# Patient Record
Sex: Female | Born: 1935 | Race: White | Hispanic: No | Smoking: Former smoker
Health system: Southern US, Community
[De-identification: ages and names within clinical notes are randomized; demographics above are authoritative.]

## PROBLEM LIST (undated history)

## (undated) DIAGNOSIS — I739 Peripheral vascular disease, unspecified: Secondary | ICD-10-CM

## (undated) DIAGNOSIS — J449 Chronic obstructive pulmonary disease, unspecified: Secondary | ICD-10-CM

## (undated) DIAGNOSIS — E785 Hyperlipidemia, unspecified: Secondary | ICD-10-CM

## (undated) DIAGNOSIS — I219 Acute myocardial infarction, unspecified: Secondary | ICD-10-CM

## (undated) DIAGNOSIS — I10 Essential (primary) hypertension: Secondary | ICD-10-CM

## (undated) DIAGNOSIS — I350 Nonrheumatic aortic (valve) stenosis: Secondary | ICD-10-CM

## (undated) HISTORY — DX: Essential (primary) hypertension: I10

## (undated) HISTORY — DX: Hyperlipidemia, unspecified: E78.5

## (undated) HISTORY — PX: ABDOMINAL HYSTERECTOMY: SHX81

## (undated) HISTORY — DX: Chronic obstructive pulmonary disease, unspecified: J44.9

## (undated) HISTORY — PX: CATARACT EXTRACTION: SUR2

## (undated) HISTORY — PX: FOOT SURGERY: SHX648

## (undated) HISTORY — PX: TONSILLECTOMY: SUR1361

## (undated) HISTORY — DX: Acute myocardial infarction, unspecified: I21.9

## (undated) HISTORY — DX: Peripheral vascular disease, unspecified: I73.9

## (undated) HISTORY — PX: APPENDECTOMY: SHX54

## (undated) HISTORY — PX: CAROTID ENDARTERECTOMY: SUR193

## (undated) HISTORY — DX: Nonrheumatic aortic (valve) stenosis: I35.0

---

## 2000-02-14 ENCOUNTER — Other Ambulatory Visit: Admission: RE | Admit: 2000-02-14 | Discharge: 2000-02-14 | Payer: Self-pay | Admitting: Gynecology

## 2002-09-30 ENCOUNTER — Ambulatory Visit (HOSPITAL_COMMUNITY): Admission: RE | Admit: 2002-09-30 | Discharge: 2002-09-30 | Payer: Self-pay | Admitting: Internal Medicine

## 2003-01-22 ENCOUNTER — Inpatient Hospital Stay (HOSPITAL_COMMUNITY): Admission: EM | Admit: 2003-01-22 | Discharge: 2003-01-23 | Payer: Self-pay | Admitting: Emergency Medicine

## 2003-01-22 ENCOUNTER — Encounter: Payer: Self-pay | Admitting: Internal Medicine

## 2003-01-23 ENCOUNTER — Encounter: Payer: Self-pay | Admitting: Cardiology

## 2003-03-25 ENCOUNTER — Ambulatory Visit (HOSPITAL_COMMUNITY): Admission: RE | Admit: 2003-03-25 | Discharge: 2003-03-26 | Payer: Self-pay | Admitting: *Deleted

## 2003-07-27 ENCOUNTER — Other Ambulatory Visit: Admission: RE | Admit: 2003-07-27 | Discharge: 2003-07-27 | Payer: Self-pay | Admitting: Gynecology

## 2004-07-28 ENCOUNTER — Other Ambulatory Visit: Admission: RE | Admit: 2004-07-28 | Discharge: 2004-07-28 | Payer: Self-pay | Admitting: Gynecology

## 2006-07-09 ENCOUNTER — Ambulatory Visit: Payer: Self-pay | Admitting: Vascular Surgery

## 2007-07-05 ENCOUNTER — Ambulatory Visit: Payer: Self-pay | Admitting: Vascular Surgery

## 2007-10-09 ENCOUNTER — Encounter: Admission: RE | Admit: 2007-10-09 | Discharge: 2007-10-09 | Payer: Self-pay | Admitting: Internal Medicine

## 2008-01-15 ENCOUNTER — Ambulatory Visit: Payer: Self-pay | Admitting: Vascular Surgery

## 2008-07-24 ENCOUNTER — Ambulatory Visit: Payer: Self-pay | Admitting: Vascular Surgery

## 2009-02-05 ENCOUNTER — Ambulatory Visit: Payer: Self-pay | Admitting: Vascular Surgery

## 2009-05-14 ENCOUNTER — Ambulatory Visit: Payer: Self-pay | Admitting: Vascular Surgery

## 2009-05-27 ENCOUNTER — Encounter: Payer: Self-pay | Admitting: Vascular Surgery

## 2009-05-27 ENCOUNTER — Ambulatory Visit: Payer: Self-pay | Admitting: Vascular Surgery

## 2009-05-27 ENCOUNTER — Inpatient Hospital Stay (HOSPITAL_COMMUNITY): Admission: RE | Admit: 2009-05-27 | Discharge: 2009-05-29 | Payer: Self-pay | Admitting: Vascular Surgery

## 2009-06-11 ENCOUNTER — Ambulatory Visit: Payer: Self-pay | Admitting: Vascular Surgery

## 2009-12-30 ENCOUNTER — Ambulatory Visit: Payer: Self-pay | Admitting: Vascular Surgery

## 2010-05-03 ENCOUNTER — Encounter: Payer: Self-pay | Admitting: Cardiology

## 2010-06-02 ENCOUNTER — Encounter: Payer: Self-pay | Admitting: Cardiology

## 2010-06-09 ENCOUNTER — Ambulatory Visit: Payer: Self-pay | Admitting: Cardiovascular Disease

## 2010-06-17 ENCOUNTER — Encounter: Payer: Self-pay | Admitting: Cardiology

## 2010-06-28 ENCOUNTER — Other Ambulatory Visit (INDEPENDENT_AMBULATORY_CARE_PROVIDER_SITE_OTHER): Payer: Medicare Other

## 2010-06-28 DIAGNOSIS — Z48812 Encounter for surgical aftercare following surgery on the circulatory system: Secondary | ICD-10-CM

## 2010-06-28 DIAGNOSIS — I6529 Occlusion and stenosis of unspecified carotid artery: Secondary | ICD-10-CM

## 2010-06-29 LAB — APTT: aPTT: 29 seconds (ref 24–37)

## 2010-06-29 LAB — CBC
HCT: 29.1 % — ABNORMAL LOW (ref 36.0–46.0)
HCT: 40.1 % (ref 36.0–46.0)
Hemoglobin: 13.8 g/dL (ref 12.0–15.0)
MCHC: 34.3 g/dL (ref 30.0–36.0)
MCV: 96.1 fL (ref 78.0–100.0)
Platelets: 236 10*3/uL (ref 150–400)
Platelets: 285 10*3/uL (ref 150–400)
RBC: 4.18 MIL/uL (ref 3.87–5.11)
RDW: 12 % (ref 11.5–15.5)
RDW: 12.6 % (ref 11.5–15.5)
WBC: 9.3 10*3/uL (ref 4.0–10.5)

## 2010-06-29 LAB — COMPREHENSIVE METABOLIC PANEL
ALT: 18 U/L (ref 0–35)
AST: 26 U/L (ref 0–37)
Albumin: 4.3 g/dL (ref 3.5–5.2)
Alkaline Phosphatase: 81 U/L (ref 39–117)
BUN: 15 mg/dL (ref 6–23)
CO2: 30 mEq/L (ref 19–32)
Calcium: 10.4 mg/dL (ref 8.4–10.5)
Chloride: 101 mEq/L (ref 96–112)
Creatinine, Ser: 0.85 mg/dL (ref 0.4–1.2)
GFR calc Af Amer: >60 mL/min (ref >60)
GFR calc non Af Amer: >60 mL/min (ref >60)
Glucose, Bld: 93 mg/dL (ref 70–99)
Potassium: 4.5 mEq/L (ref 3.5–5.1)
Sodium: 139 mEq/L (ref 135–145)
Total Bilirubin: 0.6 mg/dL (ref 0.3–1.2)
Total Protein: 7 g/dL (ref 6.0–8.3)

## 2010-06-29 LAB — BASIC METABOLIC PANEL
BUN: 10 mg/dL (ref 6–23)
GFR calc Af Amer: >60 mL/min (ref >60)
GFR calc non Af Amer: >60 mL/min (ref >60)
Potassium: 4 mEq/L (ref 3.5–5.1)
Sodium: 139 mEq/L (ref 135–145)

## 2010-06-29 LAB — CARDIAC PANEL(CRET KIN+CKTOT+MB+TROPI)
CK, MB: 1.7 ng/mL (ref 0.3–4.0)
Relative Index: INVALID (ref 0.0–2.5)
Total CK: 61 U/L (ref 7–177)
Troponin I: 0.01 ng/mL (ref 0.00–0.06)

## 2010-06-29 LAB — MRSA PCR SCREENING: MRSA by PCR: NEGATIVE

## 2010-06-29 LAB — URINALYSIS, ROUTINE W REFLEX MICROSCOPIC
Bilirubin Urine: NEGATIVE
Ketones, ur: NEGATIVE mg/dL
Nitrite: NEGATIVE
Protein, ur: NEGATIVE mg/dL
Urobilinogen, UA: 0.2 mg/dL (ref 0.0–1.0)

## 2010-06-29 LAB — GLUCOSE, CAPILLARY: Glucose-Capillary: 112 mg/dL — ABNORMAL HIGH (ref 70–99)

## 2010-06-29 LAB — PROTIME-INR: INR: 0.88 (ref 0.00–1.49)

## 2010-06-30 ENCOUNTER — Encounter: Payer: Self-pay | Admitting: Cardiology

## 2010-06-30 ENCOUNTER — Ambulatory Visit (INDEPENDENT_AMBULATORY_CARE_PROVIDER_SITE_OTHER): Payer: Medicare Other | Admitting: Cardiology

## 2010-06-30 VITALS — BP 142/76 | HR 74 | Resp 18 | Ht 63.0 in | Wt 112.0 lb

## 2010-06-30 DIAGNOSIS — R0602 Shortness of breath: Secondary | ICD-10-CM

## 2010-06-30 DIAGNOSIS — IMO0001 Reserved for inherently not codable concepts without codable children: Secondary | ICD-10-CM

## 2010-06-30 DIAGNOSIS — I350 Nonrheumatic aortic (valve) stenosis: Secondary | ICD-10-CM | POA: Insufficient documentation

## 2010-06-30 DIAGNOSIS — F17200 Nicotine dependence, unspecified, uncomplicated: Secondary | ICD-10-CM

## 2010-06-30 DIAGNOSIS — R0989 Other specified symptoms and signs involving the circulatory and respiratory systems: Secondary | ICD-10-CM | POA: Insufficient documentation

## 2010-06-30 DIAGNOSIS — Z720 Tobacco use: Secondary | ICD-10-CM | POA: Insufficient documentation

## 2010-06-30 DIAGNOSIS — E785 Hyperlipidemia, unspecified: Secondary | ICD-10-CM

## 2010-06-30 DIAGNOSIS — I10 Essential (primary) hypertension: Secondary | ICD-10-CM

## 2010-06-30 DIAGNOSIS — R011 Cardiac murmur, unspecified: Secondary | ICD-10-CM

## 2010-06-30 DIAGNOSIS — E119 Type 2 diabetes mellitus without complications: Secondary | ICD-10-CM | POA: Insufficient documentation

## 2010-06-30 DIAGNOSIS — I739 Peripheral vascular disease, unspecified: Secondary | ICD-10-CM

## 2010-06-30 NOTE — Patient Instructions (Addendum)
Your physician recommends that you schedule a follow-up appointment in: 4-6 weeks. Your physician has requested that you have an abdominal aorta duplex. During this test, an ultrasound is used to evaluate the aorta. Allow 30 minutes for this exam. Do not eat after midnight the day before and avoid carbonated beverages Your physician has requested that you have a lexiscan myoview. For further information please visit [email or web address] Please follow instruction sheet, as given.

## 2010-06-30 NOTE — Assessment & Plan Note (Signed)
Patient has an aortic stenosis murmur on examination. She is not having significant symptoms. She had a recent echocardiogram and I will have that forwarded to [place] for review. We will make further decisions concerning treatment and followup based on the results.

## 2010-06-30 NOTE — Assessment & Plan Note (Signed)
Most likely secondary to COPD. However multiple risk factors and previous vascular disease. Scheduled myoview.

## 2010-06-30 NOTE — Assessment & Plan Note (Addendum)
Continue aspirin and statin. Carotids followed by vascular surgery.

## 2010-06-30 NOTE — Assessment & Plan Note (Signed)
Blood pressure controlled on present medications. Will continue. 

## 2010-06-30 NOTE — Assessment & Plan Note (Signed)
Continue statin. Lipids and liver monitored by primary care. 

## 2010-06-30 NOTE — Assessment & Plan Note (Signed)
Patient counseled on discontinuing. 

## 2010-06-30 NOTE — Assessment & Plan Note (Signed)
Schedule abdominal ultrasound to exclude aneurysm. 

## 2010-06-30 NOTE — Progress Notes (Signed)
HPI: 77 year old female for evaluation of murmur. Echocardiogram in 2004 showed normal LV function, mild left atrial enlargement, mild mitral regurgitation. Carotid Dopplers in September of 2011 showed no stenosis in the right and the previous left carotid endarterectomy site was patent. Patient recently noted to have a murmur and we were asked to further evaluate. She has dyspnea on exertion which is a chronic issue and she states actually improving since decreasing her tobacco use. There is no orthopnea, PND, pedal edema, palpitations, syncope or chest pain. She apparently had an echocardiogram but those results are not available.  Current Outpatient Prescriptions  Medication Sig Dispense Refill  . albuterol (PROVENTIL HFA;VENTOLIN HFA) 108 (90 BASE) MCG/ACT inhaler Inhale 2 puffs into the lungs every 6 (six) hours as needed.        Marland Kitchen aspirin 325 MG tablet Take 325 mg by mouth daily.        Marland Kitchen atorvastatin (LIPITOR) 10 MG tablet Take 10 mg by mouth daily.        . Calcium Carbonate (CALTRATE 600 PO) Take by mouth. 1 po daily       . HYDROcodone-acetaminophen (NORCO) 7.5-325 MG per tablet Take 1 tablet by mouth every 6 (six) hours as needed.        Marland Kitchen LORazepam (ATIVAN) 0.5 MG tablet Take 0.5 mg by mouth every 6 (six) hours as needed.        . Lutein 20 MG CAPS Take by mouth. 2 po daily       . Multiple Vitamin (MULTIVITAMIN) capsule Take 1 capsule by mouth daily.        . Multiple Vitamins-Minerals (PRESERVISION AREDS PO) Take by mouth. 2 po daily       . Nebulizer MISC by Does not apply route. PRN       . olmesartan-hydrochlorothiazide (BENICAR HCT) 40-25 MG per tablet 1/2 po dialy       . Omega-3 Fatty Acids (FISH OIL) 1000 MG CAPS Take by mouth. 1 po daily         No Known Allergies  Past Medical History  Diagnosis Date  . HTN (hypertension)   . HLD (hyperlipidemia)   . Diabetes mellitus   . COPD (chronic obstructive pulmonary disease)   . PVD (peripheral vascular disease)     Past  Surgical History  Procedure Date  . Appendectomy   . Abdominal hysterectomy   . Cataract extraction   . Foot surgery     right  . Carotid endarterectomy     Left  . Tonsillectomy     History   Social History  . Marital Status: Widowed    Spouse Name: N/A    Number of Children: N/A  . Years of Education: N/A   Occupational History  . Retired    Social History Main Topics  . Smoking status: Current Everyday Smoker -- 0.5 packs/day for 60 years    Types: Cigarettes  . Smokeless tobacco: Not on file  . Alcohol Use: No  . Drug Use: No  . Sexually Active: Not on file   Other Topics Concern  . Not on file   Social History Narrative  . No narrative on file    Family History  Problem Relation Age of Onset  . Coronary artery disease Father   . Coronary artery disease Brother 23  . Coronary artery disease Brother 62    ROS: Arthralgias but no fevers or chills, productive cough, hemoptysis, dysphasia, odynophagia, melena, hematochezia, dysuria, hematuria, rash, seizure activity, orthopnea,  PND, pedal edema, claudication. Remaining systems are negative.  Physical Exam: General:  Well developed/frail in NAD Skin warm/dry Patient not depressed No peripheral clubbing Back-normal HEENT-normal/normal eyelids Neck supple/normal carotid upstroke bilaterally; bilateral bruits; no JVD; no thyromegaly chest - Mild exp wheeze; increased AP diameter CV - RRR/normal S1 and S2; no rubs or gallops;  PMI nondisplaced; 3/6 Systolic murmur Abdomen -NT/ND, no HSM, no mass, + bowel sounds, positive bruit 2+ femoral pulses, no bruits Ext-no edema, chords, 2+ DP Neuro-grossly nonfocal  ECG Normal sinus rhythm at a rate of 74. Normal axis. Cannot rule out prior septal infarct.

## 2010-07-05 ENCOUNTER — Encounter: Payer: Self-pay | Admitting: Cardiology

## 2010-07-05 NOTE — Procedures (Unsigned)
CAROTID DUPLEX EXAM  INDICATION:  Left carotid endarterectomy.  HISTORY: Diabetes:  No Cardiac:  No Hypertension:  Yes Smoking:  Yes Previous Surgery:  Left carotid endarterectomy on 05/27/2009 CV History:  Currently asymptomatic Amaurosis Fugax No, Paresthesias No, Hemiparesis No                                      RIGHT             LEFT Brachial systolic pressure:         172               170 Brachial Doppler waveforms:         Normal            Normal Vertebral direction of flow:        Antegrade         Antegrade DUPLEX VELOCITIES (cm/sec) CCA peak systolic                   71                65 ECA peak systolic                   95                94 ICA peak systolic                   108               92 ICA end diastolic                   24                27 PLAQUE MORPHOLOGY:                  Heterogeneous PLAQUE AMOUNT:                      Mild              None PLAQUE LOCATION:                    ICA/CCA  IMPRESSION: 1. No hemodynamically significant stenosis of the right internal     carotid artery, with plaque formations as described above. 2. Patent left carotid endarterectomy site with no evidence of     stenosis. 3. No significant change noted when compared to the previous exam on     12/30/2009.  ___________________________________________ Larina Earthly, M.D.  CH/MEDQ  D:  06/30/2010  T:  06/30/2010  Job:  [id number]

## 2010-07-20 ENCOUNTER — Encounter: Payer: Medicare Other | Admitting: Cardiology

## 2010-07-20 ENCOUNTER — Other Ambulatory Visit (HOSPITAL_COMMUNITY): Payer: Medicare Other | Admitting: Radiology

## 2010-07-28 ENCOUNTER — Other Ambulatory Visit (HOSPITAL_COMMUNITY): Payer: Medicare Other | Admitting: Radiology

## 2010-07-28 ENCOUNTER — Encounter: Payer: Medicare Other | Admitting: Cardiology

## 2010-08-08 ENCOUNTER — Ambulatory Visit: Payer: Medicare Other | Admitting: Cardiology

## 2010-08-09 ENCOUNTER — Telehealth: Payer: Self-pay | Admitting: Cardiology

## 2010-08-09 DIAGNOSIS — R011 Cardiac murmur, unspecified: Secondary | ICD-10-CM

## 2010-08-09 NOTE — Telephone Encounter (Signed)
Pt having problems with bronchitis, was put on 5 day treatment of prednisone starting today, has stress test Tomorrow, can she still have this done

## 2010-08-09 NOTE — Telephone Encounter (Signed)
Pt coughing a lot and doesn't feel well.  Transferred call to Nuc Med dept to re-shchedule appt.

## 2010-08-10 ENCOUNTER — Encounter: Payer: Medicare Other | Admitting: Cardiology

## 2010-08-10 ENCOUNTER — Other Ambulatory Visit (HOSPITAL_COMMUNITY): Payer: Medicare Other | Admitting: Radiology

## 2010-08-16 ENCOUNTER — Ambulatory Visit: Payer: Medicare Other | Admitting: Cardiology

## 2010-08-17 ENCOUNTER — Ambulatory Visit (HOSPITAL_COMMUNITY): Payer: Medicare Other | Attending: Cardiology | Admitting: Radiology

## 2010-08-17 ENCOUNTER — Encounter (INDEPENDENT_AMBULATORY_CARE_PROVIDER_SITE_OTHER): Payer: Medicare Other | Admitting: Cardiology

## 2010-08-17 VITALS — Ht 63.0 in | Wt 109.0 lb

## 2010-08-17 DIAGNOSIS — I10 Essential (primary) hypertension: Secondary | ICD-10-CM | POA: Insufficient documentation

## 2010-08-17 DIAGNOSIS — R0989 Other specified symptoms and signs involving the circulatory and respiratory systems: Secondary | ICD-10-CM

## 2010-08-17 DIAGNOSIS — F17200 Nicotine dependence, unspecified, uncomplicated: Secondary | ICD-10-CM | POA: Insufficient documentation

## 2010-08-17 DIAGNOSIS — I700 Atherosclerosis of aorta: Secondary | ICD-10-CM

## 2010-08-17 DIAGNOSIS — R0602 Shortness of breath: Secondary | ICD-10-CM

## 2010-08-17 DIAGNOSIS — I739 Peripheral vascular disease, unspecified: Secondary | ICD-10-CM | POA: Insufficient documentation

## 2010-08-17 DIAGNOSIS — I714 Abdominal aortic aneurysm, without rupture: Secondary | ICD-10-CM

## 2010-08-17 DIAGNOSIS — Z8249 Family history of ischemic heart disease and other diseases of the circulatory system: Secondary | ICD-10-CM | POA: Insufficient documentation

## 2010-08-17 DIAGNOSIS — R0600 Dyspnea, unspecified: Secondary | ICD-10-CM

## 2010-08-17 DIAGNOSIS — I779 Disorder of arteries and arterioles, unspecified: Secondary | ICD-10-CM | POA: Insufficient documentation

## 2010-08-17 DIAGNOSIS — R42 Dizziness and giddiness: Secondary | ICD-10-CM | POA: Insufficient documentation

## 2010-08-17 DIAGNOSIS — R0609 Other forms of dyspnea: Secondary | ICD-10-CM | POA: Insufficient documentation

## 2010-08-17 MED ORDER — TECHNETIUM TC 99M TETROFOSMIN IV KIT
11.0000 | PACK | Freq: Once | INTRAVENOUS | Status: AC | PRN
  Administered 2010-08-17: 11 via INTRAVENOUS

## 2010-08-17 MED ORDER — TECHNETIUM TC 99M TETROFOSMIN IV KIT
33.0000 | PACK | Freq: Once | INTRAVENOUS | Status: AC | PRN
  Administered 2010-08-17: 33 via INTRAVENOUS

## 2010-08-17 MED ORDER — REGADENOSON 0.4 MG/5ML IV SOLN
0.4000 mg | Freq: Once | INTRAVENOUS | Status: AC
  Administered 2010-08-17: 0.4 mg via INTRAVENOUS

## 2010-08-17 NOTE — Progress Notes (Signed)
[facility] SITE 3 NUCLEAR MED [address] [place] [address] [phone]  Cardiology Nuclear Med Study  Denise Adams is a 77 y.o. female [id number] 01/19/1932   Nuclear Med Background Indication for Stress Test:  Evaluation for Ischemia History: '04 Echo: NL LVF mild MR, AS Cardiac Risk Factors: Carotid Disease, Family History - CAD, Hypertension, Lipids, PVD and Smoker  Symptoms:  Dizziness, DOE and Light-Headedness   Nuclear Pre-Procedure Caffeine/Decaff Intake:  None NPO After: 8:00pm   Lungs:  Clear with minimal rhonchi IV 0.9% NS with Angio Cath:  18g  IV Site: R Antecubital  IV Started by:  Stanton Kidney, EMT-P  Chest Size (in):  34 Cup Size: C  Height: 5\' 3"  (1.6 m)  Weight:  109 lb (49.442 kg)  BMI:  Body mass index is 19.31 kg/(m^2). Tech Comments:  NA    Nuclear Med Study 1 or 2 day study: 1 day  Stress Test Type:  Eugenie Birks  Reading MD: Marca Ancona, MD  Order Authorizing Provider:  B.Crenshaw  Resting Radionuclide: Technetium 58m Tetrofosmin  Resting Radionuclide Dose: 11 mCi   Stress Radionuclide:  Technetium 51m Tetrofosmin  Stress Radionuclide Dose: 33 mCi           Stress Protocol Rest HR: 71 Stress HR: 112  Rest BP: 155/62 Stress BP: 153/62  Exercise Time (min): n/a METS: n/a   Predicted Max HR: 144 bpm % Max HR: 77.78 bpm Rate Pressure Product: 56433   Dose of Adenosine (mg):  n/a Dose of Lexiscan: 0.4 mg  Dose of Atropine (mg): n/a Dose of Dobutamine: n/a mcg/kg/min (at max HR)  Stress Test Technologist: Milana Na, EMT-P  Nuclear Technologist:  Domenic Polite, CNMT     Rest Procedure:  Myocardial perfusion imaging was performed at rest 45 minutes following the intravenous administration of Technetium 39m Tetrofosmin. Rest ECG: NSR  Stress Procedure:  The patient received IV Lexiscan 0.4 mg over 15-seconds.  Technetium 75m Tetrofosmin injected at 30-seconds.  There were no significant changes with Lexiscan.   Quantitative spect images were obtained after a 45 minute delay. Stress ECG: No significant change from baseline ECG  QPS Raw Data Images:  Normal; no motion artifact; normal heart/lung ratio. Stress Images:  Small apical perfusion defect.  Rest Images:  Normal homogeneous uptake in all areas of the myocardium. Subtraction (SDS):  Small reversible apical perfusion defect.  Transient Ischemic Dilatation (Normal <1.22):  1.10 Lung/Heart Ratio (Normal <0.45):  0.37  Quantitative Gated Spect Images QGS EDV:  139 ml QGS ESV:  70 ml QGS cine images:  Normal Wall Motion QGS EF: 76%  Impression Exercise Capacity:  Lexiscan with no exercise. BP Response:  Normal blood pressure response. Clinical Symptoms:  Chest tightness and nausea.  ECG Impression:  No significant ST segment change suggestive of ischemia. Comparison with Prior Nuclear Study: No images to compare  Overall Impression:  Small reversible apical perfusion defect.  Normal LV systolic function.  Overall low risk: shifting breast attenuation versus small area of ischemia.   Dalton [employer]

## 2010-08-18 ENCOUNTER — Encounter: Payer: Self-pay | Admitting: Cardiology

## 2010-08-18 NOTE — Progress Notes (Signed)
COPY ROUTED TO DR. CRENSHAW

## 2010-08-23 ENCOUNTER — Ambulatory Visit (INDEPENDENT_AMBULATORY_CARE_PROVIDER_SITE_OTHER): Payer: Medicare Other | Admitting: Cardiology

## 2010-08-23 ENCOUNTER — Encounter: Payer: Self-pay | Admitting: Cardiology

## 2010-08-23 VITALS — BP 134/68 | HR 78 | Ht 63.0 in | Wt 107.6 lb

## 2010-08-23 DIAGNOSIS — R9430 Abnormal result of cardiovascular function study, unspecified: Secondary | ICD-10-CM | POA: Insufficient documentation

## 2010-08-23 DIAGNOSIS — I739 Peripheral vascular disease, unspecified: Secondary | ICD-10-CM

## 2010-08-23 DIAGNOSIS — Z720 Tobacco use: Secondary | ICD-10-CM

## 2010-08-23 DIAGNOSIS — I714 Abdominal aortic aneurysm, without rupture, unspecified: Secondary | ICD-10-CM | POA: Insufficient documentation

## 2010-08-23 DIAGNOSIS — F17200 Nicotine dependence, unspecified, uncomplicated: Secondary | ICD-10-CM

## 2010-08-23 DIAGNOSIS — E7800 Pure hypercholesterolemia, unspecified: Secondary | ICD-10-CM

## 2010-08-23 MED ORDER — PRAVASTATIN SODIUM 40 MG PO TABS: 40.0000 mg | ORAL_TABLET | Freq: Every evening | ORAL | Status: DC

## 2010-08-23 NOTE — Assessment & Plan Note (Signed)
OFFICE VISIT   Denise Adams, Denise Adams  DOB:  Sep 08, 1931                                       02/05/2009  ZOXWR#:[id number]   Patient presents today for continued follow-up of her asymptomatic  carotid disease.  She specifically denies any new major medical  difficulties.  Specifically, no amaurosis fugax, transient ischemic  attack, or stroke.  She has stable treatment of her diabetes,  hypertension, and elevated cholesterol.  She does have a recent  worsening of her chronic bronchitis due to a probable upper respiratory  infection.   Her social history is unchanged.  She continues to smoke a pack of  cigarettes a day.  Does not drink alcohol on a regular basis.   Review of systems is positive for shortness of breath with exertion from  a cardiac standpoint and no history of weight loss or weight gain.  Pulmonary:  Does have productive cough and wheezing.  GI:  Negative.  GU:  Negative.  Vascular:  She does have pain in her legs with walking.  She denies any neurologic deficits.  She does have arthritic joint pain.  No psychiatric change.  No hematologic disorders.   Her medication list is on her chart.   PHYSICAL EXAMINATION:  A well-developed and well-nourished white female  in no acute distress, grossly intact neurologically.  Her radial pulses  are 2+ bilaterally.  She does have a harsh left carotid bruit, no bruit  on the right.  She is intact neurologically.   She underwent repeat carotid duplex today, and this shows progression to  a severe level of stenosis in her left internal carotid artery.  She is  right-handed.   I discussed this at length with patient and her family present.  I  explained the significance of her severe asymptomatic stenosis and have  suggested elective carotid endarterectomy.  She reports that she is  having a family member visit from the [employer] after being absent for 4  years in mid December and wishes to defer until  after this.  I explained  that annual risk of stroke or transient ischemic attack would be  approximately 5% with the severe asymptomatic carotid stenosis.  I  explained the symptoms to her, and she will notify me should this occur,  otherwise we will see her again the first of the year for further  discussion.   Larina Earthly, M.D.  Electronically Signed   TFE/MEDQ  D:  02/05/2009  T:  02/08/2009  Job:  [id number]   cc:   Larina Earthly, M.D.

## 2010-08-23 NOTE — Progress Notes (Signed)
HPI: Pleasant female I saw in March of 2012 for evaluation of murmur. Echocardiogram in 2004 showed normal LV function, mild left atrial enlargement, mild mitral regurgitation. Carotid Dopplers in September of 2011 showed no stenosis in the right and the previous left carotid endarterectomy site was patent. Echocardiogram in January of 2012 showed normal LV function, moderate aortic stenosis with a mean gradient of 27 mmHg, mild aortic insufficiency, mild to moderate tricuspid regurgitation. Patient had a Myoview in May 2012. Ejection fraction was 76%. There was a small reversible apical defect felt to be either mild ischemia or shifting breast attenuation. Abdominal ultrasound in May of 2012 showed mild dilatation measuring 2.6 x 2.7 cm. There was severe in-stent restenosis of the right iliac stent. ABIs and clinical correlation recommended. Since I last saw her, she continues to have dyspnea on exertion but there is no orthopnea, PND or pedal edema. She has not had exertional chest pain. There is no syncope. She has mild discomfort in her legs bilaterally with walking up a hill.   Current Outpatient Prescriptions  Medication Sig Dispense Refill  . albuterol (PROVENTIL HFA;VENTOLIN HFA) 108 (90 BASE) MCG/ACT inhaler Inhale 2 puffs into the lungs every 6 (six) hours as needed.        Marland Kitchen aspirin 325 MG tablet Take 325 mg by mouth daily.        . Calcium Carbonate (CALTRATE 600 PO) Take by mouth. 1 po daily       . cyanocobalamin 1000 MCG tablet Take 100 mcg by mouth daily.        Marland Kitchen HYDROcodone-acetaminophen (NORCO) 7.5-325 MG per tablet Take 1 tablet by mouth every 6 (six) hours as needed.        Marland Kitchen LORazepam (ATIVAN) 0.5 MG tablet Take 0.5 mg by mouth every 6 (six) hours as needed.        . Lutein 20 MG CAPS Take 45 mg by mouth daily. 2 po daily      . Multiple Vitamin (MULTIVITAMIN) capsule Take 1 capsule by mouth daily.        . Multiple Vitamins-Minerals (PRESERVISION AREDS PO) Take by mouth. 2 po  daily       . Nebulizer MISC by Does not apply route. PRN       . olmesartan-hydrochlorothiazide (BENICAR HCT) 40-25 MG per tablet 1/2 po dialy       . Omega-3 Fatty Acids (FISH OIL) 1000 MG CAPS Take by mouth. 1 po daily.  Mega [employer] Kr[employer] Oil      . DISCONTD: atorvastatin (LIPITOR) 10 MG tablet Take 10 mg by mouth daily.           Past Medical History  Diagnosis Date  . HTN (hypertension)   . HLD (hyperlipidemia)   . Diabetes mellitus   . COPD (chronic obstructive pulmonary disease)   . PVD (peripheral vascular disease)   . Aortic stenosis     Past Surgical History  Procedure Date  . Appendectomy   . Abdominal hysterectomy   . Cataract extraction   . Foot surgery     right  . Carotid endarterectomy     Left  . Tonsillectomy     History   Social History  . Marital Status: Widowed    Spouse Name: N/A    Number of Children: N/A  . Years of Education: N/A   Occupational History  . Retired    Social History Main Topics  . Smoking status: Current Everyday Smoker -- 0.5 packs/day for 60  years    Types: Cigarettes  . Smokeless tobacco: Never Used  . Alcohol Use: No  . Drug Use: No  . Sexually Active: Not on file   Other Topics Concern  . Not on file   Social History Narrative  . No narrative on file    ROS: Problems with arthralgias and allergies but no fevers or chills, productive cough, hemoptysis, dysphasia, odynophagia, melena, hematochezia, dysuria, hematuria, rash, seizure activity, orthopnea, PND, pedal edema. Remaining systems are negative.  Physical Exam: Well-developed well-nourished in no acute distress.  Skin is warm and dry.  HEENT is normal.  Neck is supple. No thyromegaly.  Chest is diffuse rhonchi Cardiovascular exam is regular rate and rhythm. 3/6 systolic murmur left sternal border. Abdominal exam nontender or distended. No masses palpated. Extremities show no edema. neuro grossly intact

## 2010-08-23 NOTE — Procedures (Signed)
CAROTID DUPLEX EXAM   INDICATION:  Followup carotid artery disease.   HISTORY:  Diabetes:  Yes.  Cardiac:  No.  Hypertension:  Yes.  Smoking:  Yes.  Previous Surgery:  No carotid surgery.  CV History:  No.  Amaurosis Fugax No, Paresthesias No, Hemiparesis No                                       RIGHT             LEFT  Brachial systolic pressure:         142               140  Brachial Doppler waveforms:         Biphasic          Biphasic  Vertebral direction of flow:        Antegrade         Antegrade  DUPLEX VELOCITIES (cm/sec)  CCA peak systolic                   94                90  ECA peak systolic                   112               125  ICA peak systolic                   116               274  ICA end diastolic                   44                75  PLAQUE MORPHOLOGY:                  Calcified         Calcified  PLAQUE AMOUNT:                      Mild              Moderate/severe  PLAQUE LOCATION:                    ICA/bifurcation   ICA/bifurcation   IMPRESSION:  1. Right ICA shows evidence of 40-59% stenosis (low end of range)      which is an increase in category, however, minimal velocity change.  2. Left ICA shows evidence of 60-79% stenosis, showing no significant      change from previous study.      ___________________________________________  Larina Earthly, M.D.   AS/MEDQ  D:  01/15/2008  T:  01/15/2008  Job:  [id number]

## 2010-08-23 NOTE — Patient Instructions (Signed)
Your physician wants you to follow-up in: 6 MONTHS You will receive a reminder letter in the mail two months in advance. If you don't receive a letter, please call our office to schedule the follow-up appointment.   Your physician has requested that you have a lower extremity arterial duplex. This test is an ultrasound of the arteries in the legs or arms. It looks at arterial blood flow in the legs and arms. Allow one hour for Lower and Upper Arterial scans. There are no restrictions or special instructions   START PRAVASTATIN 40 MG ONCE DAILY AT BEDTIME  Your physician recommends that you return for a FASTING lipid profile: 6 WEEKS=LAST WEEK OF [place]

## 2010-08-23 NOTE — Procedures (Signed)
CAROTID DUPLEX EXAM   INDICATION:  Carotid disease.   HISTORY:  Diabetes:  No.  Cardiac:  No.  Hypertension:  Yes.  Smoking:  Yes.  Previous Surgery:  No.  CV History:  Asymptomatic.  Amaurosis Fugax No, Paresthesias No, Hemiparesis No                                       RIGHT             LEFT  Brachial systolic pressure:         139               146  Brachial Doppler waveforms:         Normal            Normal  Vertebral direction of flow:        Antegrade         Antegrade  DUPLEX VELOCITIES (cm/sec)  CCA peak systolic                   91                85  ECA peak systolic                   108               103  ICA peak systolic                   124               217  ICA end diastolic                   31                69  PLAQUE MORPHOLOGY:                  Mixed             Mixed  PLAQUE AMOUNT:                      Mild              Moderate  PLAQUE LOCATION:                    ICA/ECA/CCA       ICA/ECA/CCA   IMPRESSION:  1. 40-59% stenosis of the right internal carotid artery.  2. 60-79% stenosis of the left internal carotid artery.  3. No significant change noted when compared to the previous exam on      01/15/2008.   ___________________________________________  Larina Earthly, M.D.   CH/MEDQ  D:  07/24/2008  T:  07/24/2008  Job:  [id number]

## 2010-08-23 NOTE — Procedures (Signed)
CAROTID DUPLEX EXAM   INDICATION:  Follow up carotid artery disease.   HISTORY:  Diabetes:  Yes.  Cardiac:  No.  Hypertension:  Yes.  Smoking:  Yes.  Previous Surgery:  No carotid surgery.  CV History:  Asymptomatic.  Amaurosis Fugax No, Paresthesias No, Hemiparesis No                                       RIGHT             LEFT  Brachial systolic pressure:         140               130  Brachial Doppler waveforms:         Biphasic          Biphasic  Vertebral direction of flow:        Antegrade         Antegrade  DUPLEX VELOCITIES (cm/sec)  CCA peak systolic                   95                73  ECA peak systolic                   99                108  ICA peak systolic                   101               280  ICA end diastolic                   31                89  PLAQUE MORPHOLOGY:                  Calcified         Calcified  PLAQUE AMOUNT:                      Mild              Moderate/severe  PLAQUE LOCATION:                    ICA               ICA   IMPRESSION:  1. Right ICA stenosis of 20-39%.  2. Left ICA stenosis of 60-79%, showing an increase from previous      study.  3. Dr. Arbie Cookey was notified.     ___________________________________________  Larina Earthly, M.D.   AS/MEDQ  D:  07/05/2007  T:  07/05/2007  Job:  [id number]

## 2010-08-23 NOTE — Procedures (Signed)
CAROTID DUPLEX EXAM   INDICATION:  Followup left carotid endarterectomy.   HISTORY:  Diabetes:  No.  Cardiac:  No.  Hypertension:  Yes.  Smoking:  Yes.  Previous Surgery:  Left carotid endarterectomy on 05/27/2009.  CV History:  Currently asymptomatic.  Amaurosis Fugax No, Paresthesias No, Hemiparesis No                                       RIGHT             LEFT  Brachial systolic pressure:         144               140  Brachial Doppler waveforms:         Normal            Normal  Vertebral direction of flow:        Antegrade         Antegrade  DUPLEX VELOCITIES (cm/sec)  CCA peak systolic                   87                71  ECA peak systolic                   87                88  ICA peak systolic                   89                85  ICA end diastolic                   22                26  PLAQUE MORPHOLOGY:                  Heterogeneous  PLAQUE AMOUNT:                      Mild              None  PLAQUE LOCATION:                    ICA / CCA   IMPRESSION:  1. No hemodynamically significant stenosis of the right internal      carotid artery with a mild plaque formation noted.  2. Patent left carotid endarterectomy site with no left internal      carotid artery stenosis.  3. Significant improvement of left internal carotid artery Doppler      velocity when compared to the previous exam on 02/05/2009 with the      right internal carotid artery remaining stable.   ___________________________________________  Larina Earthly, M.D.   CH/MEDQ  D:  12/30/2009  T:  12/30/2009  Job:  [id number]

## 2010-08-23 NOTE — Assessment & Plan Note (Signed)
Check ABIs. We'll most likely managed medically as her symptoms are limited to walking up hills at present.

## 2010-08-23 NOTE — Assessment & Plan Note (Signed)
Lipitor discontinued for financial reasons. We'll add Pravachol 40 mg daily and check lipids and liver in 6 weeks.

## 2010-08-23 NOTE — Assessment & Plan Note (Signed)
OFFICE VISIT   Denise Adams, Denise Adams  DOB:  1931-05-04                                       06/11/2009  ZOXWR#:[id number]   The patient presents today for followup of her recent left carotid  endarterectomy and Dacron patch angioplasty for severe asymptomatic  internal carotid stenosis.  She did stay an additional day in the  hospital due to fluctuation in her blood pressure.  She had no  neurologic deficits and was discharged to home.  She continues to do  quite well.  She reports some mild peri-incisional soreness and headache  but no focal deficits.  Her incision is healing quite nicely.  She has  no bruit on the left side.  She is instructed to return to full activity  without limitation and plan to see her again in 6 months with repeat  carotid duplex at that time.     Larina Earthly, M.D.  Electronically Signed   TFE/MEDQ  D:  06/11/2009  T:  06/14/2009  Job:  [id number]   cc:   Larina Earthly, M.D.

## 2010-08-23 NOTE — Assessment & Plan Note (Signed)
Repeat abdominal ultrasound May 2013.

## 2010-08-23 NOTE — Assessment & Plan Note (Signed)
No symptoms at present. Followup echocardiogram January 2013.

## 2010-08-23 NOTE — Assessment & Plan Note (Signed)
Myoview low risk. No symptoms. Medical therapy for now.

## 2010-08-23 NOTE — Assessment & Plan Note (Signed)
OFFICE VISIT   Denise Adams, Denise Adams  DOB:  1931/05/25                                       05/14/2009  YQMVH#:[id number]   The patient presents today for continued followup of her asymptomatic  severe left internal carotid artery stenosis.  She had been seen in  October 2010 with a duplex showing progression to critical level of  stenosis in her left internal carotid artery.  She had been followed for  approximately 3-4 years with progressive changes.  She had no neurologic  deficits.  At that time, we had recommended endarterectomy, but she had  a family member coming in from out of town over the holidays and wished  to defer.  I am seeing her today for further discussion.  She has had no  change in her overall medical condition.  She does have no symptoms  specifically of amaurosis fugax, transient ischemic attack, or stroke.  She does have stable treatment of her hypertension, diabetes, and  elevated cholesterol.  She does have chronic bronchitis due to chronic  COPD.  She unfortunately does continue to smoke 1 pack of cigarettes per  day.  She is widowed.  She does take 1 aspirin per day.   PHYSICAL EXAM:  Well-developed, well-nourished, white female appearing  stated age of 73.  Her blood pressure is 164/76, respirations 18, heart  rate 92, temperature is 97.6.  She is in no acute distress and is well  nourished.  HEENT is normal.  Lungs:  Clear bilaterally with no rales,  rhonchi, or wheezes.  Cardiovascular:  Her carotid does reveal left  carotid bruit.  Heart is regular rate and rhythm.  She does have 2+  radial and 2+ dorsalis pedis pulses bilaterally.  Musculoskeletal:  No  major deformities or cyanosis.  Neurologic:  No focal weakness or  paresthesias.  Skin:  No ulcers or rashes.   I reviewed her duplex with her, again recommending endarterectomy for  reduction of stroke risk.  I explained the procedure to include possible  stroke with surgery  and also possibility of neurologic cranial nerve  injury.  I explained that the risk of this should be in the 1% to 2%  range.  She understands and wishes to proceed with surgery.  We will  schedule this on 2/17 at [facility].     Larina Earthly, M.D.  Electronically Signed   TFE/MEDQ  D:  05/14/2009  T:  05/17/2009  Job:  [id number]

## 2010-08-23 NOTE — Assessment & Plan Note (Signed)
Patient counseled on discontinuing. 

## 2010-08-23 NOTE — Procedures (Signed)
CAROTID DUPLEX EXAM   INDICATION:  Followup of carotid disease.   HISTORY:  Diabetes:  no  Cardiac:  no  Hypertension:  yes  Smoking:  yes  Previous Surgery:  no  CV History:  asymptomatic  Amaurosis Fugax No, Paresthesias No, Hemiparesis No                                       RIGHT             LEFT  Brachial systolic pressure:         110               114  Brachial Doppler waveforms:         triphasic         triphasic  Vertebral direction of flow:        antegrade         antegrade  DUPLEX VELOCITIES (cm/sec)  CCA peak systolic                   110               58  ECA peak systolic                   108               128  ICA peak systolic                   116               368  ICA end diastolic                   49                121  PLAQUE MORPHOLOGY:                  mixed             calcified  PLAQUE AMOUNT:                      mild              severe  PLAQUE LOCATION:                    CCA, ICA          ICA, CCA   IMPRESSION:  1. 80-99% stenosis of the left internal carotid artery.  2. 20-39% stenosis noted on the right internal carotid artery.        ___________________________________________  Larina Earthly, M.D.   CJ/MEDQ  D:  02/05/2009  T:  02/06/2009  Job:  [id number]

## 2010-08-23 NOTE — Assessment & Plan Note (Signed)
Blood pressure controlled. Continue present medications. 

## 2010-08-24 NOTE — Progress Notes (Signed)
pt aware of results, follow up appt made. Echo results under procedures in pt chart Denise Adams

## 2010-08-26 NOTE — H&P (Signed)
NAME:  Denise Adams, Denise Adams                     ACCOUNT NO.:  [id number]   MEDICAL RECORD NO.:  [id number]                   PATIENT TYPE:  INP   LOCATION:  2028                                 FACILITY:  [facility]   PHYSICIAN:  Mark A. Perini, M.D.                DATE OF BIRTH:  12-08-1931   DATE OF ADMISSION:  01/22/2003  DATE OF DISCHARGE:                                HISTORY & PHYSICAL   CHIEF COMPLAINT:  Acute onset of right arm pain and numbness.   HISTORY OF PRESENT ILLNESS:  Ms. Bacorn is a 77 year old Caucasian female  with a past medical history significant for significant hyperlipidemia,  hypertension, tobacco abuse, family history of coronary artery disease,  COPD, and Type II diabetes mellitus who was in her usual state of health  until today.  This morning she developed the acute onset of right hand  numbness and tingling, and it felt like it was going to sleep.  There was no  definite weakness, but it did  feel numb.  This lasted approximately 20  minutes and then eased off.  It also did involve some right arm discomfort  between the elbow and shoulder.  She was brushing her hair when this  happened.  Thirty to 45 minutes later the symptoms returned when she was  sitting at rest.  This time it felt more numb and was not burning so much.  It involved the right arm and right forearm, and there was an aching  feeling.  This eased off and she decided to call our office.  She was set up  for a visit.  In our waiting room she had several more brief episodes of  similar symptoms.  She denies any recent fevers or chills.  She denies any  chest pains, exertional or otherwise.  She denies any new edema or new  shortness of breath or wheezing.  She denies any palpitations.  She stills  smokes a half pack of cigarettes a day.  She does have a history of right  rotator cuff tear and had a steroid shot in that area in March 2004.  She  just recently resumed her Lipitor when a recent  cholesterol check showed  severe elevation of her LDL.  She has no previous history of coronary  disease or stroke.  She did have a right foot procedure performed eight  weeks ago.   In the office there were no significant findings on exam and EKG was  relatively unremarkable; however, given her significant risk factors for  vascular disease it was felt that she should be admitted for further  evaluation and treatment.   PAST MEDICAL HISTORY:  1. Tobacco abuse.  2. Type II diabetes mellitus since 2001.  3. Hypertension.  4. Hyperlipidemia.  5. Previous right shoulder procedure.  6. Bronchitis in 1974.  7. COPD.  8. Partial hysterectomy in 1967.  9. Positive family history for early  coronary disease.  10.      Osteoporosis.   SOCIAL HISTORY:  The patient is married for over 43 years.  Her husband is  supportive.  She does have a daughter that lives with her with cerebral  palsy and she is very concerned for her daughter's welfare.  She is retired.  She denies any alcohol or drug use.   FAMILY HISTORY:  Father died at age 58 of coronary disease and MI.  Mother  died at age 39 of cerebrovascular disease and diabetes.  She had a brother  who died at age 71 of coronary disease, a living sister with diabetes and  another brother died of a gunshot wound.  The patient has three daughters;  one with diabetes.   There is no history of any previous cardiac evaluation or vascular  evaluation noted.   PHYSICAL EXAMINATION:  VITAL SIGNS:  Temperature; afebrile.  Blood pressure  126/62, pulse 68 wand weight 140 pounds.  GENERAL APPEARANCE:  The patient is in no acute distress.  HEENT:  Normocephalic and atraumatic.  She smells of smoke.  She has no  icterus.  Pupils are equally round and react to light.  Oropharynx is clear.  NECK:  There is no thyromegaly.  No carotid bruits.  No JVD.  LUNGS:  Lungs are clear to auscultation bilaterally with no wheezes, rales  or rhonchi.  HEART:  Heart  has a regular rate and rhythm with a 1-2/6 murmur at the left  sternal border in systole.  There is no peripheral edema.  ABDOMEN:  Abdominal exam is negative.  NEUROLOGIC EXAMINATION:  The patient is neurologically intact wit normal  deep tendon reflexes in the upper and lower extremities.  There is no clear  weakness on neurologic testing noted.   LABORATORY DATA:  Pending.   ALLERGIES:  No known drug allergies.   MEDICATIONS:  Current medications include:  1. Lipitor 10 mg each H.S.  2. HCTZ 12.5 mg daily.  3. Albuterol MDI as needed.  4. Synthroid was stopped two months ago.  5. The patient uses Flonase or Rhinocort Aqua as needed.  6. Clarinex as needed.  7. Protonix 40 mg as needed.  8. Pepcid as needed.  9. Tylenol as needed.  10.      Darvocet as needed.  11.      Histinex as needed.  12.      The patient stopped Co-enzyme Q10 two months ago.  13.      The patient takes aspirin 325 mg daily.  14.      Albuterol nebulizer as needed.  15.      Centrum Silver daily.   ASSESSMENT/PLAN:  [age]-year-old female with multiple cardiovascular  risk factors who presents with acute onset of right arm pain and numbness.  This could all be an orthopedic problem related to neck degenerative disease  or shoulder pathology, or even possibly to carpal tunnel syndrome; however,  given the acute onset and pattern, and her multiple risk factors I believe  that she needs to be ruled out for a vascular cause such as ischemic cardiac  condition or transient ischemic attacks.   The patient will be admitted to a telemetry bed.  She will have oxygen and  IV access.  We will obtain admission laboratory data and chest x-ray.  We  will obtain an MRI of the brain and carotid circulation, and MRA of the  brain.  We will ask cardiology to consult.  We will continue  her home  medications and place on sliding scale of insulin.  She is a Full Code  Status.                                                  Mark A. Waynard Edwards, M.D.    MAP/MEDQ  D:  01/22/2003  T:  01/23/2003  Job:  [id number]   cc:   Olga Millers, M.D.

## 2010-08-26 NOTE — Consult Note (Signed)
NAME:  Denise Adams, Denise Adams                     ACCOUNT NO.:  [id number]   MEDICAL RECORD NO.:  [id number]                   PATIENT TYPE:  INP   LOCATION:  2028                                 FACILITY:  [facility]   PHYSICIAN:  Olga Millers, M.D.                DATE OF BIRTH:  1931-07-05   DATE OF CONSULTATION:  01/22/2003  DATE OF DISCHARGE:                                   CONSULTATION   HISTORY OF PRESENT ILLNESS:  The patient is a 77 year old female with no  prior cardiac history, who we were asked to evaluate for right arm numbness  and pain.  The patient typically does have chronic dyspnea on exertion.  However, there is no orthopnea, PND, pedal edema, syncope, or exertional  chest pain.  This morning, she noted sudden onset of right upper extremity  aching/burning and numbness.  There was no dysarthria, weakness or numbness  in other extremities, or incontinence.  The symptoms lasted for  approximately two hours and resolved spontaneously.  She was brought to the  emergency room and admitted for further evaluation, including rule out  myocardial infarction.   PAST MEDICAL HISTORY:  Significant for:  1. Hypertension.  2. Hyperlipidemia.  3. Diet-controlled diabetes mellitus.  4. History of hypothyroidism, but is not on therapy at present.  5. History of emphysema.   PAST SURGICAL HISTORY:  1. Appendectomy.  2. Hysterectomy.  3. Right foot surgery eight weeks.  4. Cataract repair.  5. Right rotator cuff repair.   SOCIAL HISTORY:  She does smoke, but does not consume alcohol.   FAMILY HISTORY:  Positive for coronary artery disease.   MEDICATIONS:  1. Lipitor 10 mg p.o. daily.  2. Hydrochlorothiazide 12.5 mg p.o. daily.  3. Aspirin.  4. Darvocet.  5. Centrum Silver multivitamins.  6. Albuterol.   ALLERGIES:  She has no known drug allergies.   REVIEW OF SYSTEMS:  She denies any headaches, fevers, or chills.  There is  no productive cough or hemoptysis.  She does  have chronic dyspnea on  exertion.  There is dysphagia, odynophagia, melena, or hematochezia.  There  is no dysuria or hematuria.  There is no rash or seizure activity.  There is  some numbness in her right lower extremity with ambulation.  The remaining  systems are negative.   PHYSICAL EXAMINATION:  VITAL SIGNS:  The physical exam today shows a blood  pressure of 146/78 and her pulse is 87.  Her temperature is 99.9 degrees.  She is 92% on room air.  GENERAL APPEARANCE:  She is well developed, well nourished, and in no acute  distress.  She does not appear to be depressed.  SKIN:  Warm and dry.  HEENT:  Unremarkable with normal eyelids.  NECK:  Supple with normal upstrokes bilaterally.  There are soft bilateral  bruits greatest on the left.  CHEST:  Diminished breath sounds throughout.  Mild expiratory wheeze.  There  is increased AP diameter.  CARDIOVASCULAR:  Regular rate and rhythm.  There is normal S1 and S2.  There  is a 2/6 systolic murmur at the apex.  ABDOMEN:  Nontender and nondistended.  Positive bowel sounds.  No  hepatosplenomegaly.  No mass appreciated.  There is no abdominal bruit.  She  has 2+ femoral pulse on the left and 1+ on the right.  She does have a right  femoral bruit.  EXTREMITIES:  No edema, but there are varicosities noted.  I cannot palpate  cords.  She has 1+ dorsalis pedis pulses bilaterally.  NEUROLOGIC:  Grossly intact.   LABORATORY DATA:  White blood cell count 11.6, hemoglobin 15, hematocrit  43.8, and the platelet count is diminished at 93,000.  Her sodium is 145  with a potassium of 4.8.  The BUN and creatinine are 8 and 0.7,  respectively.  Her initial enzymes are negative.  Her electrocardiogram  shows a normal sinus rhythm.  The axis is normal.  There is poor R-wave  progression, but there are no ST changes noted.   DIAGNOSES:  1. Atypical right upper extremity pain and numbness.  2. History of diet-controlled diabetes mellitus.  3.  Hypertension.  4. Hyperlipidemia.  5. Chronic obstructive pulmonary disease.  6. Tobacco abuse.  7. History of right rotator cuff tear.   PLAN:  Mrs. Difrancesco is for evaluation of right upper extremity numbness,  aching, and burning for two hours this morning.  Her initial  electrocardiogram shows no significant ST changes and her initial enzymes  are negative.  I agree with continuing to rule out myocardial infarction  with serial enzymes.  I also would continue with her aspirin and Lipitor at  present.  Her symptoms seem somewhat atypical for cardiac pain.  If her  enzymes are negative, then we can plan to risk stratify with an outpatient  Cardiolite.  I am also concerned about the possibility of TIAs given the  description of her symptoms.  We will schedule her to have an echocardiogram  and also carotid Dopplers.  She may ultimately require neurology consult.  We will make further recommendations once we have the above information.  Of  note, her platelet count is diminished and this will need to be further  evaluated by primary care.                                               Olga Millers, M.D.    BC/MEDQ  D:  01/22/2003  T:  01/23/2003  Job:  [id number]

## 2010-08-31 ENCOUNTER — Encounter: Payer: Medicare Other | Admitting: Cardiology

## 2010-08-31 NOTE — Progress Notes (Signed)
HPI: Pleasant female I saw in March of 2012 for evaluation of murmur. Carotid Dopplers in September of 2011 showed no stenosis in the right and the previous left carotid endarterectomy site was patent. Echocardiogram in January of 2012 showed normal LV function, moderate aortic stenosis with a mean gradient of 27 mmHg, mild aortic insufficiency, mild to moderate tricuspid regurgitation. Patient had a Myoview in May 2012. Ejection fraction was 76%. There was a small reversible apical defect felt to be either mild ischemia or shifting breast attenuation. Abdominal ultrasound in May of 2012 showed mild dilatation measuring 2.6 x 2.7 cm. There was severe in-stent restenosis of the right iliac stent. ABIs and clinical correlation recommended. Since I last saw her, she continues to have dyspnea on exertion but there is no orthopnea, PND or pedal edema. She has not had exertional chest pain. There is no syncope. She has mild discomfort in her legs bilaterally with walking up a hill.   Current Outpatient Prescriptions  Medication Sig Dispense Refill  . albuterol (PROVENTIL HFA;VENTOLIN HFA) 108 (90 BASE) MCG/ACT inhaler Inhale 2 puffs into the lungs every 6 (six) hours as needed.        Marland Kitchen aspirin 325 MG tablet Take 325 mg by mouth daily.        . Calcium Carbonate (CALTRATE 600 PO) Take by mouth. 1 po daily       . cyanocobalamin 1000 MCG tablet Take 100 mcg by mouth daily.        Marland Kitchen HYDROcodone-acetaminophen (NORCO) 7.5-325 MG per tablet Take 1 tablet by mouth every 6 (six) hours as needed.        Marland Kitchen LORazepam (ATIVAN) 0.5 MG tablet Take 0.5 mg by mouth every 6 (six) hours as needed.        . Lutein 20 MG CAPS Take 45 mg by mouth daily. 2 po daily      . Multiple Vitamin (MULTIVITAMIN) capsule Take 1 capsule by mouth daily.        . Multiple Vitamins-Minerals (PRESERVISION AREDS PO) Take by mouth. 2 po daily       . Nebulizer MISC by Does not apply route. PRN       . olmesartan-hydrochlorothiazide (BENICAR  HCT) 40-25 MG per tablet 1/2 po dialy       . Omega-3 Fatty Acids (FISH OIL) 1000 MG CAPS Take by mouth. 1 po daily.  Mega Red Krill Oil      . pravastatin (PRAVACHOL) 40 MG tablet Take 1 tablet (40 mg total) by mouth every evening.  30 tablet  11     Past Medical History  Diagnosis Date  . HTN (hypertension)   . HLD (hyperlipidemia)   . Diabetes mellitus   . COPD (chronic obstructive pulmonary disease)   . PVD (peripheral vascular disease)   . Aortic stenosis     Past Surgical History  Procedure Date  . Appendectomy   . Abdominal hysterectomy   . Cataract extraction   . Foot surgery     right  . Carotid endarterectomy     Left  . Tonsillectomy     History   Social History  . Marital Status: Widowed    Spouse Name: N/A    Number of Children: N/A  . Years of Education: N/A   Occupational History  . Retired    Social History Main Topics  . Smoking status: Current Everyday Smoker -- 0.5 packs/day for 60 years    Types: Cigarettes  . Smokeless tobacco: Never Used  .  Alcohol Use: No  . Drug Use: No  . Sexually Active: Not on file   Other Topics Concern  . Not on file   Social History Narrative  . No narrative on file    ROS: no fevers or chills, productive cough, hemoptysis, dysphasia, odynophagia, melena, hematochezia, dysuria, hematuria, rash, seizure activity, orthopnea, PND, pedal edema, claudication. Remaining systems are negative.  Physical Exam: Well-developed well-nourished in no acute distress.  Skin is warm and dry.  HEENT is normal.  Neck is supple. No thyromegaly.  Chest is clear to auscultation with normal expansion.  Cardiovascular exam is regular rate and rhythm.  Abdominal exam nontender or distended. No masses palpated. Extremities show no edema. neuro grossly intact  ECG     This encounter was created in error - please disregard.

## 2010-10-03 ENCOUNTER — Other Ambulatory Visit: Payer: Self-pay | Admitting: Cardiology

## 2010-10-03 ENCOUNTER — Encounter: Payer: Self-pay | Admitting: Cardiology

## 2010-10-03 ENCOUNTER — Encounter (INDEPENDENT_AMBULATORY_CARE_PROVIDER_SITE_OTHER): Payer: Medicare Other | Admitting: Cardiology

## 2010-10-03 ENCOUNTER — Other Ambulatory Visit (INDEPENDENT_AMBULATORY_CARE_PROVIDER_SITE_OTHER): Payer: Medicare Other | Admitting: *Deleted

## 2010-10-03 DIAGNOSIS — E7800 Pure hypercholesterolemia, unspecified: Secondary | ICD-10-CM

## 2010-10-03 DIAGNOSIS — I739 Peripheral vascular disease, unspecified: Secondary | ICD-10-CM

## 2010-10-03 LAB — HEPATIC FUNCTION PANEL
Alkaline Phosphatase: 89 U/L (ref 39–117)
Bilirubin, Direct: 0.1 mg/dL (ref 0.0–0.3)
Total Protein: 6.2 g/dL (ref 6.0–8.3)

## 2010-10-03 LAB — LIPID PANEL: VLDL: 13.6 mg/dL (ref 0.0–40.0)

## 2010-10-06 ENCOUNTER — Telehealth: Payer: Self-pay | Admitting: Cardiology

## 2010-10-06 ENCOUNTER — Other Ambulatory Visit: Payer: Self-pay | Admitting: *Deleted

## 2010-10-06 DIAGNOSIS — Z79899 Other long term (current) drug therapy: Secondary | ICD-10-CM

## 2010-10-06 DIAGNOSIS — E785 Hyperlipidemia, unspecified: Secondary | ICD-10-CM

## 2010-10-06 MED ORDER — PRAVASTATIN SODIUM 80 MG PO TABS: 80.0000 mg | ORAL_TABLET | Freq: Every evening | ORAL | Status: DC

## 2010-10-06 NOTE — Telephone Encounter (Signed)
Pt returning your call

## 2010-10-10 NOTE — Telephone Encounter (Signed)
PT AWARE OF LOWER ART AND LAB RESULTS .Zack Seal

## 2010-11-21 ENCOUNTER — Other Ambulatory Visit: Payer: Self-pay | Admitting: Cardiology

## 2010-11-21 ENCOUNTER — Other Ambulatory Visit (INDEPENDENT_AMBULATORY_CARE_PROVIDER_SITE_OTHER): Payer: Medicare Other | Admitting: *Deleted

## 2010-11-21 DIAGNOSIS — Z79899 Other long term (current) drug therapy: Secondary | ICD-10-CM

## 2010-11-21 DIAGNOSIS — E785 Hyperlipidemia, unspecified: Secondary | ICD-10-CM

## 2010-11-21 LAB — LDL CHOLESTEROL, DIRECT: Direct LDL: 135.4 mg/dL

## 2010-11-21 LAB — LIPID PANEL
HDL: 63.3 mg/dL (ref >39.00)
Triglycerides: 71 mg/dL (ref 0.0–149.0)

## 2010-11-21 LAB — HEPATIC FUNCTION PANEL
ALT: 17 U/L (ref 0–35)
AST: 21 U/L (ref 0–37)
Bilirubin, Direct: 0.1 mg/dL (ref 0.0–0.3)
Total Protein: 6.6 g/dL (ref 6.0–8.3)

## 2010-11-23 ENCOUNTER — Other Ambulatory Visit: Payer: Self-pay | Admitting: *Deleted

## 2010-11-23 DIAGNOSIS — Z79899 Other long term (current) drug therapy: Secondary | ICD-10-CM

## 2010-11-23 DIAGNOSIS — E7800 Pure hypercholesterolemia, unspecified: Secondary | ICD-10-CM

## 2011-01-04 ENCOUNTER — Other Ambulatory Visit (INDEPENDENT_AMBULATORY_CARE_PROVIDER_SITE_OTHER): Payer: Medicare Other | Admitting: *Deleted

## 2011-01-04 ENCOUNTER — Other Ambulatory Visit: Payer: Self-pay | Admitting: Cardiology

## 2011-01-04 DIAGNOSIS — Z79899 Other long term (current) drug therapy: Secondary | ICD-10-CM

## 2011-01-04 DIAGNOSIS — E7800 Pure hypercholesterolemia, unspecified: Secondary | ICD-10-CM

## 2011-01-04 LAB — HEPATIC FUNCTION PANEL
AST: 21 U/L (ref 0–37)
Albumin: 4.1 g/dL (ref 3.5–5.2)
Alkaline Phosphatase: 77 U/L (ref 39–117)
Bilirubin, Direct: 0.1 mg/dL (ref 0.0–0.3)

## 2011-01-04 LAB — LIPID PANEL
Cholesterol: 203 mg/dL — ABNORMAL HIGH (ref 0–200)
Total CHOL/HDL Ratio: 4
VLDL: 14.8 mg/dL (ref 0.0–40.0)

## 2011-01-10 ENCOUNTER — Telehealth: Payer: Self-pay | Admitting: *Deleted

## 2011-01-10 DIAGNOSIS — Z79899 Other long term (current) drug therapy: Secondary | ICD-10-CM

## 2011-01-10 DIAGNOSIS — E7800 Pure hypercholesterolemia, unspecified: Secondary | ICD-10-CM

## 2011-01-10 NOTE — Telephone Encounter (Signed)
Message copied by Freddi Starr on Tue Jan 10, 2011  1:03 PM ------      Message from: Lewayne Bunting      Created: Wed Jan 04, 2011  5:32 PM       She will not achieve goal with pravachol; would prefer lipitor 80 daily if she can afford; if not increase pravachol to 80 mg daily; lipids and liver in six weeks      Olga Millers

## 2011-02-20 ENCOUNTER — Other Ambulatory Visit (INDEPENDENT_AMBULATORY_CARE_PROVIDER_SITE_OTHER): Payer: Medicare Other | Admitting: *Deleted

## 2011-02-20 DIAGNOSIS — E7800 Pure hypercholesterolemia, unspecified: Secondary | ICD-10-CM

## 2011-02-20 DIAGNOSIS — Z79899 Other long term (current) drug therapy: Secondary | ICD-10-CM

## 2011-02-21 ENCOUNTER — Encounter: Payer: Self-pay | Admitting: *Deleted

## 2011-02-21 LAB — HEPATIC FUNCTION PANEL
ALT: 17 U/L (ref 0–35)
AST: 28 U/L (ref 0–37)
Total Bilirubin: 0.9 mg/dL (ref 0.3–1.2)
Total Protein: 6.4 g/dL (ref 6.0–8.3)

## 2011-02-21 LAB — LIPID PANEL
Cholesterol: 151 mg/dL (ref 0–200)
HDL: 60.4 mg/dL (ref >39.00)
Triglycerides: 81 mg/dL (ref 0.0–149.0)
VLDL: 16.2 mg/dL (ref 0.0–40.0)

## 2011-02-24 ENCOUNTER — Ambulatory Visit: Payer: Medicare Other | Admitting: Cardiology

## 2011-03-21 ENCOUNTER — Encounter: Payer: Self-pay | Admitting: Cardiology

## 2011-03-21 ENCOUNTER — Ambulatory Visit (INDEPENDENT_AMBULATORY_CARE_PROVIDER_SITE_OTHER): Payer: Medicare Other | Admitting: Cardiology

## 2011-03-21 VITALS — BP 158/71 | HR 76 | Ht 63.0 in | Wt 117.0 lb

## 2011-03-21 DIAGNOSIS — Z720 Tobacco use: Secondary | ICD-10-CM

## 2011-03-21 DIAGNOSIS — I739 Peripheral vascular disease, unspecified: Secondary | ICD-10-CM

## 2011-03-21 DIAGNOSIS — F17200 Nicotine dependence, unspecified, uncomplicated: Secondary | ICD-10-CM

## 2011-03-21 DIAGNOSIS — I714 Abdominal aortic aneurysm, without rupture, unspecified: Secondary | ICD-10-CM

## 2011-03-21 DIAGNOSIS — E785 Hyperlipidemia, unspecified: Secondary | ICD-10-CM

## 2011-03-21 DIAGNOSIS — I350 Nonrheumatic aortic (valve) stenosis: Secondary | ICD-10-CM

## 2011-03-21 DIAGNOSIS — I359 Nonrheumatic aortic valve disorder, unspecified: Secondary | ICD-10-CM

## 2011-03-21 DIAGNOSIS — I10 Essential (primary) hypertension: Secondary | ICD-10-CM

## 2011-03-21 NOTE — Assessment & Plan Note (Signed)
Patient does have dyspnea on exertionbut this is most likely related to COPD. Her symptoms are unchanged. Plan repeat echocardiogram in May 2013.

## 2011-03-21 NOTE — Assessment & Plan Note (Signed)
Patient counseled on discontinuing. 

## 2011-03-21 NOTE — Assessment & Plan Note (Signed)
Blood pressure controlled.continue present medications. 

## 2011-03-21 NOTE — Assessment & Plan Note (Signed)
Continue aspirin and statin. 

## 2011-03-21 NOTE — Progress Notes (Signed)
ZOX:[id number] female I saw in March of 2012 for evaluation of murmur. Echocardiogram in 2004 showed normal LV function, mild left atrial enlargement, mild mitral regurgitation. Carotid Dopplers in September of 2011 showed no stenosis in the right and the previous left carotid endarterectomy site was patent. Echocardiogram in January of 2012 showed normal LV function, moderate aortic stenosis with a mean gradient of 27 mmHg, mild aortic insufficiency, mild to moderate tricuspid regurgitation. Patient had a Myoview in May 2012. Ejection fraction was 76%. There was a small reversible apical defect felt to be either mild ischemia or shifting breast attenuation. Abdominal ultrasound in May of 2012 showed mild dilatation measuring 2.6 x 2.7 cm. There was severe in-stent restenosis of the right iliac stent. ABIs in June of 2012 showed known inflow disease on the right but normal ABIs (low normal on the right). Since I last saw her in May of 2012, she continues to have dyspnea on exertion but there is no orthopnea, PND or pedal edema. She has not had exertional chest pain. There is no syncope.    Current Outpatient Prescriptions  Medication Sig Dispense Refill  . albuterol (PROVENTIL HFA;VENTOLIN HFA) 108 (90 BASE) MCG/ACT inhaler Inhale 2 puffs into the lungs every 6 (six) hours as needed.        Marland Kitchen aspirin 325 MG tablet Take 325 mg by mouth daily.        . Calcium Carbonate (CALTRATE 600 PO) Take by mouth. 1 po daily       . cyanocobalamin 1000 MCG tablet Take 100 mcg by mouth daily.        Marland Kitchen HYDROcodone-acetaminophen (NORCO) 7.5-325 MG per tablet Take 1 tablet by mouth every 6 (six) hours as needed.        Marland Kitchen LORazepam (ATIVAN) 0.5 MG tablet Take 0.5 mg by mouth every 6 (six) hours as needed.        . Lutein 20 MG CAPS Take 45 mg by mouth daily. 2 po daily      . Multiple Vitamin (MULTIVITAMIN) capsule Take 1 capsule by mouth daily.        . Multiple Vitamins-Minerals (PRESERVISION AREDS PO) Take by mouth. 2 po  daily       . Nebulizer MISC by Does not apply route. PRN       . olmesartan-hydrochlorothiazide (BENICAR HCT) 40-25 MG per tablet 1/2 po dialy       . Omega-3 Fatty Acids (FISH OIL) 1000 MG CAPS Take by mouth. 1 po daily.  Mega Red Krill Oil      . pravastatin (PRAVACHOL) 80 MG tablet Take 1 tablet (80 mg total) by mouth every evening.  30 tablet  11  . traZODone (DESYREL) 50 MG tablet Take 50 mg by mouth at bedtime.           Past Medical History  Diagnosis Date  . HTN (hypertension)   . HLD (hyperlipidemia)   . Diabetes mellitus   . COPD (chronic obstructive pulmonary disease)   . PVD (peripheral vascular disease)   . Aortic stenosis     Past Surgical History  Procedure Date  . Appendectomy   . Abdominal hysterectomy   . Cataract extraction   . Foot surgery     right  . Carotid endarterectomy     Left  . Tonsillectomy     History   Social History  . Marital Status: Widowed    Spouse Name: N/A    Number of Children: N/A  . Years of  Education: N/A   Occupational History  . Retired    Social History Main Topics  . Smoking status: Current Everyday Smoker -- 0.5 packs/day for 60 years    Types: Cigarettes  . Smokeless tobacco: Never Used  . Alcohol Use: No  . Drug Use: No  . Sexually Active: Not on file   Other Topics Concern  . Not on file   Social History Narrative  . No narrative on file    ROS: no fevers or chills, productive cough, hemoptysis, dysphasia, odynophagia, melena, hematochezia, dysuria, hematuria, rash, seizure activity, orthopnea, PND, pedal edema, claudication. Remaining systems are negative.  Physical Exam: Well-developed frail in no acute distress.  Skin is warm and dry.  HEENT is normal.  Neck is supple. No thyromegaly.  Chest with diminished BS throughout Cardiovascular exam is regular rate and rhythm. 3/6 systolic murmur Abdominal exam nontender or distended. No masses palpated. Extremities show no edema. neuro grossly  intact  ECG NSR, CRO prior septal MI

## 2011-03-21 NOTE — Patient Instructions (Signed)
Your physician wants you to follow-up in: ONE YEAR You will receive a reminder letter in the mail two months in advance. If you don't receive a letter, please call our office to schedule the follow-up appointment.   Your physician has requested that you have an echocardiogram. Echocardiography is a painless test that uses sound waves to create images of your heart. It provides your doctor with information about the size and shape of your heart and how well your heart's chambers and valves are working. This procedure takes approximately one hour. There are no restrictions for this procedure.DUE MAY 2013  Your physician has requested that you have an abdominal aorta duplex. During this test, an ultrasound is used to evaluate the aorta. Allow 30 minutes for this exam. Do not eat after midnight the day before and avoid carbonated beverages DUE MAY 2013

## 2011-03-21 NOTE — Assessment & Plan Note (Signed)
Continue statin. 

## 2011-03-21 NOTE — Assessment & Plan Note (Signed)
Repeat ultrasound May 2013.

## 2011-05-01 ENCOUNTER — Telehealth: Payer: Self-pay | Admitting: Cardiology

## 2011-05-01 DIAGNOSIS — E785 Hyperlipidemia, unspecified: Secondary | ICD-10-CM

## 2011-05-01 DIAGNOSIS — Z79899 Other long term (current) drug therapy: Secondary | ICD-10-CM

## 2011-05-01 NOTE — Telephone Encounter (Signed)
Pt calling re generic lipitor, thinks having side effects, leg pain and know arms, pls advise

## 2011-05-01 NOTE — Telephone Encounter (Signed)
Has been waking up during the night with legs hurting and now in the arms, mostly at night.  Takes Pravastatin 80 mg daily, increased from 40 mg daily in June.  This started about 2-3 months ago but is just getting worse. Advised to hold today's dose and would forward to Dr Jens Som and his nurse Stanton Kidney for review.

## 2011-05-01 NOTE — Telephone Encounter (Signed)
Change pravachol to 40 mg daily; lipids, liver and ck in six weeks Denise Adams

## 2011-05-02 MED ORDER — PRAVASTATIN SODIUM 40 MG PO TABS: 40.0000 mg | ORAL_TABLET | Freq: Every evening | ORAL | Status: DC

## 2011-05-02 NOTE — Telephone Encounter (Signed)
Spoke with pt, aware of dr crenshaw's recommendations. 

## 2011-07-06 ENCOUNTER — Ambulatory Visit (INDEPENDENT_AMBULATORY_CARE_PROVIDER_SITE_OTHER): Payer: Medicare Other | Admitting: *Deleted

## 2011-07-06 DIAGNOSIS — I6529 Occlusion and stenosis of unspecified carotid artery: Secondary | ICD-10-CM

## 2011-07-12 ENCOUNTER — Encounter: Payer: Self-pay | Admitting: Vascular Surgery

## 2011-07-12 ENCOUNTER — Other Ambulatory Visit: Payer: Self-pay | Admitting: *Deleted

## 2011-07-12 DIAGNOSIS — Z48812 Encounter for surgical aftercare following surgery on the circulatory system: Secondary | ICD-10-CM

## 2011-07-12 DIAGNOSIS — I6529 Occlusion and stenosis of unspecified carotid artery: Secondary | ICD-10-CM

## 2011-07-12 NOTE — Procedures (Unsigned)
CAROTID DUPLEX EXAM  INDICATION:  Left CEA follow-up.  HISTORY: Diabetes:  No. Cardiac:  No. Hypertension:  Yes. Smoking:  Yes. Previous Surgery:  Left CEA 05/27/2009. CV History: Amaurosis Fugax No, Paresthesias No, Hemiparesis No                                      RIGHT             LEFT Brachial systolic pressure:         140               144 Brachial Doppler waveforms:         WNL               WNL Vertebral direction of flow:        Antegrade         Antegrade DUPLEX VELOCITIES (cm/sec) CCA peak systolic                   56                55 ECA peak systolic                   85                88 ICA peak systolic                   93                63 ICA end diastolic                   27                19 PLAQUE MORPHOLOGY:                  Heterogeneous PLAQUE AMOUNT:                      Minimal           N/A PLAQUE LOCATION:                    ICA/CCA  IMPRESSION: 1. 1-39% right internal carotid artery plaquing. 2. Widely patent left carotid endarterectomy without evidence of re-     stenosis or hyperplasia. 3. Bilateral vertebral arteries are within normal limits.  ___________________________________________ Larina Earthly, M.D.  LT/MEDQ  D:  07/10/2011  T:  07/10/2011  Job:  [id number]

## 2012-01-03 ENCOUNTER — Other Ambulatory Visit: Payer: Self-pay | Admitting: Internal Medicine

## 2012-01-03 DIAGNOSIS — Z1231 Encounter for screening mammogram for malignant neoplasm of breast: Secondary | ICD-10-CM

## 2012-01-24 ENCOUNTER — Ambulatory Visit: Payer: Medicare Other

## 2012-03-27 ENCOUNTER — Ambulatory Visit: Payer: Medicare Other | Admitting: Cardiology

## 2012-03-27 ENCOUNTER — Ambulatory Visit (INDEPENDENT_AMBULATORY_CARE_PROVIDER_SITE_OTHER): Payer: Medicare Other | Admitting: Cardiology

## 2012-03-27 ENCOUNTER — Encounter: Payer: Self-pay | Admitting: Cardiology

## 2012-03-27 ENCOUNTER — Ambulatory Visit (HOSPITAL_COMMUNITY): Payer: Medicare Other | Attending: Cardiology

## 2012-03-27 VITALS — BP 130/70 | HR 89 | Wt 110.0 lb

## 2012-03-27 DIAGNOSIS — I359 Nonrheumatic aortic valve disorder, unspecified: Secondary | ICD-10-CM | POA: Insufficient documentation

## 2012-03-27 DIAGNOSIS — I714 Abdominal aortic aneurysm, without rupture, unspecified: Secondary | ICD-10-CM

## 2012-03-27 DIAGNOSIS — I739 Peripheral vascular disease, unspecified: Secondary | ICD-10-CM

## 2012-03-27 DIAGNOSIS — E785 Hyperlipidemia, unspecified: Secondary | ICD-10-CM | POA: Insufficient documentation

## 2012-03-27 DIAGNOSIS — F17200 Nicotine dependence, unspecified, uncomplicated: Secondary | ICD-10-CM | POA: Insufficient documentation

## 2012-03-27 DIAGNOSIS — I10 Essential (primary) hypertension: Secondary | ICD-10-CM | POA: Insufficient documentation

## 2012-03-27 DIAGNOSIS — I350 Nonrheumatic aortic (valve) stenosis: Secondary | ICD-10-CM

## 2012-03-27 DIAGNOSIS — E119 Type 2 diabetes mellitus without complications: Secondary | ICD-10-CM | POA: Insufficient documentation

## 2012-03-27 DIAGNOSIS — J4489 Other specified chronic obstructive pulmonary disease: Secondary | ICD-10-CM | POA: Insufficient documentation

## 2012-03-27 DIAGNOSIS — J449 Chronic obstructive pulmonary disease, unspecified: Secondary | ICD-10-CM | POA: Insufficient documentation

## 2012-03-27 NOTE — Patient Instructions (Addendum)
Your physician wants you to follow-up in: ONE YEAR WITH DR Shelda Pal will receive a reminder letter in the mail two months in advance. If you don't receive a letter, please call our office to schedule the follow-up appointment.   Your physician has requested that you have an echocardiogram. Echocardiography is a painless test that uses sound waves to create images of your heart. It provides your doctor with information about the size and shape of your heart and how well your heart's chambers and valves are working. This procedure takes approximately one hour. There are no restrictions for this procedure.   Your physician has requested that you have an abdominal aorta duplex. During this test, an ultrasound is used to evaluate the aorta. Allow 30 minutes for this exam. Do not eat after midnight the day before and avoid carbonated beverages   Your physician has requested that you have a lower extremity arterial duplex. During this test,  ultrasound are used to evaluate arterial blood flow in the legs. Allow one hour for this exam. There are no restrictions or special instructions.

## 2012-03-27 NOTE — Assessment & Plan Note (Signed)
Continue aspirin and statin. Repeat ABIs with Doppler.

## 2012-03-27 NOTE — Assessment & Plan Note (Signed)
Patient counseled on discontinuing. 

## 2012-03-27 NOTE — Progress Notes (Signed)
HPI: Pleasant female for fu of Aortic stenosis. Carotid Dopplers in March of 2013 showed 0-39% stenosis in the right and the previous left carotid endarterectomy site was patent. Echocardiogram in January of 2012 showed normal LV function, moderate aortic stenosis with a mean gradient of 27 mmHg, mild aortic insufficiency, mild to moderate tricuspid regurgitation. Patient had a Myoview in May 2012. Ejection fraction was 76%. There was a small reversible apical defect felt to be either mild ischemia or shifting breast attenuation. Abdominal ultrasound in May of 2012 showed mild dilatation measuring 2.6 x 2.7 cm. There was severe in-stent restenosis of the right iliac stent. ABIs in June of 2012 showed known inflow disease on the right but normal ABIs (low normal on the right). Since I last saw her in Dec of 2012, she continues to have dyspnea on exertion but there is no orthopnea, PND or pedal edema. She has not had exertional chest pain. There is no syncope.    Current Outpatient Prescriptions  Medication Sig Dispense Refill  . albuterol (PROVENTIL HFA;VENTOLIN HFA) 108 (90 BASE) MCG/ACT inhaler Inhale 2 puffs into the lungs every 6 (six) hours as needed.        Marland Kitchen aspirin 325 MG tablet Take 325 mg by mouth daily.        Marland Kitchen atorvastatin (LIPITOR) 20 MG tablet Take 20 mg by mouth daily.      . Cholecalciferol (VITAMIN D) 2000 UNITS CAPS Take 1 capsule by mouth daily.      . cyanocobalamin 1000 MCG tablet Take 100 mcg by mouth daily.        Marland Kitchen HYDROcodone-acetaminophen (NORCO) 7.5-325 MG per tablet Take 1 tablet by mouth every 6 (six) hours as needed.        Marland Kitchen levothyroxine (SYNTHROID, LEVOTHROID) 50 MCG tablet Take 50 mcg by mouth daily.      Marland Kitchen LORazepam (ATIVAN) 0.5 MG tablet Take 0.5 mg by mouth every 6 (six) hours as needed.        . Lutein 20 MG CAPS Take 1 capsule by mouth daily.       . Multiple Vitamin (MULTIVITAMIN) capsule Take 1 capsule by mouth daily.        . Multiple Vitamins-Minerals  (PRESERVISION AREDS PO) Take 2 tablets by mouth daily.       . Nebulizer MISC by Does not apply route. PRN       . olmesartan-hydrochlorothiazide (BENICAR HCT) 40-25 MG per tablet 1/2 po dialy       . Omega-3 Fatty Acids (FISH OIL) 1000 MG CAPS Take by mouth. 1 po daily.  Mega Red Krill Oil      . traZODone (DESYREL) 50 MG tablet Take 50 mg by mouth at bedtime.           Past Medical History  Diagnosis Date  . HTN (hypertension)   . HLD (hyperlipidemia)   . Diabetes mellitus   . COPD (chronic obstructive pulmonary disease)   . PVD (peripheral vascular disease)   . Aortic stenosis     Past Surgical History  Procedure Date  . Appendectomy   . Abdominal hysterectomy   . Cataract extraction   . Foot surgery     right  . Carotid endarterectomy     Left  . Tonsillectomy     History   Social History  . Marital Status: Widowed    Spouse Name: N/A    Number of Children: N/A  . Years of Education: N/A   Occupational History  .  Retired    Social History Main Topics  . Smoking status: Current Every Day Smoker -- 0.5 packs/day for 60 years    Types: Cigarettes  . Smokeless tobacco: Never Used  . Alcohol Use: No  . Drug Use: No  . Sexually Active: Not on file   Other Topics Concern  . Not on file   Social History Narrative  . No narrative on file    ROS: some myalgias but no fevers or chills, productive cough, hemoptysis, dysphasia, odynophagia, melena, hematochezia, dysuria, hematuria, rash, seizure activity, orthopnea, PND, pedal edema, claudication. Remaining systems are negative.  Physical Exam: Well-developed frail in no acute distress.  Skin is warm and dry.  HEENT is normal.  Neck is supple.  Chest with diminished breath sounds throughout Cardiovascular exam is regular rate and rhythm. 2/6 systolic murmur left sternal border. Abdominal exam nontender or distended. No masses palpated. Extremities show no edema. neuro grossly intact  ECG sinus rhythm at a  rate of 89. Right atrial enlargement. Left atrial enlargement. I cannot rule out prior septal infarct.

## 2012-03-27 NOTE — Assessment & Plan Note (Signed)
Repeat echocardiogram. 

## 2012-03-27 NOTE — Assessment & Plan Note (Signed)
Continue present blood pressure medications. 

## 2012-03-27 NOTE — Progress Notes (Signed)
Echocardiogram performed.  

## 2012-03-27 NOTE — Assessment & Plan Note (Signed)
Continue statin. 

## 2012-03-27 NOTE — Assessment & Plan Note (Signed)
Repeat abdominal ultrasound. 

## 2012-04-22 ENCOUNTER — Encounter (INDEPENDENT_AMBULATORY_CARE_PROVIDER_SITE_OTHER): Payer: Medicare Other

## 2012-04-22 DIAGNOSIS — I700 Atherosclerosis of aorta: Secondary | ICD-10-CM

## 2012-04-22 DIAGNOSIS — I714 Abdominal aortic aneurysm, without rupture: Secondary | ICD-10-CM

## 2012-07-05 ENCOUNTER — Ambulatory Visit: Payer: Medicare Other | Admitting: Neurosurgery

## 2012-07-05 ENCOUNTER — Other Ambulatory Visit: Payer: Medicare Other

## 2012-07-09 ENCOUNTER — Encounter (INDEPENDENT_AMBULATORY_CARE_PROVIDER_SITE_OTHER): Payer: Medicare Other

## 2012-07-09 DIAGNOSIS — I739 Peripheral vascular disease, unspecified: Secondary | ICD-10-CM

## 2012-09-05 ENCOUNTER — Other Ambulatory Visit: Payer: Self-pay | Admitting: *Deleted

## 2012-09-05 DIAGNOSIS — Z48812 Encounter for surgical aftercare following surgery on the circulatory system: Secondary | ICD-10-CM

## 2012-09-09 ENCOUNTER — Encounter: Payer: Self-pay | Admitting: Vascular Surgery

## 2012-09-10 ENCOUNTER — Ambulatory Visit (INDEPENDENT_AMBULATORY_CARE_PROVIDER_SITE_OTHER): Payer: Medicare Other | Admitting: Vascular Surgery

## 2012-09-10 ENCOUNTER — Encounter: Payer: Self-pay | Admitting: Vascular Surgery

## 2012-09-10 ENCOUNTER — Other Ambulatory Visit (INDEPENDENT_AMBULATORY_CARE_PROVIDER_SITE_OTHER): Payer: Medicare Other | Admitting: *Deleted

## 2012-09-10 DIAGNOSIS — I6529 Occlusion and stenosis of unspecified carotid artery: Secondary | ICD-10-CM | POA: Insufficient documentation

## 2012-09-10 DIAGNOSIS — Z48812 Encounter for surgical aftercare following surgery on the circulatory system: Secondary | ICD-10-CM

## 2012-09-10 NOTE — Addendum Note (Signed)
Addended by: Adria Dill L on: 09/10/2012 12:17 PM   Modules accepted: Orders

## 2012-09-10 NOTE — Progress Notes (Signed)
The patient presents today for followup of her left carotid endarterectomy in February 2011. She had a high-grade asymptomatic stenosis. She has had no neurologic deficits. Specifically she has had no amaurosis fugax, transient ischemic attack or stroke. She does remained stable from a cardiac standpoint. Unfortunately he continues to smoke cigarettes and does have shortness of breath with exertion.  Past Medical History  Diagnosis Date  . HTN (hypertension)   . HLD (hyperlipidemia)   . Diabetes mellitus   . COPD (chronic obstructive pulmonary disease)   . PVD (peripheral vascular disease)   . Aortic stenosis   . Myocardial infarction     History  Substance Use Topics  . Smoking status: Current Every Day Smoker -- 0.75 packs/day for 60 years    Types: Cigarettes  . Smokeless tobacco: Never Used  . Alcohol Use: No    Family History  Problem Relation Age of Onset  . Coronary artery disease Father   . Coronary artery disease Brother 80  . Coronary artery disease Brother 67    No Known Allergies  Current outpatient prescriptions:albuterol (PROVENTIL HFA;VENTOLIN HFA) 108 (90 BASE) MCG/ACT inhaler, Inhale 2 puffs into the lungs every 6 (six) hours as needed.  , Disp: , Rfl: ;  aspirin 325 MG tablet, Take 325 mg by mouth daily.  , Disp: , Rfl: ;  atorvastatin (LIPITOR) 20 MG tablet, Take 20 mg by mouth daily., Disp: , Rfl: ;  Cholecalciferol (VITAMIN D) 2000 UNITS CAPS, Take 1 capsule by mouth daily., Disp: , Rfl:  cyanocobalamin 1000 MCG tablet, Take 100 mcg by mouth daily.  , Disp: , Rfl: ;  HYDROcodone-acetaminophen (NORCO) 7.5-325 MG per tablet, Take 1 tablet by mouth every 6 (six) hours as needed.  , Disp: , Rfl: ;  levothyroxine (SYNTHROID, LEVOTHROID) 50 MCG tablet, Take 50 mcg by mouth daily., Disp: , Rfl: ;  LORazepam (ATIVAN) 0.5 MG tablet, Take 0.5 mg by mouth every 6 (six) hours as needed.  , Disp: , Rfl:  Lutein 20 MG CAPS, Take 1 capsule by mouth daily. , Disp: , Rfl: ;   Multiple Vitamin (MULTIVITAMIN) capsule, Take 1 capsule by mouth daily.  , Disp: , Rfl: ;  Multiple Vitamins-Minerals (PRESERVISION AREDS PO), Take 2 tablets by mouth daily. , Disp: , Rfl: ;  Nebulizer MISC, by Does not apply route. PRN , Disp: , Rfl: ;  olmesartan-hydrochlorothiazide (BENICAR HCT) 40-25 MG per tablet, 1/2 po dialy , Disp: , Rfl:  Omega-3 Fatty Acids (FISH OIL) 1000 MG CAPS, Take by mouth. 1 po daily.  Mega Red Krill Oil, Disp: , Rfl: ;  traZODone (DESYREL) 50 MG tablet, Take 50 mg by mouth at bedtime.  , Disp: , Rfl:   BP 133/55  Pulse 85  Ht 5\' 3"  (1.6 m)  Wt 101 lb 14.4 oz (46.222 kg)  BMI 18.06 kg/m2  SpO2 94%  Body mass index is 18.06 kg/(m^2).       Physical exam: Well-developed well-nourished white female no acute distress Grossly intact neurologically with equal strength bilaterally Carotid arteries without bruits bilaterally Chest clear with equal breath sounds bilaterally Heart regular rate and rhythm without murmur  Route a duplex today he was evaluated and discussed with the patient. This revealed widely patent endarterectomy on the left with no evidence of stenosis. Less than 40% thickening of the wall on the right carotid artery  Impression and plan: Stable followup with left carotid endarterectomy in 2011. I will notify should she develop any new deficits. Otherwise  will see [place] again in one year with repeat carotid duplex

## 2013-05-19 ENCOUNTER — Other Ambulatory Visit: Payer: Self-pay | Admitting: Physician Assistant

## 2013-05-20 ENCOUNTER — Other Ambulatory Visit: Payer: Self-pay | Admitting: Vascular Surgery

## 2013-05-20 DIAGNOSIS — Z48812 Encounter for surgical aftercare following surgery on the circulatory system: Secondary | ICD-10-CM

## 2013-05-20 DIAGNOSIS — I6529 Occlusion and stenosis of unspecified carotid artery: Secondary | ICD-10-CM

## 2013-09-09 ENCOUNTER — Ambulatory Visit (HOSPITAL_COMMUNITY)
Admission: RE | Admit: 2013-09-09 | Discharge: 2013-09-09 | Disposition: A | Discharge status: Home / Self Care | Payer: Medicare Other | Source: Ambulatory Visit | Attending: Vascular Surgery | Admitting: Vascular Surgery

## 2013-09-09 ENCOUNTER — Other Ambulatory Visit: Payer: Self-pay | Admitting: *Deleted

## 2013-09-09 DIAGNOSIS — I6529 Occlusion and stenosis of unspecified carotid artery: Secondary | ICD-10-CM

## 2013-09-09 DIAGNOSIS — Z48812 Encounter for surgical aftercare following surgery on the circulatory system: Secondary | ICD-10-CM

## 2013-09-10 ENCOUNTER — Encounter: Payer: Self-pay | Admitting: Vascular Surgery

## 2014-09-14 ENCOUNTER — Encounter: Payer: Self-pay | Admitting: Vascular Surgery

## 2014-09-14 ENCOUNTER — Other Ambulatory Visit: Payer: Self-pay | Admitting: Vascular Surgery

## 2014-09-14 DIAGNOSIS — I6523 Occlusion and stenosis of bilateral carotid arteries: Secondary | ICD-10-CM

## 2014-09-14 DIAGNOSIS — Z48812 Encounter for surgical aftercare following surgery on the circulatory system: Secondary | ICD-10-CM

## 2014-09-15 ENCOUNTER — Ambulatory Visit (HOSPITAL_COMMUNITY)
Admission: RE | Admit: 2014-09-15 | Discharge: 2014-09-15 | Disposition: A | Discharge status: Home / Self Care | Payer: Medicare Other | Source: Ambulatory Visit | Attending: Vascular Surgery | Admitting: Vascular Surgery

## 2014-09-15 ENCOUNTER — Encounter: Payer: Self-pay | Admitting: Vascular Surgery

## 2014-09-15 ENCOUNTER — Ambulatory Visit (INDEPENDENT_AMBULATORY_CARE_PROVIDER_SITE_OTHER): Payer: Medicare Other | Admitting: Vascular Surgery

## 2014-09-15 VITALS — BP 131/68 | HR 78 | Temp 97.5°F | Resp 18 | Ht 63.0 in | Wt 100.4 lb

## 2014-09-15 DIAGNOSIS — I6523 Occlusion and stenosis of bilateral carotid arteries: Secondary | ICD-10-CM

## 2014-09-15 DIAGNOSIS — Z48812 Encounter for surgical aftercare following surgery on the circulatory system: Secondary | ICD-10-CM | POA: Insufficient documentation

## 2014-09-15 DIAGNOSIS — I6521 Occlusion and stenosis of right carotid artery: Secondary | ICD-10-CM | POA: Insufficient documentation

## 2014-09-15 NOTE — Progress Notes (Signed)
HISTORY AND PHYSICAL     CC:  Follow up carotid duplex scan  Referring Provider:  Chilton Greathouse, MD  HPI: This is a 81 y.o. female who has known carotid stenosis is here for f/u carotid duplex scan.  Denies amaurosis fugax, paresthesias, difficulty with speech or hemiparesis.  She is s/p left carotid endarterectomy in 2011 by Dr. Arbie Cookey for asymptomatic left carotid artery stenosis.    She states that she does have restless leg syndrome that bothers her.  She states that her legs get tired and cramp after she walks a long distance.    She does continue to smoke.  She is on a statin for hyperlipidemia.  She is on an ARB for HTN.  She does take a daily aspirin.  She does have known aortic stenosis.  Past Medical History  Diagnosis Date  . HTN (hypertension)   . HLD (hyperlipidemia)   . Diabetes mellitus   . COPD (chronic obstructive pulmonary disease)   . PVD (peripheral vascular disease)   . Aortic stenosis   . Myocardial infarction     Past Surgical History  Procedure Laterality Date  . Appendectomy    . Abdominal hysterectomy    . Cataract extraction    . Foot surgery      right  . Carotid endarterectomy      Left  . Tonsillectomy      No Known Allergies  Current Outpatient Prescriptions  Medication Sig Dispense Refill  . albuterol (PROVENTIL HFA;VENTOLIN HFA) 108 (90 BASE) MCG/ACT inhaler Inhale 2 puffs into the lungs every 6 (six) hours as needed.      Marland Kitchen aspirin 325 MG tablet Take 325 mg by mouth daily.      . Cholecalciferol (VITAMIN D) 2000 UNITS CAPS Take 1 capsule by mouth daily.    Marland Kitchen HYDROcodone-acetaminophen (NORCO) 7.5-325 MG per tablet Take 1 tablet by mouth every 6 (six) hours as needed.      Marland Kitchen LORazepam (ATIVAN) 0.5 MG tablet Take 0.5 mg by mouth every 6 (six) hours as needed.      . Lutein 20 MG CAPS Take 1 capsule by mouth daily.     . Multiple Vitamin (MULTIVITAMIN) capsule Take 1 capsule by mouth daily.      . Multiple Vitamins-Minerals  (PRESERVISION AREDS PO) Take 2 tablets by mouth daily.     . Nebulizer MISC by Does not apply route. PRN     . Omega-3 Fatty Acids (FISH OIL) 1000 MG CAPS Take by mouth. 1 po daily.  Mega Red Krill Oil    . traZODone (DESYREL) 50 MG tablet Take 50 mg by mouth at bedtime.      Marland Kitchen atorvastatin (LIPITOR) 20 MG tablet Take 20 mg by mouth daily.    . cyanocobalamin 1000 MCG tablet Take 100 mcg by mouth daily.      Marland Kitchen levothyroxine (SYNTHROID, LEVOTHROID) 50 MCG tablet Take 50 mcg by mouth daily.    Marland Kitchen olmesartan-hydrochlorothiazide (BENICAR HCT) 40-25 MG per tablet 1/2 po dialy      No current facility-administered medications for this visit.    Pt's meds include: Statin:  Yes.   Beta Blocker:  No. Aspirin:  Yes.   Other antiplatelets/anticoagulants:  No.    Family History  Problem Relation Age of Onset  . Coronary artery disease Father   . Coronary artery disease Brother 92  . Coronary artery disease Brother 15    History   Social History  . Marital Status: Widowed  Spouse Name: N/A  . Number of Children: N/A  . Years of Education: N/A   Occupational History  . Retired    Social History Main Topics  . Smoking status: Current Every Day Smoker -- 0.75 packs/day for 60 years    Types: Cigarettes  . Smokeless tobacco: Never Used  . Alcohol Use: No  . Drug Use: No  . Sexual Activity: Not on file   Other Topics Concern  . Not on file   Social History Narrative     ROS: [x]  Positive   [ ]  Negative   [ ]  All sytems reviewed and are negative  Cardiovascular: []  chest pain/pressure []  palpitations []  SOB lying flat [x]  DOE []  pain in legs while walking []  pain in feet when lying flat []  hx of DVT []  hx of phlebitis []  swelling in legs []  varicose veins  Pulmonary: []  productive cough []  asthma [x]  wheezing-hx COPD  Neurologic: []  weakness in []  arms []  legs []  numbness in []  arms []  legs [] difficulty speaking or slurred speech []  temporary loss of vision in  one eye []  dizziness  Hematologic: []  bleeding problems []  problems with blood clotting easily  GI []  vomiting blood []  blood in stool  GU: []  burning with urination []  blood in urine  Psychiatric: []  hx of major depression  Integumentary: []  rashes []  ulcers  Constitutional: []  fever []  chills   PHYSICAL EXAMINATION:  Filed Vitals:   09/15/14 1548  BP: 131/68  Pulse: 78  Temp:   Resp:    Body mass index is 17.79 kg/(m^2).  General:  WDWN in NAD Gait: Normal HENT: WNL; normocephalic Pulmonary: normal non-labored breathing , without Rales, rhonchi,  wheezing Cardiac: regular, with systolic Murmur  with bilateral carotid bruits that most likely are radiating from her systolic heart murmur Abdomen: soft, NT, no masses Skin: without rashes,  without ulcers  Vascular exam: 2+ bilateral radial pulses; 1-2+ left DP; unable to palpate right pedal pulses Extremities: without ischemic changes, without Gangrene , without cellulitis; without open wounds;  Musculoskeletal: without muscle wasting or atrophy  Neurologic: A&O X 3; Appropriate Affect ; SENSATION: normal; MOTOR FUNCTION:  moving all extremities equally. Speech is fluent/normal   Non-Invasive Vascular Imaging: Carotid Duplex Scan:  09/15/2014  1.  1-49% right ICA stenosis 2.  Patent left ICA carotid artery endarterectomy site with no evidence for restenosis.   ASSESSMENT: 81 y.o. female here for f/u carotid duplex scan.   PLAN: -pt doing well and continues to be asymptomatic. -she does have bilateral carotid bruits, which most likely are radiating from her systolic heart murmur (known aortic stenosis) -will have her f/u in one year with carotid duplex -encouraged her to quit smoking   Doreatha MassedSamantha Jack Mineau, PA-C [facility] [phone]  Clinic MD:   Pt seen and examined in conjunction with Dr. Arbie CookeyEarly   I have examined the patient, reviewed and agree with above.  Gretta BeganEarly, [place],  MD 09/15/2014 5:03 PM

## 2014-09-17 NOTE — Addendum Note (Signed)
Addended by: Adria Dill L on: 09/17/2014 09:01 AM   Modules accepted: Orders

## 2015-09-16 ENCOUNTER — Encounter: Payer: Self-pay | Admitting: Family

## 2015-09-21 ENCOUNTER — Other Ambulatory Visit: Payer: Self-pay | Admitting: Vascular Surgery

## 2015-09-21 ENCOUNTER — Ambulatory Visit (HOSPITAL_COMMUNITY)
Admission: RE | Admit: 2015-09-21 | Discharge: 2015-09-21 | Disposition: A | Discharge status: Home / Self Care | Payer: Medicare Other | Source: Ambulatory Visit | Attending: Family | Admitting: Family

## 2015-09-21 ENCOUNTER — Encounter: Payer: Self-pay | Admitting: Family

## 2015-09-21 ENCOUNTER — Ambulatory Visit (INDEPENDENT_AMBULATORY_CARE_PROVIDER_SITE_OTHER): Payer: Medicare Other | Admitting: Family

## 2015-09-21 VITALS — BP 124/59 | HR 66 | Temp 97.8°F | Resp 24 | Ht 64.0 in | Wt 95.4 lb

## 2015-09-21 DIAGNOSIS — Z48812 Encounter for surgical aftercare following surgery on the circulatory system: Secondary | ICD-10-CM

## 2015-09-21 DIAGNOSIS — I6522 Occlusion and stenosis of left carotid artery: Secondary | ICD-10-CM | POA: Diagnosis not present

## 2015-09-21 DIAGNOSIS — I6523 Occlusion and stenosis of bilateral carotid arteries: Secondary | ICD-10-CM | POA: Insufficient documentation

## 2015-09-21 DIAGNOSIS — F17200 Nicotine dependence, unspecified, uncomplicated: Secondary | ICD-10-CM

## 2015-09-21 DIAGNOSIS — Z9889 Other specified postprocedural states: Secondary | ICD-10-CM | POA: Diagnosis not present

## 2015-09-21 DIAGNOSIS — E119 Type 2 diabetes mellitus without complications: Secondary | ICD-10-CM | POA: Diagnosis not present

## 2015-09-21 DIAGNOSIS — Z720 Tobacco use: Secondary | ICD-10-CM

## 2015-09-21 DIAGNOSIS — E785 Hyperlipidemia, unspecified: Secondary | ICD-10-CM | POA: Insufficient documentation

## 2015-09-21 DIAGNOSIS — I10 Essential (primary) hypertension: Secondary | ICD-10-CM | POA: Diagnosis not present

## 2015-09-21 DIAGNOSIS — I6529 Occlusion and stenosis of unspecified carotid artery: Secondary | ICD-10-CM | POA: Insufficient documentation

## 2015-09-21 NOTE — Patient Instructions (Signed)
Stroke Prevention Some medical conditions and behaviors are associated with an increased chance of having a stroke. You may prevent a stroke by making healthy choices and managing medical conditions. HOW CAN I REDUCE MY RISK OF HAVING A STROKE?   Stay physically active. Get at least 30 minutes of activity on most or all days.  Do not smoke. It may also be helpful to avoid exposure to secondhand smoke.  Limit alcohol use. Moderate alcohol use is considered to be:  No more than 2 drinks per day for men.  No more than 1 drink per day for nonpregnant women.  Eat healthy foods. This involves:  Eating 5 or more servings of fruits and vegetables a day.  Making dietary changes that address high blood pressure (hypertension), high cholesterol, diabetes, or obesity.  Manage your cholesterol levels.  Making food choices that are high in fiber and low in saturated fat, trans fat, and cholesterol may control cholesterol levels.  Take any prescribed medicines to control cholesterol as directed by your health care provider.  Manage your diabetes.  Controlling your carbohydrate and sugar intake is recommended to manage diabetes.  Take any prescribed medicines to control diabetes as directed by your health care provider.  Control your hypertension.  Making food choices that are low in salt (sodium), saturated fat, trans fat, and cholesterol is recommended to manage hypertension.  Ask your health care provider if you need treatment to lower your blood pressure. Take any prescribed medicines to control hypertension as directed by your health care provider.  If you are 18-39 years of age, have your blood pressure checked every 3-5 years. If you are 40 years of age or older, have your blood pressure checked every year.  Maintain a healthy weight.  Reducing calorie intake and making food choices that are low in sodium, saturated fat, trans fat, and cholesterol are recommended to manage  weight.  Stop drug abuse.  Avoid taking birth control pills.  Talk to your health care provider about the risks of taking birth control pills if you are over 35 years old, smoke, get migraines, or have ever had a blood clot.  Get evaluated for sleep disorders (sleep apnea).  Talk to your health care provider about getting a sleep evaluation if you snore a lot or have excessive sleepiness.  Take medicines only as directed by your health care provider.  For some people, aspirin or blood thinners (anticoagulants) are helpful in reducing the risk of forming abnormal blood clots that can lead to stroke. If you have the irregular heart rhythm of atrial fibrillation, you should be on a blood thinner unless there is a good reason you cannot take them.  Understand all your medicine instructions.  Make sure that other conditions (such as anemia or atherosclerosis) are addressed. SEEK IMMEDIATE MEDICAL CARE IF:   You have sudden weakness or numbness of the face, arm, or leg, especially on one side of the body.  Your face or eyelid droops to one side.  You have sudden confusion.  You have trouble speaking (aphasia) or understanding.  You have sudden trouble seeing in one or both eyes.  You have sudden trouble walking.  You have dizziness.  You have a loss of balance or coordination.  You have a sudden, severe headache with no known cause.  You have new chest pain or an irregular heartbeat. Any of these symptoms may represent a serious problem that is an emergency. Do not wait to see if the symptoms will   go away. Get medical help at once. Call your local emergency services (911 in [place] Do not drive yourself to the hospital.   This information is not intended to replace advice given to you by your health care provider. Make sure you discuss any questions you have with your health care provider.   Document Released: 05/04/2004 Document Revised: 04/17/2014 Document Reviewed:  09/27/2012 Elsevier Interactive Patient Education 2016 [employer]  

## 2015-09-21 NOTE — Progress Notes (Signed)
Chief Complaint: Follow up Extracranial Carotid Artery Stenosis   History of Present Illness  Denise Adams is a 82 y.o. female patient of Denise Adams who has known carotid artery stenosis and returns for f/u carotid duplex scan. Denies amaurosis fugax, paresthesias, difficulty with speech or hemiparesis. She is s/p left carotid endarterectomy in 2011 by Denise Adams for asymptomatic left carotid artery stenosis.   She states that she does have restless leg syndrome that bothers her. She states that her legs get tired and cramp after she walks a long distance.   She does continue to smoke. She is on a statin for hyperlipidemia. She is on an ARB for HTN. She does take a daily aspirin. She does have known aortic stenosis.  The patient denies any history of TIA or stroke symptoms, specifically the patient denies a history of amaurosis fugax or monocular blindness, denies a history unilateral  of facial drooping, denies a history of hemiplegia, and denies a history of receptive or expressive aphasia.    The patient reports New Medical or Surgical History: had pneumonia in May 2017, was treated by her PCP. She states she is not losing weight, but her appetite is not good.  Pt Diabetic: no Pt smoker: smoker  (1 ppd, started at age 63 yrs)  Pt meds include: Statin : no, she decided to make dietary changes, states her PCP is aware that she is not taking a statin ASA: yes, 81 mg daily Other anticoagulants/antiplatelets: no   Past Medical History  Diagnosis Date  . HTN (hypertension)   . HLD (hyperlipidemia)   . Diabetes mellitus   . COPD (chronic obstructive pulmonary disease) (HCC)   . PVD (peripheral vascular disease) (HCC)   . Aortic stenosis   . Myocardial infarction [facility])     Social History Social History  Substance Use Topics  . Smoking status: Current Every Day Smoker -- 0.75 packs/day for 60 years    Types: Cigarettes  . Smokeless tobacco: Never Used  . Alcohol  Use: No    Family History Family History  Problem Relation Age of Onset  . Coronary artery disease Father   . Coronary artery disease Brother 8  . Coronary artery disease Brother 2    Surgical History Past Surgical History  Procedure Laterality Date  . Appendectomy    . Abdominal hysterectomy    . Cataract extraction    . Foot surgery      right  . Carotid endarterectomy      Left  . Tonsillectomy      No Known Allergies  Current Outpatient Prescriptions  Medication Sig Dispense Refill  . albuterol (PROVENTIL HFA;VENTOLIN HFA) 108 (90 BASE) MCG/ACT inhaler Inhale 2 puffs into the lungs every 6 (six) hours as needed.      Marland Kitchen aspirin 325 MG tablet Take 325 mg by mouth daily.      . Cholecalciferol (VITAMIN D) 2000 UNITS CAPS Take 1 capsule by mouth daily.    Marland Kitchen HYDROcodone-acetaminophen (NORCO) 7.5-325 MG per tablet Take 1 tablet by mouth every 6 (six) hours as needed.      . Multiple Vitamin (MULTIVITAMIN) capsule Take 1 capsule by mouth daily.      . Multiple Vitamins-Minerals (PRESERVISION AREDS PO) Take 2 tablets by mouth daily.     . Nebulizer MISC by Does not apply route. PRN     . Omega-3 Fatty Acids (FISH OIL) 1000 MG CAPS Take by mouth. 1 po daily.  Mega Red Krill Oil    .  traZODone (DESYREL) 50 MG tablet Take 50 mg by mouth at bedtime.      Marland Kitchen. atorvastatin (LIPITOR) 20 MG tablet Take 20 mg by mouth daily. Reported on 09/21/2015    . cyanocobalamin 1000 MCG tablet Take 100 mcg by mouth daily. Reported on 09/21/2015    . levothyroxine (SYNTHROID, LEVOTHROID) 50 MCG tablet Take 50 mcg by mouth daily. Reported on 09/21/2015    . LORazepam (ATIVAN) 0.5 MG tablet Take 0.5 mg by mouth every 6 (six) hours as needed. Reported on 09/21/2015    . Lutein 20 MG CAPS Take 1 capsule by mouth daily. Reported on 09/21/2015    . olmesartan-hydrochlorothiazide (BENICAR HCT) 40-25 MG per tablet Reported on 09/21/2015     No current facility-administered medications for this visit.     Review of Systems : See HPI for pertinent positives and negatives.  Physical Examination  Filed Vitals:   09/21/15 1213 09/21/15 1214  BP: 124/55 124/59  Pulse: 66   Temp: 97.8 F (36.6 C)   TempSrc: Oral   Resp: 24   Height: 5\' 4"  (1.626 m)   Weight: 95 lb 6.4 oz (43.273 kg)    Body mass index is 16.37 kg/(m^2).  General: WDWN thin female in NAD GAIT: normal Eyes: PERRLA Pulmonary:  Respirations are non-labored, CTAB but limited air movement in all fields  Cardiac: regular rhythm, no detected murmur.  VASCULAR EXAM Carotid Bruits Right Left   Positive Positive    Aorta is mildly palpable, pt is very thin. Radial pulses are 2+ palpable and equal.                                                                                                                            LE Pulses Right Left       POPLITEAL  not palpable   not palpable       POSTERIOR TIBIAL  not palpable   not palpable        DORSALIS PEDIS      ANTERIOR TIBIAL  palpable   palpable     Gastrointestinal: soft, nontender, BS WNL, no r/g, no palpable masses.  Musculoskeletal: no muscle atrophy/wasting. M/S 5/5 throughout, extremities without ischemic changes.  Neurologic: A&O X 3; Appropriate Affect, Speech is normal CN 2-12 intact, pain and light touch intact in extremities, Motor exam as listed above.   Non-Invasive Vascular Imaging CAROTID DUPLEX 09/21/2015   Right ICA: <40% stenosis. Left ICA: CEA site with no restenosis. Vertebral arteries are antegrade, bilateral subclavian arteries are multiphasic (normal). No significant change compared to exam on 09/15/14.   Assessment: Denise Adams is a 82 y.o. female who is s/p left carotid endarterectomy in 2011. She has no history of stroke or TIA.  Today's carotid duplex suggests <40% right ICA stenosis. Left ICA: CEA site with no restenosis. Vertebral arteries are antegrade, bilateral subclavian arteries are multiphasic  (normal). No significant change compared to exam on 09/15/14.  Her primary atherosclerotic  risk factor is her continued ppd smoking, started at age 89; see Plan.   Plan: Follow-up in 1 year with Carotid Duplex scan.  The patient was counseled re smoking cessation and given several free resources re smoking cessation.   I discussed in depth with the patient the nature of atherosclerosis, and emphasized the importance of maximal medical management including strict control of blood pressure, blood glucose, and lipid levels, obtaining regular exercise, and cessation of smoking.  The patient is aware that without maximal medical management the underlying atherosclerotic disease process will progress, limiting the benefit of any interventions. The patient was given information about stroke prevention and what symptoms should prompt the patient to seek immediate medical care. Thank you for allowing [place] to participate in this patient's care.  Charisse March, RN, MSN, FNP-C [facility] Office: [phone]  Clinic Physician: Early  09/21/2015 12:26 PM

## 2015-11-04 NOTE — Addendum Note (Signed)
Addended by: Melodye Ped C on: 11/04/2015 11:08 AM   Modules accepted: Orders

## 2016-05-11 ENCOUNTER — Emergency Department (HOSPITAL_COMMUNITY): Payer: Medicare Other

## 2016-05-11 ENCOUNTER — Encounter (HOSPITAL_COMMUNITY): Payer: Self-pay | Admitting: Emergency Medicine

## 2016-05-11 ENCOUNTER — Observation Stay (HOSPITAL_COMMUNITY)
Admission: EM | Admit: 2016-05-11 | Discharge: 2016-05-13 | Disposition: A | Discharge status: Home / Self Care | Payer: Medicare Other | Attending: Internal Medicine | Admitting: Internal Medicine

## 2016-05-11 DIAGNOSIS — Z8249 Family history of ischemic heart disease and other diseases of the circulatory system: Secondary | ICD-10-CM | POA: Diagnosis not present

## 2016-05-11 DIAGNOSIS — J441 Chronic obstructive pulmonary disease with (acute) exacerbation: Secondary | ICD-10-CM | POA: Diagnosis not present

## 2016-05-11 DIAGNOSIS — M25551 Pain in right hip: Secondary | ICD-10-CM | POA: Diagnosis not present

## 2016-05-11 DIAGNOSIS — I110 Hypertensive heart disease with heart failure: Secondary | ICD-10-CM | POA: Insufficient documentation

## 2016-05-11 DIAGNOSIS — R7989 Other specified abnormal findings of blood chemistry: Secondary | ICD-10-CM | POA: Diagnosis present

## 2016-05-11 DIAGNOSIS — I5032 Chronic diastolic (congestive) heart failure: Secondary | ICD-10-CM | POA: Insufficient documentation

## 2016-05-11 DIAGNOSIS — R0682 Tachypnea, not elsewhere classified: Secondary | ICD-10-CM | POA: Insufficient documentation

## 2016-05-11 DIAGNOSIS — M1611 Unilateral primary osteoarthritis, right hip: Secondary | ICD-10-CM | POA: Insufficient documentation

## 2016-05-11 DIAGNOSIS — E1151 Type 2 diabetes mellitus with diabetic peripheral angiopathy without gangrene: Secondary | ICD-10-CM | POA: Insufficient documentation

## 2016-05-11 DIAGNOSIS — I10 Essential (primary) hypertension: Secondary | ICD-10-CM | POA: Diagnosis not present

## 2016-05-11 DIAGNOSIS — I350 Nonrheumatic aortic (valve) stenosis: Secondary | ICD-10-CM | POA: Insufficient documentation

## 2016-05-11 DIAGNOSIS — Z9849 Cataract extraction status, unspecified eye: Secondary | ICD-10-CM | POA: Diagnosis not present

## 2016-05-11 DIAGNOSIS — B349 Viral infection, unspecified: Secondary | ICD-10-CM | POA: Diagnosis not present

## 2016-05-11 DIAGNOSIS — D72829 Elevated white blood cell count, unspecified: Secondary | ICD-10-CM | POA: Insufficient documentation

## 2016-05-11 DIAGNOSIS — R05 Cough: Secondary | ICD-10-CM

## 2016-05-11 DIAGNOSIS — R52 Pain, unspecified: Secondary | ICD-10-CM

## 2016-05-11 DIAGNOSIS — Z90710 Acquired absence of both cervix and uterus: Secondary | ICD-10-CM | POA: Insufficient documentation

## 2016-05-11 DIAGNOSIS — E039 Hypothyroidism, unspecified: Secondary | ICD-10-CM | POA: Diagnosis present

## 2016-05-11 DIAGNOSIS — I252 Old myocardial infarction: Secondary | ICD-10-CM | POA: Insufficient documentation

## 2016-05-11 DIAGNOSIS — E871 Hypo-osmolality and hyponatremia: Secondary | ICD-10-CM | POA: Diagnosis not present

## 2016-05-11 DIAGNOSIS — R945 Abnormal results of liver function studies: Secondary | ICD-10-CM

## 2016-05-11 DIAGNOSIS — G8929 Other chronic pain: Secondary | ICD-10-CM | POA: Insufficient documentation

## 2016-05-11 DIAGNOSIS — R531 Weakness: Secondary | ICD-10-CM

## 2016-05-11 DIAGNOSIS — I6529 Occlusion and stenosis of unspecified carotid artery: Secondary | ICD-10-CM | POA: Diagnosis not present

## 2016-05-11 DIAGNOSIS — E785 Hyperlipidemia, unspecified: Secondary | ICD-10-CM | POA: Insufficient documentation

## 2016-05-11 DIAGNOSIS — I714 Abdominal aortic aneurysm, without rupture: Secondary | ICD-10-CM | POA: Insufficient documentation

## 2016-05-11 DIAGNOSIS — F17210 Nicotine dependence, cigarettes, uncomplicated: Secondary | ICD-10-CM | POA: Diagnosis not present

## 2016-05-11 DIAGNOSIS — R0902 Hypoxemia: Secondary | ICD-10-CM | POA: Diagnosis present

## 2016-05-11 DIAGNOSIS — Z7982 Long term (current) use of aspirin: Secondary | ICD-10-CM | POA: Insufficient documentation

## 2016-05-11 DIAGNOSIS — I2510 Atherosclerotic heart disease of native coronary artery without angina pectoris: Secondary | ICD-10-CM | POA: Insufficient documentation

## 2016-05-11 DIAGNOSIS — Z79899 Other long term (current) drug therapy: Secondary | ICD-10-CM | POA: Insufficient documentation

## 2016-05-11 DIAGNOSIS — R059 Cough, unspecified: Secondary | ICD-10-CM

## 2016-05-11 LAB — COMPREHENSIVE METABOLIC PANEL WITH GFR
ALT: 17 U/L (ref 14–54)
AST: 46 U/L — ABNORMAL HIGH (ref 15–41)
Albumin: 3.3 g/dL — ABNORMAL LOW (ref 3.5–5.0)
Alkaline Phosphatase: 87 U/L (ref 38–126)
Anion gap: 13 (ref 5–15)
BUN: 12 mg/dL (ref 6–20)
CO2: 25 mmol/L (ref 22–32)
Calcium: 9.2 mg/dL (ref 8.9–10.3)
Chloride: 93 mmol/L — ABNORMAL LOW (ref 101–111)
Creatinine, Ser: 0.59 mg/dL (ref 0.44–1.00)
GFR calc Af Amer: >60 mL/min (ref >60)
GFR calc non Af Amer: >60 mL/min (ref >60)
Glucose, Bld: 121 mg/dL — ABNORMAL HIGH (ref 65–99)
Potassium: 4.5 mmol/L (ref 3.5–5.1)
Sodium: 131 mmol/L — ABNORMAL LOW (ref 135–145)
Total Bilirubin: 1.7 mg/dL — ABNORMAL HIGH (ref 0.3–1.2)
Total Protein: 6.6 g/dL (ref 6.5–8.1)

## 2016-05-11 LAB — INFLUENZA PANEL BY PCR (TYPE A & B)
Influenza A By PCR: NEGATIVE
Influenza B By PCR: NEGATIVE

## 2016-05-11 LAB — URINALYSIS, ROUTINE W REFLEX MICROSCOPIC
Bacteria, UA: NONE SEEN
Bilirubin Urine: NEGATIVE
Glucose, UA: NEGATIVE mg/dL
Hgb urine dipstick: NEGATIVE
Ketones, ur: NEGATIVE mg/dL
Leukocytes, UA: NEGATIVE
Nitrite: NEGATIVE
Protein, ur: 30 mg/dL — AB
Specific Gravity, Urine: 1.014 (ref 1.005–1.030)
pH: 8 (ref 5.0–8.0)

## 2016-05-11 LAB — I-STAT TROPONIN, ED: Troponin i, poc: 0 ng/mL (ref 0.00–0.08)

## 2016-05-11 LAB — CBC WITH DIFFERENTIAL/PLATELET
Basophils Absolute: 0 K/uL (ref 0.0–0.1)
Basophils Relative: 0 %
Eosinophils Absolute: 0 K/uL (ref 0.0–0.7)
Eosinophils Relative: 0 %
HCT: 35.8 % — ABNORMAL LOW (ref 36.0–46.0)
Hemoglobin: 12.2 g/dL (ref 12.0–15.0)
Lymphocytes Relative: 5 %
Lymphs Abs: 1.1 K/uL (ref 0.7–4.0)
MCH: 32.8 pg (ref 26.0–34.0)
MCHC: 34.1 g/dL (ref 30.0–36.0)
MCV: 96.2 fL (ref 78.0–100.0)
Monocytes Absolute: 1.1 K/uL — ABNORMAL HIGH (ref 0.1–1.0)
Monocytes Relative: 5 %
Neutro Abs: 21.3 K/uL — ABNORMAL HIGH (ref 1.7–7.7)
Neutrophils Relative %: 90 %
Platelets: 366 K/uL (ref 150–400)
RBC: 3.72 MIL/uL — ABNORMAL LOW (ref 3.87–5.11)
RDW: 13.2 % (ref 11.5–15.5)
WBC: 23.5 K/uL — ABNORMAL HIGH (ref 4.0–10.5)

## 2016-05-11 LAB — I-STAT CG4 LACTIC ACID, ED: Lactic Acid, Venous: 1.4 mmol/L (ref 0.5–1.9)

## 2016-05-11 LAB — CBG MONITORING, ED: Glucose-Capillary: 117 mg/dL — ABNORMAL HIGH (ref 65–99)

## 2016-05-11 LAB — BRAIN NATRIURETIC PEPTIDE: B Natriuretic Peptide: 439.1 pg/mL — ABNORMAL HIGH (ref 0.0–100.0)

## 2016-05-11 MED ORDER — MORPHINE SULFATE (PF) 4 MG/ML IV SOLN
2.0000 mg | Freq: Once | INTRAVENOUS | Status: AC
  Administered 2016-05-11: 2 mg via INTRAVENOUS
  Filled 2016-05-11: qty 1

## 2016-05-11 MED ORDER — ASPIRIN 325 MG PO TABS
325.0000 mg | ORAL_TABLET | Freq: Every day | ORAL | Status: DC
  Administered 2016-05-12 – 2016-05-13 (×2): 325 mg via ORAL
  Filled 2016-05-11 (×2): qty 1

## 2016-05-11 MED ORDER — HYDROCODONE-ACETAMINOPHEN 5-325 MG PO TABS
2.0000 | ORAL_TABLET | Freq: Once | ORAL | Status: AC
  Administered 2016-05-11: 2 via ORAL
  Filled 2016-05-11: qty 2

## 2016-05-11 MED ORDER — OLMESARTAN MEDOXOMIL-HCTZ 40-25 MG PO TABS: 1.0000 | ORAL_TABLET | Freq: Every day | ORAL | Status: DC

## 2016-05-11 MED ORDER — LORAZEPAM 0.5 MG PO TABS: 0.5000 mg | ORAL_TABLET | Freq: Four times a day (QID) | ORAL | Status: DC | PRN

## 2016-05-11 MED ORDER — ENOXAPARIN SODIUM 30 MG/0.3ML ~~LOC~~ SOLN
30.0000 mg | Freq: Every day | SUBCUTANEOUS | Status: DC
  Administered 2016-05-12 – 2016-05-13 (×2): 30 mg via SUBCUTANEOUS
  Filled 2016-05-11 (×2): qty 0.3

## 2016-05-11 MED ORDER — ACETAMINOPHEN 650 MG RE SUPP: 650.0000 mg | Freq: Four times a day (QID) | RECTAL | Status: DC | PRN

## 2016-05-11 MED ORDER — ALBUTEROL SULFATE (2.5 MG/3ML) 0.083% IN NEBU
2.5000 mg | INHALATION_SOLUTION | Freq: Four times a day (QID) | RESPIRATORY_TRACT | Status: DC
  Filled 2016-05-11: qty 3

## 2016-05-11 MED ORDER — SODIUM CHLORIDE 0.9 % IV BOLUS (SEPSIS)
1000.0000 mL | Freq: Once | INTRAVENOUS | Status: AC
  Administered 2016-05-11: 1000 mL via INTRAVENOUS

## 2016-05-11 MED ORDER — LEVOFLOXACIN IN D5W 500 MG/100ML IV SOLN
500.0000 mg | Freq: Once | INTRAVENOUS | Status: AC
  Administered 2016-05-11: 500 mg via INTRAVENOUS
  Filled 2016-05-11: qty 100

## 2016-05-11 MED ORDER — HYDROCHLOROTHIAZIDE 25 MG PO TABS
25.0000 mg | ORAL_TABLET | Freq: Every day | ORAL | Status: DC
  Administered 2016-05-12 – 2016-05-13 (×2): 25 mg via ORAL
  Filled 2016-05-11 (×2): qty 1

## 2016-05-11 MED ORDER — LEVOFLOXACIN 750 MG PO TABS: 750.0000 mg | ORAL_TABLET | ORAL | Status: DC

## 2016-05-11 MED ORDER — SODIUM CHLORIDE 0.9 % IV SOLN
INTRAVENOUS | Status: DC
  Administered 2016-05-11 – 2016-05-12 (×2): via INTRAVENOUS

## 2016-05-11 MED ORDER — ONDANSETRON HCL 4 MG PO TABS: 4.0000 mg | ORAL_TABLET | Freq: Four times a day (QID) | ORAL | Status: DC | PRN

## 2016-05-11 MED ORDER — LEVOTHYROXINE SODIUM 50 MCG PO TABS
50.0000 ug | ORAL_TABLET | Freq: Every day | ORAL | Status: DC
Start: 2016-05-12 — End: 2016-05-13
  Administered 2016-05-12 – 2016-05-13 (×2): 50 ug via ORAL
  Filled 2016-05-11 (×2): qty 1

## 2016-05-11 MED ORDER — NAPROXEN 250 MG PO TABS
250.0000 mg | ORAL_TABLET | Freq: Once | ORAL | Status: AC
  Administered 2016-05-11: 250 mg via ORAL
  Filled 2016-05-11: qty 1

## 2016-05-11 MED ORDER — ALBUTEROL SULFATE (2.5 MG/3ML) 0.083% IN NEBU: 2.5000 mg | INHALATION_SOLUTION | RESPIRATORY_TRACT | Status: DC | PRN

## 2016-05-11 MED ORDER — ATORVASTATIN CALCIUM 20 MG PO TABS
20.0000 mg | ORAL_TABLET | Freq: Every day | ORAL | Status: DC
  Administered 2016-05-13: 20 mg via ORAL
  Filled 2016-05-11 (×2): qty 1

## 2016-05-11 MED ORDER — HYDROCODONE-ACETAMINOPHEN 7.5-325 MG PO TABS
1.0000 | ORAL_TABLET | Freq: Four times a day (QID) | ORAL | Status: DC | PRN
  Administered 2016-05-11 – 2016-05-13 (×5): 1 via ORAL
  Filled 2016-05-11 (×5): qty 1

## 2016-05-11 MED ORDER — IRBESARTAN 300 MG PO TABS
300.0000 mg | ORAL_TABLET | Freq: Every day | ORAL | Status: DC
  Administered 2016-05-12 – 2016-05-13 (×2): 300 mg via ORAL
  Filled 2016-05-11 (×2): qty 1

## 2016-05-11 MED ORDER — SODIUM CHLORIDE 0.9 % IV SOLN
INTRAVENOUS | Status: DC
  Administered 2016-05-11: 20:00:00 via INTRAVENOUS

## 2016-05-11 MED ORDER — ACETAMINOPHEN 325 MG PO TABS: 650.0000 mg | ORAL_TABLET | Freq: Four times a day (QID) | ORAL | Status: DC | PRN

## 2016-05-11 MED ORDER — ONDANSETRON HCL 4 MG/2ML IJ SOLN: 4.0000 mg | Freq: Four times a day (QID) | INTRAMUSCULAR | Status: DC | PRN

## 2016-05-11 MED ORDER — IPRATROPIUM BROMIDE 0.02 % IN SOLN
0.5000 mg | Freq: Once | RESPIRATORY_TRACT | Status: AC
  Administered 2016-05-11: 0.5 mg via RESPIRATORY_TRACT
  Filled 2016-05-11: qty 2.5

## 2016-05-11 MED ORDER — ALBUTEROL SULFATE (2.5 MG/3ML) 0.083% IN NEBU
5.0000 mg | INHALATION_SOLUTION | Freq: Once | RESPIRATORY_TRACT | Status: AC
  Administered 2016-05-11: 5 mg via RESPIRATORY_TRACT
  Filled 2016-05-11: qty 6

## 2016-05-11 MED ORDER — TRAZODONE HCL 50 MG PO TABS
50.0000 mg | ORAL_TABLET | Freq: Every day | ORAL | Status: DC
  Administered 2016-05-11 – 2016-05-12 (×2): 50 mg via ORAL
  Filled 2016-05-11 (×2): qty 1

## 2016-05-11 NOTE — Care Management Note (Signed)
Case Management Note  Patient Details  Name: Rennis ChrisKatherine M Simones MRN: [id number] Date of Birth: 07-19-1931  Subjective/Objective:                [facility] PCP office with N/V and elevated WBC of 26 and lingering cough.  Patient with a history of hypertension,hyperlipidemia, MI, diabetes, COPD and aortic stenosis Patient lives at home with daughter in which she is the caregiver for with CP.  [facility]  [employer] [employer] [facility] and [facility] CM will follow up for care transitional planning.   Action/Plan:   Expected Discharge Date:                  Expected Discharge Plan:  Home w Home Health Services  In-House Referral:     Discharge planning Services  CM Consult  Post Acute Care Choice:  Home Health Choice offered to:  Patient  DME Arranged:    DME Agency:     HH Arranged:  RN, PT HH Agency:     Status of Service:  In process, will continue to follow  If discussed at Long Length of Stay Meetings, dates discussed:    Additional CommentsMichel Bickers:  Doneen Ollinger, RN 05/11/2016, 10:22 PM

## 2016-05-11 NOTE — ED Triage Notes (Signed)
Per EMS: Pt was being seen at PCP and her WBC was 26.  Pt is also c/o of weakness x 3 days.

## 2016-05-11 NOTE — H&P (Signed)
History and Physical  Patient Name: Denise Adams     RUE:[id number]    DOB: 01-05-1932    DOA: 05/11/2016 PCP: Hoyle Sauer, MD   Patient coming from: Home --> [facility] Felipa Eth --> ER  Chief Complaint: Toma Deiters  HPI: Denise Adams is a 83 y.o. female with a past medical history significant for HTN, aortic stenosis, CAD and PVD and ongoing smoking who presents with malaise and cough.  The patient was in her usual state of health until about a week ago when she developed URI symptoms of congestion and cough. This initially seemed to be getting better, and then over the last 3 days she's felt weaker and vague malaise. Then today she woke up cough and nausea much worse than before. She had no fever, no body aches, no new sputum production, no wheezing, no shortness of breath. She had no abdominal pain, no vomiting, no diarrhea. She had no leg swelling, no orthopnea, no chest pain. She just "felt bad" so she went to her PCP, who checked labs, found that she had a white count greater than 20,000, and referred her to the emergency room suspecting "bacterial infection".  ED course: -Afebrile, heart rate 78, respirations 22, blood pressure 167/79, pulse oximetry initially 89% on room air and slightly tachypneic -Na 131, K 4.5, Cr 0.6, WBC 23.5K, Hgb 12 -BNP 439 -Troponin negative -Lactic acid 1.4 -UA without pyuria, bacteria -CXR showed changes of emphysema, chronic fibrotic change, no new opacity or obvious pneumonia -AST minimally elevated, total bilirubin 1.7 -She was given nebs which made her feel better, and levaquin for suspected CXR negative pneumonia and TRH were asked to evaluate for admission    ROS: Review of Systems  Constitutional: Positive for malaise/fatigue. Negative for chills and fever.  HENT: Positive for congestion.   Respiratory: Positive for cough. Negative for hemoptysis, sputum production, shortness of breath and wheezing.   Cardiovascular:  Negative for chest pain, palpitations, orthopnea and leg swelling.  Gastrointestinal: Positive for nausea. Negative for abdominal pain, blood in stool, diarrhea, melena and vomiting.  Neurological: Positive for weakness. Negative for dizziness, speech change, focal weakness and loss of consciousness.  All other systems reviewed and are negative.         Past Medical History:  Diagnosis Date  . Aortic stenosis   . COPD (chronic obstructive pulmonary disease) (HCC)   . Diabetes mellitus   . HLD (hyperlipidemia)   . HTN (hypertension)   . Myocardial infarction   . PVD (peripheral vascular disease) (HCC)     Past Surgical History:  Procedure Laterality Date  . ABDOMINAL HYSTERECTOMY    . APPENDECTOMY    . CAROTID ENDARTERECTOMY     Left  . CATARACT EXTRACTION    . FOOT SURGERY     right  . TONSILLECTOMY      Social History: Patient lives with her family.  The patient walks unassisted.  She smokes.    No Known Allergies  Family history: family history includes Coronary artery disease in her father; Coronary artery disease (age of onset: 55) in her brother; Coronary artery disease (age of onset: 64) in her brother.  Prior to Admission medications   Medication Sig Start Date End Date Taking? Authorizing Provider  albuterol (PROVENTIL HFA;VENTOLIN HFA) 108 (90 BASE) MCG/ACT inhaler Inhale 2 puffs into the lungs every 6 (six) hours as needed.     Yes Historical Provider, MD  aspirin 325 MG tablet Take 325 mg by mouth daily.  Yes Historical Provider, MD  atorvastatin (LIPITOR) 20 MG tablet Take 20 mg by mouth daily. Reported on 09/21/2015   Yes Historical Provider, MD  Cholecalciferol (VITAMIN D) 2000 UNITS CAPS Take 1 capsule by mouth daily.   Yes Historical Provider, MD  cyanocobalamin 1000 MCG tablet Take 100 mcg by mouth daily. Reported on 09/21/2015   Yes Historical Provider, MD  HYDROcodone-acetaminophen (NORCO) 7.5-325 MG per tablet Take 1 tablet by mouth every 6 (six)  hours as needed for moderate pain.    Yes Historical Provider, MD  levothyroxine (SYNTHROID, LEVOTHROID) 50 MCG tablet Take 50 mcg by mouth daily. Reported on 09/21/2015   Yes Historical Provider, MD  LORazepam (ATIVAN) 0.5 MG tablet Take 0.5 mg by mouth every 6 (six) hours as needed. Reported on 09/21/2015   Yes Historical Provider, MD  Lutein 20 MG CAPS Take 1 capsule by mouth daily. Reported on 09/21/2015   Yes Historical Provider, MD  Multiple Vitamin (MULTIVITAMIN) capsule Take 1 capsule by mouth daily.     Yes Historical Provider, MD  Multiple Vitamins-Minerals (PRESERVISION AREDS PO) Take 2 tablets by mouth daily.    Yes Historical Provider, MD  olmesartan-hydrochlorothiazide (BENICAR HCT) 40-25 MG per tablet Reported on 09/21/2015   Yes Historical Provider, MD  Omega-3 Fatty Acids (FISH OIL) 1000 MG CAPS Take by mouth. 1 po daily.  Mega Red Krill Oil   Yes Historical Provider, MD  traZODone (DESYREL) 50 MG tablet Take 50 mg by mouth at bedtime.     Yes Historical Provider, MD       Physical Exam: BP 123/72   Pulse 80   Temp 98.9 F (37.2 C) (Oral)   Resp 26   Ht 5\' 3"  (1.6 m)   Wt 42.6 kg (94 lb)   SpO2 95%   BMI 16.65 kg/m  General appearance: Thin adult female, alert and in no acute distress.   Eyes: Anicteric, conjunctiva pink, lids and lashes normal. PERRL.    ENT: No nasal deformity, discharge, epistaxis.  Hearing slightly decreased. OP dry without lesions.   Neck: No neck masses.  Trachea midline.  No thyromegaly/tenderness. Lymph: No cervical or supraclavicular lymphadenopathy. Skin: Warm and dry.  No jaundice.  No suspicious rashes or lesions. Cardiac: RRR, nl S1-S2, SEM noted.  Capillary refill is brisk.  JVP normal.  No LE edema.  Radial and DP pulses 2+ and symmetric. Respiratory: Slightly increased respiratory rate.  No wheezing.  No focal rales. Abdomen: Abdomen soft.  No TTP at all, no guarding, no rebound. No ascites, distension, hepatosplenomegaly.   MSK: No  deformities or effusions.  No cyanosis or clubbing.  Markedly decreased muscle mass diffusely. Loss of subQ fat. Neuro: Cranial nerves grossly normal.  Sensation intact to light touch. Speech is fluent.  Muscle strength 5/5 and symmetric.    Psych: Sensorium intact and responding to questions, attention normal.  Behavior appropriate.  Affect normal.  Judgment and insight appear normal.     Labs on Admission:  I have personally reviewed following labs and imaging studies: CBC:  Recent Labs Lab 05/11/16 1647  WBC 23.5*  NEUTROABS 21.3*  HGB 12.2  HCT 35.8*  MCV 96.2  PLT 366   Basic Metabolic Panel:  Recent Labs Lab 05/11/16 1647  NA 131*  K 4.5  CL 93*  CO2 25  GLUCOSE 121*  BUN 12  CREATININE 0.59  CALCIUM 9.2   GFR: Estimated Creatinine Clearance: 36.5 mL/min (by C-G formula based on SCr of 0.59 mg/dL).  Liver  Function Tests:  Recent Labs Lab 05/11/16 1647  AST 46*  ALT 17  ALKPHOS 87  BILITOT 1.7*  PROT 6.6  ALBUMIN 3.3*   No results for input(s): LIPASE, AMYLASE in the last 168 hours. No results for input(s): AMMONIA in the last 168 hours. Coagulation Profile: No results for input(s): INR, PROTIME in the last 168 hours. Cardiac Enzymes: No results for input(s): CKTOTAL, CKMB, CKMBINDEX, TROPONINI in the last 168 hours. BNP (last 3 results) No results for input(s): PROBNP in the last 8760 hours. HbA1C: No results for input(s): HGBA1C in the last 72 hours. CBG:  Recent Labs Lab 05/11/16 1647  GLUCAP 117*   Lipid Profile: No results for input(s): CHOL, HDL, LDLCALC, TRIG, CHOLHDL, LDLDIRECT in the last 72 hours. Thyroid Function Tests: No results for input(s): TSH, T4TOTAL, FREET4, T3FREE, THYROIDAB in the last 72 hours. Anemia Panel: No results for input(s): VITAMINB12, FOLATE, FERRITIN, TIBC, IRON, RETICCTPCT in the last 72 hours. Sepsis Labs: Lactic acid 1.4 Invalid input(s): PROCALCITONIN, LACTICIDVEN No results found for this or any  previous visit (from the past 240 hour(s)).       Radiological Exams on Admission: Personally reviewed: Dg Chest 2 View  Result Date: 05/11/2016 CLINICAL DATA:  Nausea and cough EXAM: CHEST  2 VIEW COMPARISON:  05/25/2009 FINDINGS: Marked hyperinflation and mild cardiomegaly, unchanged. Diffuse emphysematous and fibrotic appearing changes are present throughout both lungs. No airspace consolidation. No effusion. Normal pulmonary vasculature. Hilar and mediastinal contours are unremarkable and unchanged. IMPRESSION: Severe chronic changes, without evidence of a superimposed acute cardiopulmonary process Electronically Signed   By: Ellery Plunk M.D.   On: 05/11/2016 19:17    EKG: Independently reviewed. Rate 72, QTc 486, prec TW changes, old.  Echo 2013: EF 55-60% Grade I DD PAP 31 Moderate AS No folow up since.        Assessment/Plan Principal Problem:   Viral syndrome Active Problems:   Essential hypertension   Hypothyroidism, acquired   Hyponatremia   Elevated LFTs  1. Weakness, suspect viral syndrome vs early pneumonia/CXR neg pneumonia:  PSI score would be 72-82, risk class III, will recommend observation overnight.  -Repeat chest x-ray tomorrow after fluids -Check Procalcitonin -scheduled -Oral Levaquin for now -No wheezing, will defer steroids -Smoking cessation recommended, specific modalities discussed. Nursing teaching ordered   2. Elevated LFTs:  Unclear significance, and abdominal exam benign.  Doubt cholangtis, cholecystitis. -Repeat LFT tomorrow -Check fractionated bilirubin  3. Hypertension:  BP hypertensive at admission. -Continue ARB-HCT  4. Chronic diastolic CHF and aortic stenosis:  No evidence of fluid overload on exam.  Not on diuretic.  Not being followed for AS regularly that I can see.  5. Hyponatremia:  Mild.  Unclear chronicity.  Presumably hypovolemic, based on exam.  On HCT. -Check urine lytes  6. Hypothyroidism:  -Continue  levothyroxine -Check TSH  7. Chronic pain:  -Continue Norco PRN -Continue trazodone, BZD and citalopram -Naproxen once for hip pain     DVT prophylaxis: Lovenox  Code Status: FULL  Family Communication: Daughter and son at bedside  Disposition Plan: Anticipate IV fluids, repeat CXR, empiric abx for now, and consider de-escalating treatment if RVP+. Consults called: None Admission status: OBS At the point of initial evaluation, it is my clinical opinion that admission for OBSERVATION is reasonable and necessary because the patient's presenting complaints in the context of their chronic conditions represent sufficient risk of deterioration or significant morbidity to constitute reasonable grounds for close observation in the hospital setting, but  that the patient may be medically stable for discharge from the hospital within 24 to 48 hours.    Medical decision making: Patient seen at 10:06 PM on 05/11/2016.  The patient was discussed with Dr. Anitra Lauth.  What exists of the patient's chart was reviewed in depth and summarized above.  Clinical condition: stable.        Alberteen Sam [facility] Pager [phone]

## 2016-05-11 NOTE — ED Notes (Signed)
Waiting for Levaquin from pharmacy

## 2016-05-11 NOTE — ED Provider Notes (Addendum)
[facility]-EMERGENCY DEPT Provider Note   CSN: [id number] Arrival date & time: 05/11/16  1604     History   Chief Complaint Chief Complaint  Patient presents with  . Weakness  . Abnormal Lab    HPI Denise Adams is a 83 y.o. female.  Patient is an 83 year old elderly female with a history of hypertension, hyperlipidemia, MI, diabetes, COPD and aortic stenosis presenting today from her PCPs office for generalized weakness and nausea. Patient was found to have a white blood cell count of 26,000 in the office. She states last week she had an upper respiratory infection which seemed to improve but she had a lingering cough. Today around 2 AM she woke up with persistent coughing and nausea. She denies any vomiting, abdominal pain, chest pain, shortness of breath, urinary complaints. She is complaining of pain in her right hip running down her leg but that is been an ongoing issue and she has been in bed more today which she thinks is just caused it to hurt more. She did not wear oxygen at home but does use inhalers. They have not helped her symptoms today. She did receive a flu shot this year and has also had the pneumonia vaccine   The history is provided by the patient.  Weakness  This is a new problem. The current episode started 12 to 24 hours ago. The problem has been gradually worsening. There was no focality noted. There has been no fever. Pertinent negatives include no shortness of breath, no chest pain and no vomiting. Associated symptoms comments: Nausea and cough that is intermittently productive. Associated medical issues do not include trauma. Associated medical issues comments: copd, AS, DM, HTN, HLD, tobacco abuse.  Abnormal Lab    Past Medical History:  Diagnosis Date  . Aortic stenosis   . COPD (chronic obstructive pulmonary disease) (HCC)   . Diabetes mellitus   . HLD (hyperlipidemia)   . HTN (hypertension)   . Myocardial infarction   . PVD (peripheral vascular  disease) [facility](HCC)     Patient Active Problem List   Diagnosis Date Noted  . Carotid artery stenosis 09/21/2015  . Occlusion and stenosis of carotid artery without mention of cerebral infarction 09/10/2012  . Abdominal aortic aneurysm (HCC) 08/23/2010  . Nonspecific abnormal unspecified cardiovascular function study 08/23/2010  . Aortic stenosis 06/30/2010  . Shortness of breath dyspnea 06/30/2010  . Abdominal bruit 06/30/2010  . Tobacco abuse 06/30/2010  . HTN (hypertension)   . HLD (hyperlipidemia)   . Diabetes mellitus   . PVD (peripheral vascular disease) (HCC)     Past Surgical History:  Procedure Laterality Date  . ABDOMINAL HYSTERECTOMY    . APPENDECTOMY    . CAROTID ENDARTERECTOMY     Left  . CATARACT EXTRACTION    . FOOT SURGERY     right  . TONSILLECTOMY      OB History    No data available       Home Medications    Prior to Admission medications   Medication Sig Start Date End Date Taking? Authorizing Provider  albuterol (PROVENTIL HFA;VENTOLIN HFA) 108 (90 BASE) MCG/ACT inhaler Inhale 2 puffs into the lungs every 6 (six) hours as needed.      Historical Provider, MD  aspirin 325 MG tablet Take 325 mg by mouth daily.      Historical Provider, MD  atorvastatin (LIPITOR) 20 MG tablet Take 20 mg by mouth daily. Reported on 09/21/2015    Historical Provider, MD  Cholecalciferol (  VITAMIN D) 2000 UNITS CAPS Take 1 capsule by mouth daily.    Historical Provider, MD  cyanocobalamin 1000 MCG tablet Take 100 mcg by mouth daily. Reported on 09/21/2015    Historical Provider, MD  HYDROcodone-acetaminophen (NORCO) 7.5-325 MG per tablet Take 1 tablet by mouth every 6 (six) hours as needed.      Historical Provider, MD  levothyroxine (SYNTHROID, LEVOTHROID) 50 MCG tablet Take 50 mcg by mouth daily. Reported on 09/21/2015    Historical Provider, MD  LORazepam (ATIVAN) 0.5 MG tablet Take 0.5 mg by mouth every 6 (six) hours as needed. Reported on 09/21/2015    Historical Provider, MD   Lutein 20 MG CAPS Take 1 capsule by mouth daily. Reported on 09/21/2015    Historical Provider, MD  Multiple Vitamin (MULTIVITAMIN) capsule Take 1 capsule by mouth daily.      Historical Provider, MD  Multiple Vitamins-Minerals (PRESERVISION AREDS PO) Take 2 tablets by mouth daily.     Historical Provider, MD  Nebulizer MISC by Does not apply route. PRN     Historical Provider, MD  olmesartan-hydrochlorothiazide (BENICAR HCT) 40-25 MG per tablet Reported on 09/21/2015    Historical Provider, MD  Omega-3 Fatty Acids (FISH OIL) 1000 MG CAPS Take by mouth. 1 po daily.  Mega Red Krill Oil    Historical Provider, MD  traZODone (DESYREL) 50 MG tablet Take 50 mg by mouth at bedtime.      Historical Provider, MD    Family History Family History  Problem Relation Age of Onset  . Coronary artery disease Father   . Coronary artery disease Brother 39  . Coronary artery disease Brother 3    Social History Social History  Substance Use Topics  . Smoking status: Current Every Day Smoker    Packs/day: 0.75    Years: 60.00    Types: Cigarettes  . Smokeless tobacco: Never Used  . Alcohol use No     Allergies   Patient has no known allergies.   Review of Systems Review of Systems  Respiratory: Negative for shortness of breath.   Cardiovascular: Negative for chest pain.  Gastrointestinal: Negative for vomiting.  Neurological: Positive for weakness.  All other systems reviewed and are negative.    Physical Exam Updated Vital Signs BP 167/79 (BP Location: Right Arm)   Pulse 78   Temp 98.9 F (37.2 C) (Oral)   Resp 22   Ht 5\' 3"  (1.6 m)   Wt 94 lb (42.6 kg)   SpO2 95%   BMI 16.65 kg/m   Physical Exam  Constitutional: She is oriented to person, place, and time. She appears well-developed and well-nourished. No distress.  HENT:  Head: Normocephalic and atraumatic.  Mouth/Throat: Mucous membranes are dry. No oropharyngeal exudate.  Eyes: Conjunctivae and EOM are normal. Pupils are  equal, round, and reactive to light.  Neck: Normal range of motion. Neck supple.  Cardiovascular: Normal rate, regular rhythm and intact distal pulses.   Murmur heard.  Systolic murmur is present with a grade of 3/6  Murmur heard best at the left sternal border  Pulmonary/Chest: Effort normal. Tachypnea noted. No respiratory distress. She has wheezes. She has rhonchi. She has no rales.  Wet sounding cough  Abdominal: Soft. She exhibits no distension. There is no tenderness. There is no rebound and no guarding.  Musculoskeletal: Normal range of motion. She exhibits no edema.       Right hip: She exhibits tenderness. She exhibits normal range of motion and normal strength.  Legs: Neurological: She is alert and oriented to person, place, and time. She has normal strength. No sensory deficit.  Skin: Skin is warm and dry. Capillary refill takes 2 to 3 seconds. No rash noted. No erythema.  Psychiatric: She has a normal mood and affect. Her behavior is normal.  Nursing note and vitals reviewed.    ED Treatments / Results  Labs (all labs ordered are listed, but only abnormal results are displayed) Labs Reviewed  CBC WITH DIFFERENTIAL/PLATELET - Abnormal; Notable for the following:       Result Value   WBC 23.5 (*)    RBC 3.72 (*)    HCT 35.8 (*)    Neutro Abs 21.3 (*)    Monocytes Absolute 1.1 (*)    All other components within normal limits  COMPREHENSIVE METABOLIC PANEL - Abnormal; Notable for the following:    Sodium 131 (*)    Chloride 93 (*)    Glucose, Bld 121 (*)    Albumin 3.3 (*)    AST 46 (*)    Total Bilirubin 1.7 (*)    All other components within normal limits  BRAIN NATRIURETIC PEPTIDE - Abnormal; Notable for the following:    B Natriuretic Peptide 439.1 (*)    All other components within normal limits  URINALYSIS, ROUTINE W REFLEX MICROSCOPIC - Abnormal; Notable for the following:    Protein, ur 30 (*)    Squamous Epithelial / LPF 0-5 (*)    All other  components within normal limits  CBG MONITORING, ED - Abnormal; Notable for the following:    Glucose-Capillary 117 (*)    All other components within normal limits  I-STAT TROPOININ, ED  I-STAT CG4 LACTIC ACID, ED    EKG  EKG Interpretation  Date/Time:  Thursday May 11 2016 16:29:05 EST Ventricular Rate:  72 PR Interval:    QRS Duration: 93 QT Interval:  444 QTC Calculation: 486 R Axis:   58 Text Interpretation:  Sinus rhythm Anterior infarct, old No significant change since last tracing Confirmed by [facility]  MD, Bertin Inabinet ([id number]) on 05/11/2016 5:24:09 PM       Radiology Dg Chest 2 View  Result Date: 05/11/2016 CLINICAL DATA:  Nausea and cough EXAM: CHEST  2 VIEW COMPARISON:  05/25/2009 FINDINGS: Marked hyperinflation and mild cardiomegaly, unchanged. Diffuse emphysematous and fibrotic appearing changes are present throughout both lungs. No airspace consolidation. No effusion. Normal pulmonary vasculature. Hilar and mediastinal contours are unremarkable and unchanged. IMPRESSION: Severe chronic changes, without evidence of a superimposed acute cardiopulmonary process Electronically Signed   By: Ellery Plunk M.D.   On: 05/11/2016 19:17    Procedures Procedures (including critical care time)  Medications Ordered in ED Medications  0.9 %  sodium chloride infusion (not administered)  levofloxacin (LEVAQUIN) IVPB 500 mg (not administered)  albuterol (PROVENTIL) (2.5 MG/3ML) 0.083% nebulizer solution 5 mg (5 mg Nebulization Given 05/11/16 1639)  ipratropium (ATROVENT) nebulizer solution 0.5 mg (0.5 mg Nebulization Given 05/11/16 1639)  sodium chloride 0.9 % bolus 1,000 mL (0 mLs Intravenous Stopped 05/11/16 1753)  morphine 4 MG/ML injection 2 mg (2 mg Intravenous Given 05/11/16 1639)  HYDROcodone-acetaminophen (NORCO/VICODIN) 5-325 MG per tablet 2 tablet (2 tablets Oral Given 05/11/16 1845)     Initial Impression / Assessment and Plan / ED Course  I have reviewed the triage vital  signs and the nursing notes.  Pertinent labs & imaging results that were available during my care of the patient were reviewed by me and considered in my  medical decision making (see chart for details).    Patient is an elderly female coming in today for elevated white blood cell count, generalized weakness and feeling ill. Patient has had a worsening cough with a history of COPD and recent cold as well as nausea. Concern for potential underlying cardiac issue versus pneumonia. Low suspicion for PE, dissection or AAA rupture. Patient has no reproducible abdominal pain making abdominal pathology less likely. Currently hemodynamically stable. CBC, CMP, lactate, troponin, BMP, chest x-ray pending. Patient given IV fluids, albuterol and Atrovent. She was given something for her right hip pain which is most likely sciatica in her nausea.  7:39 PM Breath sounds improved after nebs per patient still feeling poorly. Leukocytosis of 23,000. Normal urine, troponin and lactic acid. CMP without evidence of DKA. Concerned that patient may be developing infection.  She was given levaquin and ongoing IVF.  Final Clinical Impressions(s) / ED Diagnoses   Final diagnoses:  Generalized weakness  Hypoxia  Leukocytosis, unspecified type    New Prescriptions New Prescriptions   No medications on file     Gwyneth Sprout, MD 05/11/16 1941    Gwyneth Sprout, MD 05/11/16 2001

## 2016-05-12 ENCOUNTER — Observation Stay (HOSPITAL_COMMUNITY): Payer: Medicare Other

## 2016-05-12 DIAGNOSIS — I10 Essential (primary) hypertension: Secondary | ICD-10-CM | POA: Diagnosis not present

## 2016-05-12 DIAGNOSIS — E871 Hypo-osmolality and hyponatremia: Secondary | ICD-10-CM

## 2016-05-12 DIAGNOSIS — B349 Viral infection, unspecified: Secondary | ICD-10-CM | POA: Diagnosis not present

## 2016-05-12 DIAGNOSIS — R7989 Other specified abnormal findings of blood chemistry: Secondary | ICD-10-CM

## 2016-05-12 DIAGNOSIS — E039 Hypothyroidism, unspecified: Secondary | ICD-10-CM

## 2016-05-12 LAB — RESPIRATORY PANEL BY PCR
ADENOVIRUS-RVPPCR: NOT DETECTED
Bordetella pertussis: NOT DETECTED
CHLAMYDOPHILA PNEUMONIAE-RVPPCR: NOT DETECTED
CORONAVIRUS NL63-RVPPCR: NOT DETECTED
CORONAVIRUS OC43-RVPPCR: NOT DETECTED
Coronavirus 229E: NOT DETECTED
Coronavirus HKU1: NOT DETECTED
INFLUENZA A-RVPPCR: NOT DETECTED
INFLUENZA B-RVPPCR: NOT DETECTED
MYCOPLASMA PNEUMONIAE-RVPPCR: NOT DETECTED
Metapneumovirus: NOT DETECTED
PARAINFLUENZA VIRUS 1-RVPPCR: NOT DETECTED
PARAINFLUENZA VIRUS 3-RVPPCR: NOT DETECTED
PARAINFLUENZA VIRUS 4-RVPPCR: NOT DETECTED
Parainfluenza Virus 2: NOT DETECTED
RESPIRATORY SYNCYTIAL VIRUS-RVPPCR: NOT DETECTED
RHINOVIRUS / ENTEROVIRUS - RVPPCR: NOT DETECTED

## 2016-05-12 LAB — HEPATIC FUNCTION PANEL
ALK PHOS: 65 U/L (ref 38–126)
ALT: 14 U/L (ref 14–54)
AST: 23 U/L (ref 15–41)
Albumin: 2.5 g/dL — ABNORMAL LOW (ref 3.5–5.0)
BILIRUBIN INDIRECT: 1.2 mg/dL — AB (ref 0.3–0.9)
Bilirubin, Direct: 0.2 mg/dL (ref 0.1–0.5)
TOTAL PROTEIN: 5.3 g/dL — AB (ref 6.5–8.1)
Total Bilirubin: 1.4 mg/dL — ABNORMAL HIGH (ref 0.3–1.2)

## 2016-05-12 LAB — BILIRUBIN, FRACTIONATED(TOT/DIR/INDIR)
BILIRUBIN TOTAL: 1 mg/dL (ref 0.3–1.2)
Bilirubin, Direct: 0.2 mg/dL (ref 0.1–0.5)
Indirect Bilirubin: 0.8 mg/dL (ref 0.3–0.9)

## 2016-05-12 LAB — OSMOLALITY: Osmolality: 277 mOsm/kg (ref 275–295)

## 2016-05-12 LAB — CBC
HEMATOCRIT: 30.3 % — AB (ref 36.0–46.0)
HEMOGLOBIN: 10.2 g/dL — AB (ref 12.0–15.0)
MCH: 32.7 pg (ref 26.0–34.0)
MCHC: 33.7 g/dL (ref 30.0–36.0)
MCV: 97.1 fL (ref 78.0–100.0)
Platelets: 297 10*3/uL (ref 150–400)
RBC: 3.12 MIL/uL — ABNORMAL LOW (ref 3.87–5.11)
RDW: 13.6 % (ref 11.5–15.5)
WBC: 11.4 10*3/uL — ABNORMAL HIGH (ref 4.0–10.5)

## 2016-05-12 LAB — BASIC METABOLIC PANEL
ANION GAP: 9 (ref 5–15)
BUN: 13 mg/dL (ref 6–20)
CALCIUM: 8.4 mg/dL — AB (ref 8.9–10.3)
CO2: 26 mmol/L (ref 22–32)
Chloride: 97 mmol/L — ABNORMAL LOW (ref 101–111)
Creatinine, Ser: 0.73 mg/dL (ref 0.44–1.00)
GFR calc Af Amer: >60 mL/min (ref >60)
GFR calc non Af Amer: >60 mL/min (ref >60)
GLUCOSE: 79 mg/dL (ref 65–99)
Potassium: 3.7 mmol/L (ref 3.5–5.1)
Sodium: 132 mmol/L — ABNORMAL LOW (ref 135–145)

## 2016-05-12 LAB — PROCALCITONIN: Procalcitonin: <0.1 ng/mL

## 2016-05-12 LAB — OSMOLALITY, URINE: OSMOLALITY UR: 597 mosm/kg (ref 300–900)

## 2016-05-12 LAB — TSH: TSH: 1.597 u[IU]/mL (ref 0.350–4.500)

## 2016-05-12 LAB — SODIUM, URINE, RANDOM: Sodium, Ur: 140 mmol/L

## 2016-05-12 MED ORDER — MOMETASONE FURO-FORMOTEROL FUM 200-5 MCG/ACT IN AERO
2.0000 | INHALATION_SPRAY | Freq: Two times a day (BID) | RESPIRATORY_TRACT | Status: DC
  Administered 2016-05-13: 2 via RESPIRATORY_TRACT
  Filled 2016-05-12 (×2): qty 8.8

## 2016-05-12 MED ORDER — FENTANYL CITRATE (PF) 100 MCG/2ML IJ SOLN
25.0000 ug | INTRAMUSCULAR | Status: DC | PRN
  Administered 2016-05-12 – 2016-05-13 (×3): 25 ug via INTRAVENOUS
  Filled 2016-05-12 (×3): qty 2

## 2016-05-12 MED ORDER — IPRATROPIUM-ALBUTEROL 0.5-2.5 (3) MG/3ML IN SOLN
3.0000 mL | RESPIRATORY_TRACT | Status: DC
  Administered 2016-05-12: 3 mL via RESPIRATORY_TRACT
  Filled 2016-05-12: qty 3

## 2016-05-12 MED ORDER — SENNOSIDES-DOCUSATE SODIUM 8.6-50 MG PO TABS
1.0000 | ORAL_TABLET | Freq: Two times a day (BID) | ORAL | Status: DC
  Filled 2016-05-12: qty 1

## 2016-05-12 MED ORDER — NICOTINE 21 MG/24HR TD PT24
21.0000 mg | MEDICATED_PATCH | Freq: Every day | TRANSDERMAL | Status: DC
  Filled 2016-05-12 (×2): qty 1

## 2016-05-12 MED ORDER — LEVOFLOXACIN IN D5W 500 MG/100ML IV SOLN
500.0000 mg | INTRAVENOUS | Status: DC
  Administered 2016-05-12: 500 mg via INTRAVENOUS
  Filled 2016-05-12: qty 100

## 2016-05-12 MED ORDER — POLYETHYLENE GLYCOL 3350 17 G PO PACK: 17.0000 g | PACK | Freq: Every day | ORAL | Status: DC | PRN

## 2016-05-12 MED ORDER — LOPERAMIDE HCL 2 MG PO CAPS
2.0000 mg | ORAL_CAPSULE | Freq: Once | ORAL | Status: AC
  Administered 2016-05-12: 2 mg via ORAL
  Filled 2016-05-12: qty 1

## 2016-05-12 MED ORDER — IPRATROPIUM-ALBUTEROL 0.5-2.5 (3) MG/3ML IN SOLN
3.0000 mL | Freq: Four times a day (QID) | RESPIRATORY_TRACT | Status: DC
  Administered 2016-05-12 – 2016-05-13 (×2): 3 mL via RESPIRATORY_TRACT
  Filled 2016-05-12 (×3): qty 3

## 2016-05-12 MED ORDER — WHITE PETROLATUM GEL
Status: AC
  Administered 2016-05-12: 10:00:00
  Filled 2016-05-12: qty 1

## 2016-05-12 MED ORDER — LEVOFLOXACIN IN D5W 250 MG/50ML IV SOLN
250.0000 mg | INTRAVENOUS | Status: DC
  Filled 2016-05-12: qty 50

## 2016-05-12 MED ORDER — LEVOFLOXACIN IN D5W 750 MG/150ML IV SOLN: 750.0000 mg | INTRAVENOUS | Status: DC

## 2016-05-12 NOTE — Progress Notes (Signed)
Pt states she had two loose diarrhea stools and states she had two bites of her sandwich. States she does not have an appetite. Contacted attending to make aware. Isidoro DonningAlesia Jaxsyn Catalfamo RN CCM Case Mgmt phone [phone]

## 2016-05-12 NOTE — Progress Notes (Signed)
Pt arrived on the unit via bed. Alert and oriented x 4.  IV to the right forearm with normal saline infusing at 18325ml/hr.  Skin assessment completed. No skin issues.  Pt complained of Chronic right hip and leg pain.  Educated the patient on how to reach the staff. Lowered the bed.  Activated the bed alarm. Will administer pain med.  Will continue to monitor the patient and notify as needed.

## 2016-05-12 NOTE — Care Management Note (Signed)
Case Management Note  Patient Details  Name: Denise Adams MRN: [id number] Date of Birth: 1931/11/25  Subjective/Objective:    Viral Syndrome, early PNA/COPD                Action/Plan: Discharge Planning: NCM spoke to pt and states she lives at home with dtr. States she was independent prior to hospital stay. She still drives to her MD appts. No problems with getting meds. She has neb machine at home but not oxygen. Will have Unit RN check walking sats prior to dc to see home oxygen needed.   PCP Chilton GreathouseAVVA, RAVISANKAR MD  Expected Discharge Date:                 Expected Discharge Plan:  Home/Self Care  In-House Referral:  NA  Discharge planning Services  CM Consult  Post Acute Care Choice:  NA Choice offered to:  Patient  DME Arranged:  N/A DME Agency:  NA  HH Arranged:  NA HH Agency:     Status of Service:  In process, will continue to follow  If discussed at Long Length of Stay Meetings, dates discussed:    Additional Comments:  Elliot CousinShavis, Zackary Mckeone Ellen, RN 05/12/2016, 4:34 PM

## 2016-05-12 NOTE — Care Management Obs Status (Signed)
MEDICARE OBSERVATION STATUS NOTIFICATION   Patient Details  Name: Denise Adams MRN: [id number] Date of Birth: 1931-05-25   Medicare Observation Status Notification Given:  Yes    Elliot CousinShavis, Nazair Fortenberry Ellen, RN 05/12/2016, 3:18 PM

## 2016-05-12 NOTE — Progress Notes (Signed)
Pharmacy- Renal Adjustment of Antibiotics  83 y.o female presented with viral syndrome vs early pneumonia/ COPD exacerbation. She  was started on  levaquin 500mg  IV q24h  The patient's SCr = 0.73, weight -42 kg,     with a calculated CrCl ~36 ml/min For a CrCl=20-49 ml/min, pharmacy has adjusted levaquin dose to 250mg  q24h  .  Pharmacy will sign off.  Noah Delaineuth Ladarren Steiner, RPh Clinical Pharmacist Pager: [phone] 05/12/2016 6:49 PM

## 2016-05-12 NOTE — Progress Notes (Signed)
[facility]                                                                              Patient Demographics  Denise Adams, is a 83 y.o. female, DOB - 06-09-1931, ZOX:[id number]  Admit date - 05/11/2016   Admitting Physician Alberteen Sam, MD  Outpatient Primary MD for the patient is Hoyle Sauer, MD  Outpatient specialists:   LOS - 0  days    Chief Complaint  Patient presents with  . Weakness  . Abnormal Lab       Brief summary   Denise Adams is a 83 y.o. female with a past medical history significant for HTN, aortic stenosis, CAD and PVD and ongoing smoking who presents with malaise and cough. Patient reported that she got URI symptoms of congestion, cough. No fever, body aches or sputum or shortness of breath. She went to her PCP and who found his white count of >20K, recommended her to go to the ED. In ED, O2 sats were 89% on room air, slightly tachypneic, WBCs 20 3.5K, BNP 439   Assessment & Plan    Principal Problem:  viral syndrome vs early pneumonia/ COPD exacerbation - Continue current management, placed on scheduled nebs, added dulera  - flutter valve, levaquin - Leukocytosis improving, 11.4 today   Elevated LFTs:  Unclear significance, and abdominal exam benign.  - Improved  Hypertension:  BP hypertensive at admission. -Continue ARB-HCT  Chronic diastolic CHF and aortic stenosis:  No evidence of fluid overload on exam.  Not on diuretic.   Hyponatremia:  Mild.  Unclear chronicity.  Presumably hypovolemic, based on exam.  On HCTZ.   Hypothyroidism:  -Continue levothyroxine - TSH 1.5  Chronic pain:  -Continue Norco PRN -Continue trazodone, BZD and citalopram  Right hip pain- - per daughter chronic issue - Obtain right hip x-ray, placed on fentanyl IV for severe pain and continue Vicodin for moderate pain  Code Status:full  DVT Prophylaxis:  Lovenox  Family Communication: Discussed in detail with  the patient, all imaging results, lab results explained to the patient, family members in the room   Disposition Plan: Hopefully tomorrow if feeling better  Time Spent in minutes  25 minutes  Procedures:    Consultants:     Antimicrobials:      Medications  Scheduled Meds: . aspirin  325 mg Oral Daily  . atorvastatin  20 mg Oral Daily  . enoxaparin (LOVENOX) injection  30 mg Subcutaneous Daily  . irbesartan  300 mg Oral Daily   And  . hydrochlorothiazide  25 mg Oral Daily  . ipratropium-albuterol  3 mL Nebulization Q6H  . levofloxacin  750 mg Oral Q48H  . levothyroxine  50 mcg Oral QAC breakfast  . mometasone-formoterol  2 puff Inhalation BID  . nicotine  21 mg Transdermal Daily  . traZODone  50 mg Oral QHS   Continuous Infusions: PRN Meds:.acetaminophen **OR** acetaminophen, albuterol, HYDROcodone-acetaminophen, LORazepam, ondansetron **OR** ondansetron (ZOFRAN) IV   Antibiotics   Anti-infectives    Start     Dose/Rate Route Frequency Ordered Stop   05/12/16 1800  levofloxacin (LEVAQUIN) tablet 750 mg  Comments:  Levaquin 750 mg po q48h for CrCl < 50 mL/min   750 mg Oral Every 48 hours 05/11/16 2247 05/16/16 1759   05/11/16 1945  levofloxacin (LEVAQUIN) IVPB 500 mg     500 mg 100 mL/hr over 60 Minutes Intravenous  Once 05/11/16 1939 05/11/16 2209        Subjective:   Denise Adams was seen and examined today. Still somewhat weak and coughing. No fevers. Complaining of pain in the right hip. Patient denies dizziness, chest pain,  abdominal pain, N/V/D/C, new weakness, numbess, tingling. No acute events overnight.    Objective:   Vitals:   05/11/16 1745 05/12/16 0500 05/12/16 0550 05/12/16 1122  BP: 123/72  (!) 137/53   Pulse: 80  82   Resp: 26  18   Temp:   98.2 F (36.8 C)   TempSrc:   Oral   SpO2: 95%  92% 94%  Weight:  42.1 kg (92 lb 13 oz)    Height:  5\' 3"  (1.6 m)      Intake/Output Summary (Last 24 hours) at 05/12/16 1133 Last data  filed at 05/11/16 2209  Gross per 24 hour  Intake             1200 ml  Output                0 ml  Net             1200 ml     Wt Readings from Last 3 Encounters:  05/12/16 42.1 kg (92 lb 13 oz)  09/21/15 43.3 kg (95 lb 6.4 oz)  09/15/14 45.5 kg (100 lb 6.4 oz)     Exam  General: Alert and oriented x 3, NAD  HEENT:  PERRLA, EOMI, Anicteric Sclera, mucous membranes moist.   Neck: Supple, no JVD, no masses  Cardiovascular: S1 S2 auscultated, no rubs, murmurs or gallops. Regular rate and rhythm.  Respiratory: Mild expiratory wheezing  Gastrointestinal: Soft, nontender, nondistended, + bowel sounds  Ext: no cyanosis clubbing or edema. Range of motion normal in right hip  Neuro: AAOx3, Cr N's II- XII. Strength 5/5 upper and lower extremities bilaterally.   Skin: No rashes  Psych: Normal affect and demeanor, alert and oriented x3    Data Reviewed:  I have personally reviewed following labs and imaging studies  Micro Results No results found for this or any previous visit (from the past 240 hour(s)).  Radiology Reports X-ray Chest Pa And Lateral  Result Date: 05/12/2016 CLINICAL DATA:  Cough and congestion EXAM: CHEST  2 VIEW COMPARISON:  05/11/2016 FINDINGS: Cardiac shadow is stable. The lungs are hyper aerated bilaterally. The interstitial changes are noted bilaterally stable from the prior exam. Minimal atelectatic changes are noted in the anterior aspect of the right lower lobe. IMPRESSION: Minimal right basilar atelectasis. COPD Electronically Signed   By: Alcide Clever M.D.   On: 05/12/2016 09:58   Dg Chest 2 View  Result Date: 05/11/2016 CLINICAL DATA:  Nausea and cough EXAM: CHEST  2 VIEW COMPARISON:  05/25/2009 FINDINGS: Marked hyperinflation and mild cardiomegaly, unchanged. Diffuse emphysematous and fibrotic appearing changes are present throughout both lungs. No airspace consolidation. No effusion. Normal pulmonary vasculature. Hilar and mediastinal contours are  unremarkable and unchanged. IMPRESSION: Severe chronic changes, without evidence of a superimposed acute cardiopulmonary process Electronically Signed   By: Ellery Plunk M.D.   On: 05/11/2016 19:17    Lab Data:  CBC:  Recent Labs Lab 05/11/16 1647 05/12/16 1610  WBC 23.5* 11.4*  NEUTROABS 21.3*  --   HGB 12.2 10.2*  HCT 35.8* 30.3*  MCV 96.2 97.1  PLT 366 297   Basic Metabolic Panel:  Recent Labs Lab 05/11/16 1647 05/12/16 0650  NA 131* 132*  K 4.5 3.7  CL 93* 97*  CO2 25 26  GLUCOSE 121* 79  BUN 12 13  CREATININE 0.59 0.73  CALCIUM 9.2 8.4*   GFR: Estimated Creatinine Clearance: 36 mL/min (by C-G formula based on SCr of 0.73 mg/dL). Liver Function Tests:  Recent Labs Lab 05/11/16 1647 05/11/16 2321 05/12/16 0650  AST 46*  --  23  ALT 17  --  14  ALKPHOS 87  --  65  BILITOT 1.7* 1.0 1.4*  PROT 6.6  --  5.3*  ALBUMIN 3.3*  --  2.5*   No results for input(s): LIPASE, AMYLASE in the last 168 hours. No results for input(s): AMMONIA in the last 168 hours. Coagulation Profile: No results for input(s): INR, PROTIME in the last 168 hours. Cardiac Enzymes: No results for input(s): CKTOTAL, CKMB, CKMBINDEX, TROPONINI in the last 168 hours. BNP (last 3 results) No results for input(s): PROBNP in the last 8760 hours. HbA1C: No results for input(s): HGBA1C in the last 72 hours. CBG:  Recent Labs Lab 05/11/16 1647  GLUCAP 117*   Lipid Profile: No results for input(s): CHOL, HDL, LDLCALC, TRIG, CHOLHDL, LDLDIRECT in the last 72 hours. Thyroid Function Tests:  Recent Labs  05/11/16 2321  TSH 1.597   Anemia Panel: No results for input(s): VITAMINB12, FOLATE, FERRITIN, TIBC, IRON, RETICCTPCT in the last 72 hours. Urine analysis:    Component Value Date/Time   COLORURINE YELLOW 05/11/2016 1749   APPEARANCEUR CLEAR 05/11/2016 1749   LABSPEC 1.014 05/11/2016 1749   PHURINE 8.0 05/11/2016 1749   GLUCOSEU NEGATIVE 05/11/2016 1749   HGBUR NEGATIVE  05/11/2016 1749   BILIRUBINUR NEGATIVE 05/11/2016 1749   KETONESUR NEGATIVE 05/11/2016 1749   PROTEINUR 30 (A) 05/11/2016 1749   UROBILINOGEN 0.2 05/25/2009 1224   NITRITE NEGATIVE 05/11/2016 1749   LEUKOCYTESUR NEGATIVE 05/11/2016 1749     RAI,RIPUDEEP M.D. [facility] 05/12/2016, 11:33 AM  Pager: [phone] Between 7am to 7pm - call Pager - [phone]  After 7pm go to [email or web address] - password [email or web address]  Call night coverage person covering after 7pm

## 2016-05-13 DIAGNOSIS — R7989 Other specified abnormal findings of blood chemistry: Secondary | ICD-10-CM | POA: Diagnosis not present

## 2016-05-13 DIAGNOSIS — E871 Hypo-osmolality and hyponatremia: Secondary | ICD-10-CM | POA: Diagnosis not present

## 2016-05-13 DIAGNOSIS — I10 Essential (primary) hypertension: Secondary | ICD-10-CM | POA: Diagnosis not present

## 2016-05-13 DIAGNOSIS — B349 Viral infection, unspecified: Secondary | ICD-10-CM | POA: Diagnosis not present

## 2016-05-13 LAB — CBC
HCT: 31.8 % — ABNORMAL LOW (ref 36.0–46.0)
HEMOGLOBIN: 10.6 g/dL — AB (ref 12.0–15.0)
MCH: 32.1 pg (ref 26.0–34.0)
MCHC: 33.3 g/dL (ref 30.0–36.0)
MCV: 96.4 fL (ref 78.0–100.0)
PLATELETS: 314 10*3/uL (ref 150–400)
RBC: 3.3 MIL/uL — AB (ref 3.87–5.11)
RDW: 13.2 % (ref 11.5–15.5)
WBC: 11.6 10*3/uL — AB (ref 4.0–10.5)

## 2016-05-13 LAB — BASIC METABOLIC PANEL
ANION GAP: 10 (ref 5–15)
BUN: 8 mg/dL (ref 6–20)
CHLORIDE: 94 mmol/L — AB (ref 101–111)
CO2: 30 mmol/L (ref 22–32)
Calcium: 9 mg/dL (ref 8.9–10.3)
Creatinine, Ser: 0.69 mg/dL (ref 0.44–1.00)
GFR calc Af Amer: >60 mL/min (ref >60)
GLUCOSE: 78 mg/dL (ref 65–99)
POTASSIUM: 4.1 mmol/L (ref 3.5–5.1)
Sodium: 134 mmol/L — ABNORMAL LOW (ref 135–145)

## 2016-05-13 LAB — PROCALCITONIN: Procalcitonin: <0.1 ng/mL

## 2016-05-13 MED ORDER — BENZONATATE 100 MG PO CAPS
100.0000 mg | ORAL_CAPSULE | Freq: Three times a day (TID) | ORAL | Status: DC
  Administered 2016-05-13: 100 mg via ORAL
  Filled 2016-05-13: qty 1

## 2016-05-13 MED ORDER — METHOCARBAMOL 500 MG PO TABS: 500.0000 mg | ORAL_TABLET | Freq: Three times a day (TID) | ORAL | Status: DC | PRN

## 2016-05-13 MED ORDER — PREDNISONE 20 MG PO TABS
40.0000 mg | ORAL_TABLET | Freq: Every day | ORAL | Status: DC
  Administered 2016-05-13: 40 mg via ORAL
  Filled 2016-05-13: qty 2

## 2016-05-13 MED ORDER — LEVOFLOXACIN 250 MG PO TABS: 250.0000 mg | ORAL_TABLET | Freq: Every day | ORAL | 0 refills | Status: DC

## 2016-05-13 MED ORDER — METHOCARBAMOL 500 MG PO TABS: 500.0000 mg | ORAL_TABLET | Freq: Three times a day (TID) | ORAL | 0 refills | Status: AC | PRN

## 2016-05-13 MED ORDER — PREDNISONE 10 MG PO TABS: ORAL_TABLET | ORAL | 0 refills | Status: DC

## 2016-05-13 MED ORDER — BENZONATATE 100 MG PO CAPS: 100.0000 mg | ORAL_CAPSULE | Freq: Three times a day (TID) | ORAL | 0 refills | Status: DC | PRN

## 2016-05-13 MED ORDER — BUDESONIDE-FORMOTEROL FUMARATE 160-4.5 MCG/ACT IN AERO
2.0000 | INHALATION_SPRAY | Freq: Two times a day (BID) | RESPIRATORY_TRACT | 5 refills | Status: AC
Start: 2016-05-13 — End: ?

## 2016-05-13 NOTE — Progress Notes (Signed)
Denise Adams to be D/C'd Home per MD order.  Discussed with the patient and all questions fully answered.  VSS, Skin clean, dry and intact without evidence of skin break down, no evidence of skin tears noted. IV catheter discontinued intact. Site without signs and symptoms of complications. Dressing and pressure applied.  An After Visit Summary was printed and given to the patient. Patient received prescription.  D/c education completed with patient/family including follow up instructions, medication list, d/c activities limitations if indicated, with other d/c instructions as indicated by MD - patient able to verbalize understanding, all questions fully answered.   Patient instructed to return to ED, call 911, or call MD for any changes in condition.   Patient escorted via WC, and D/C home via private auto.  Denise Adams 05/13/2016 11:12 AM

## 2016-05-13 NOTE — Discharge Summary (Signed)
Physician Discharge Summary   Patient ID: Denise Adams MRN: [id number] DOB/AGE: [birth year] 83 y.o.  Admit date: 05/11/2016 Discharge date: 05/13/2016  Primary Care Physician:  Denise Sauer, MD  Discharge Diagnoses:    . COPD exacerbation . Hypothyroidism, acquired . Hyponatremia . Elevated LFTs . Essential hypertension   Tobacco abuse   Right hip painSecondary to osteoarthritis   Consults: None  Recommendations for Outpatient Follow-up:  1. Recommend referral to orthopedics for degenerative right hip 2. Please repeat CBC/BMET at next visit   DIET: Heart healthy diet    Allergies:  No Known Allergies   DISCHARGE MEDICATIONS: Current Discharge Medication List    START taking these medications   Details  benzonatate (TESSALON) 100 MG capsule Take 1 capsule (100 mg total) by mouth 3 (three) times daily as needed for cough. Qty: 30 capsule, Refills: 0    levofloxacin (LEVAQUIN) 250 MG tablet Take 1 tablet (250 mg total) by mouth daily. X 5 days Qty: 5 tablet, Refills: 0    methocarbamol (ROBAXIN) 500 MG tablet Take 1 tablet (500 mg total) by mouth every 8 (eight) hours as needed for muscle spasms. Qty: 30 tablet, Refills: 0    predniSONE (DELTASONE) 10 MG tablet Prednisone dosing: Take  Prednisone 40mg  (4 tabs) x 3 days, then taper to 30mg  (3 tabs) x 3 days, then 20mg  (2 tabs) x 3days, then 10mg  (1 tab) x 3days, then OFF. Qty: 30 tablet, Refills: 0      CONTINUE these medications which have CHANGED   Details  budesonide-formoterol (SYMBICORT) 160-4.5 MCG/ACT inhaler Inhale 2 puffs into the lungs 2 (two) times daily. Qty: 1 Inhaler, Refills: 5      CONTINUE these medications which have NOT CHANGED   Details  albuterol (PROVENTIL HFA;VENTOLIN HFA) 108 (90 BASE) MCG/ACT inhaler Inhale 2 puffs into the lungs every 6 (six) hours as needed.      aspirin 325 MG tablet Take 325 mg by mouth daily.      atorvastatin (LIPITOR) 20 MG tablet Take 20 mg by mouth  daily. Reported on 09/21/2015    Cholecalciferol (VITAMIN D) 2000 UNITS CAPS Take 1 capsule by mouth daily.    cyanocobalamin 1000 MCG tablet Take 100 mcg by mouth daily. Reported on 09/21/2015    HYDROcodone-acetaminophen (NORCO) 7.5-325 MG per tablet Take 1 tablet by mouth every 6 (six) hours as needed for moderate pain.     levothyroxine (SYNTHROID, LEVOTHROID) 50 MCG tablet Take 50 mcg by mouth daily. Reported on 09/21/2015    LORazepam (ATIVAN) 0.5 MG tablet Take 0.5 mg by mouth every 6 (six) hours as needed. Reported on 09/21/2015    Lutein 20 MG CAPS Take 1 capsule by mouth daily. Reported on 09/21/2015    Multiple Vitamin (MULTIVITAMIN) capsule Take 1 capsule by mouth daily.      Multiple Vitamins-Minerals (PRESERVISION AREDS PO) Take 2 tablets by mouth daily.     olmesartan-hydrochlorothiazide (BENICAR HCT) 40-25 MG per tablet Reported on 09/21/2015    Omega-3 Fatty Acids (FISH OIL) 1000 MG CAPS Take by mouth. 1 po daily.  Mega Red Krill Oil    traZODone (DESYREL) 50 MG tablet Take 50 mg by mouth at bedtime.           Brief H and P: For complete details please refer to admission H and P, but in briefKatherine M Adams a 83 y.o.femalewith a past medical history significant for HTN, aortic stenosis, CAD and PVD and ongoing smokingwho presents with malaise and cough. Patient  reported that she got URI symptoms of congestion, cough. No fever, body aches or sputum or shortness of breath. She went to her PCP and who found his white count of >20K, recommended her to go to the ED. In ED, O2 sats were 89% on room air, slightly tachypneic, WBCs 20 3.5K, BNP 439  Hospital Course:   Acute COPD exacerbation precipitated due to viral syndrome versus early pneumonia Patient was admitted and placed on scheduled nebs, dulera, flutter valve, levaquin -She was found to have a white count of 23.5 thousand at the time of admission. UA was negative, influenza panel negative, respiratory virus  panel negative. -Chest x-ray showed minimal right basilar atelectasis with COPD. - Patient was placed on levofloxacin. Due to mild expiratory wheezing, patient was also placed on prednisone with taper. WBC count has improved to 11.6 at the time of discharge. Patient was also placed on Symbicort, counseled on tobacco cessation.   Elevated LFTs: Unclear significance, and abdominal exam benign.  - Improved  Hypertension: BP hypertensive at admission. -Continue ARB-HCT  Chronic diastolic CHF and aortic stenosis: No evidence of fluid overload on exam. Not on diuretic.   Hyponatremia: Mild. Unclear chronicity. Presumably hypovolemic, based on exam. On HCTZ.   Hypothyroidism: -Continue levothyroxine - TSH 1.5  Chronic pain: -Continue Norco PRN -Continue trazodone, BZD and citalopram  Right hip pain-Due to osteoarthritis - per daughter chronic issue -  right hip x-ray showed degenerative changes otherwise no fracture or dislocation. Patient was placed on pain medication, Robaxin. She was recommended to follow orthopedics outpatient.    Day of Discharge BP (!) 122/45 (BP Location: Left Arm)   Pulse 66   Temp 98.1 F (36.7 C)   Resp 19   Ht 5\' 3"  (1.6 m)   Wt 42.1 kg (92 lb 13 oz)   SpO2 93%   BMI 16.44 kg/m   Physical Exam: General: Alert and awake oriented x3 not in any acute distress. HEENT: anicteric sclera, pupils reactive to light and accommodation CVS: S1-S2 clear no murmur rubs or gallops Chest: Improving, decreased breath sounds at bases  Abdomen: soft nontender, nondistended, normal bowel sounds Extremities: no cyanosis, clubbing or edema noted bilaterally Neuro: Cranial nerves II-XII intact, no focal neurological deficits   The results of significant diagnostics from this hospitalization (including imaging, microbiology, ancillary and laboratory) are listed below for reference.    LAB RESULTS: Basic Metabolic Panel:  Recent Labs Lab  05/12/16 0650 05/13/16 0518  NA 132* 134*  K 3.7 4.1  CL 97* 94*  CO2 26 30  GLUCOSE 79 78  BUN 13 8  CREATININE 0.73 0.69  CALCIUM 8.4* 9.0   Liver Function Tests:  Recent Labs Lab 05/11/16 1647 05/11/16 2321 05/12/16 0650  AST 46*  --  23  ALT 17  --  14  ALKPHOS 87  --  65  BILITOT 1.7* 1.0 1.4*  PROT 6.6  --  5.3*  ALBUMIN 3.3*  --  2.5*   No results for input(s): LIPASE, AMYLASE in the last 168 hours. No results for input(s): AMMONIA in the last 168 hours. CBC:  Recent Labs Lab 05/11/16 1647 05/12/16 0650 05/13/16 0518  WBC 23.5* 11.4* 11.6*  NEUTROABS 21.3*  --   --   HGB 12.2 10.2* 10.6*  HCT 35.8* 30.3* 31.8*  MCV 96.2 97.1 96.4  PLT 366 297 314   Cardiac Enzymes: No results for input(s): CKTOTAL, CKMB, CKMBINDEX, TROPONINI in the last 168 hours. BNP: Invalid input(s): POCBNP CBG:  Recent Labs Lab 05/11/16 1647  GLUCAP 117*    Significant Diagnostic Studies:  X-ray Chest Pa And Lateral  Result Date: 05/12/2016 CLINICAL DATA:  Cough and congestion EXAM: CHEST  2 VIEW COMPARISON:  05/11/2016 FINDINGS: Cardiac shadow is stable. The lungs are hyper aerated bilaterally. The interstitial changes are noted bilaterally stable from the prior exam. Minimal atelectatic changes are noted in the anterior aspect of the right lower lobe. IMPRESSION: Minimal right basilar atelectasis. COPD Electronically Signed   By: Alcide CleverMark  Lukens M.D.   On: 05/12/2016 09:58   Dg Chest 2 View  Result Date: 05/11/2016 CLINICAL DATA:  Nausea and cough EXAM: CHEST  2 VIEW COMPARISON:  05/25/2009 FINDINGS: Marked hyperinflation and mild cardiomegaly, unchanged. Diffuse emphysematous and fibrotic appearing changes are present throughout both lungs. No airspace consolidation. No effusion. Normal pulmonary vasculature. Hilar and mediastinal contours are unremarkable and unchanged. IMPRESSION: Severe chronic changes, without evidence of a superimposed acute cardiopulmonary process  Electronically Signed   By: Ellery Plunkaniel R Mitchell M.D.   On: 05/11/2016 19:17   Dg Hip Unilat With Pelvis 2-3 Views Right  Result Date: 05/12/2016 CLINICAL DATA:  Right hip pain for 3 days, no known injury, initial encounter EXAM: DG HIP (WITH OR WITHOUT PELVIS) 3V RIGHT COMPARISON:  None. FINDINGS: Pelvic ring is intact. No acute fracture is seen. Mild degenerative changes of the hip joints are noted bilaterally. No soft tissue abnormality is noted. Prior right iliac stenting is noted. IMPRESSION: Degenerative change without acute abnormality. Electronically Signed   By: Alcide CleverMark  Lukens M.D.   On: 05/12/2016 12:39    2D ECHO:   Disposition and Follow-up: Discharge Instructions    Diet - low sodium heart healthy    Complete by:  As directed    Discharge instructions    Complete by:  As directed    Please continue albuterol inhaler 3 times a day for next 3 days then as needed every 4- 6 hours   Increase activity slowly    Complete by:  As directed        DISPOSITION: Home   DISCHARGE FOLLOW-UP Follow-up Information    AVVA,RAVISANKAR R, MD. Schedule an appointment as soon as possible for a visit in 10 day(s).   Specialty:  Internal Medicine Contact information: [address] [place] [address] [phone]            Time spent on Discharge: 25mins   Signed:   Elizebeth Kluesner M.D. [facility] 05/13/2016, 11:59 AM Pager: [phone]

## 2016-05-24 ENCOUNTER — Other Ambulatory Visit: Payer: Self-pay | Admitting: Internal Medicine

## 2016-05-24 DIAGNOSIS — R519 Headache, unspecified: Secondary | ICD-10-CM

## 2016-05-24 DIAGNOSIS — R51 Headache: Principal | ICD-10-CM

## 2016-05-25 ENCOUNTER — Ambulatory Visit
Admission: RE | Admit: 2016-05-25 | Discharge: 2016-05-25 | Disposition: A | Discharge status: Home / Self Care | Payer: Medicare Other | Source: Ambulatory Visit | Attending: Internal Medicine | Admitting: Internal Medicine

## 2016-05-25 DIAGNOSIS — R519 Headache, unspecified: Secondary | ICD-10-CM

## 2016-05-25 DIAGNOSIS — R51 Headache: Principal | ICD-10-CM

## 2016-06-19 ENCOUNTER — Emergency Department (HOSPITAL_COMMUNITY): Payer: Medicare Other

## 2016-06-19 ENCOUNTER — Encounter (HOSPITAL_COMMUNITY): Payer: Self-pay | Admitting: Emergency Medicine

## 2016-06-19 ENCOUNTER — Inpatient Hospital Stay (HOSPITAL_COMMUNITY)
Admission: EM | Admit: 2016-06-19 | Discharge: 2016-06-26 | DRG: 871 | Disposition: A | Discharge status: Home Health | Payer: Medicare Other | Attending: Internal Medicine | Admitting: Internal Medicine

## 2016-06-19 DIAGNOSIS — I739 Peripheral vascular disease, unspecified: Secondary | ICD-10-CM | POA: Diagnosis not present

## 2016-06-19 DIAGNOSIS — F329 Major depressive disorder, single episode, unspecified: Secondary | ICD-10-CM | POA: Diagnosis present

## 2016-06-19 DIAGNOSIS — J9601 Acute respiratory failure with hypoxia: Secondary | ICD-10-CM | POA: Diagnosis present

## 2016-06-19 DIAGNOSIS — G8929 Other chronic pain: Secondary | ICD-10-CM | POA: Diagnosis present

## 2016-06-19 DIAGNOSIS — E785 Hyperlipidemia, unspecified: Secondary | ICD-10-CM

## 2016-06-19 DIAGNOSIS — E876 Hypokalemia: Secondary | ICD-10-CM | POA: Diagnosis present

## 2016-06-19 DIAGNOSIS — D72829 Elevated white blood cell count, unspecified: Secondary | ICD-10-CM | POA: Diagnosis not present

## 2016-06-19 DIAGNOSIS — J189 Pneumonia, unspecified organism: Secondary | ICD-10-CM | POA: Diagnosis not present

## 2016-06-19 DIAGNOSIS — T502X5A Adverse effect of carbonic-anhydrase inhibitors, benzothiadiazides and other diuretics, initial encounter: Secondary | ICD-10-CM | POA: Diagnosis present

## 2016-06-19 DIAGNOSIS — D649 Anemia, unspecified: Secondary | ICD-10-CM | POA: Diagnosis present

## 2016-06-19 DIAGNOSIS — G2581 Restless legs syndrome: Secondary | ICD-10-CM | POA: Diagnosis present

## 2016-06-19 DIAGNOSIS — Z681 Body mass index (BMI) 19 or less, adult: Secondary | ICD-10-CM

## 2016-06-19 DIAGNOSIS — F17210 Nicotine dependence, cigarettes, uncomplicated: Secondary | ICD-10-CM | POA: Diagnosis present

## 2016-06-19 DIAGNOSIS — E872 Acidosis: Secondary | ICD-10-CM | POA: Diagnosis present

## 2016-06-19 DIAGNOSIS — E871 Hypo-osmolality and hyponatremia: Secondary | ICD-10-CM | POA: Diagnosis present

## 2016-06-19 DIAGNOSIS — I248 Other forms of acute ischemic heart disease: Secondary | ICD-10-CM | POA: Diagnosis present

## 2016-06-19 DIAGNOSIS — F411 Generalized anxiety disorder: Secondary | ICD-10-CM

## 2016-06-19 DIAGNOSIS — I714 Abdominal aortic aneurysm, without rupture, unspecified: Secondary | ICD-10-CM | POA: Diagnosis present

## 2016-06-19 DIAGNOSIS — A419 Sepsis, unspecified organism: Principal | ICD-10-CM | POA: Diagnosis present

## 2016-06-19 DIAGNOSIS — Z79899 Other long term (current) drug therapy: Secondary | ICD-10-CM

## 2016-06-19 DIAGNOSIS — Z7982 Long term (current) use of aspirin: Secondary | ICD-10-CM

## 2016-06-19 DIAGNOSIS — Z7951 Long term (current) use of inhaled steroids: Secondary | ICD-10-CM

## 2016-06-19 DIAGNOSIS — J441 Chronic obstructive pulmonary disease with (acute) exacerbation: Secondary | ICD-10-CM | POA: Diagnosis present

## 2016-06-19 DIAGNOSIS — I110 Hypertensive heart disease with heart failure: Secondary | ICD-10-CM | POA: Diagnosis present

## 2016-06-19 DIAGNOSIS — R52 Pain, unspecified: Secondary | ICD-10-CM

## 2016-06-19 DIAGNOSIS — E43 Unspecified severe protein-calorie malnutrition: Secondary | ICD-10-CM | POA: Diagnosis present

## 2016-06-19 DIAGNOSIS — I5032 Chronic diastolic (congestive) heart failure: Secondary | ICD-10-CM | POA: Diagnosis present

## 2016-06-19 DIAGNOSIS — Y95 Nosocomial condition: Secondary | ICD-10-CM | POA: Diagnosis present

## 2016-06-19 DIAGNOSIS — J440 Chronic obstructive pulmonary disease with acute lower respiratory infection: Secondary | ICD-10-CM | POA: Diagnosis present

## 2016-06-19 DIAGNOSIS — I350 Nonrheumatic aortic (valve) stenosis: Secondary | ICD-10-CM | POA: Diagnosis present

## 2016-06-19 DIAGNOSIS — Z720 Tobacco use: Secondary | ICD-10-CM | POA: Diagnosis present

## 2016-06-19 DIAGNOSIS — I252 Old myocardial infarction: Secondary | ICD-10-CM | POA: Diagnosis not present

## 2016-06-19 DIAGNOSIS — J181 Lobar pneumonia, unspecified organism: Secondary | ICD-10-CM | POA: Diagnosis present

## 2016-06-19 LAB — COMPREHENSIVE METABOLIC PANEL
ALT: 19 U/L (ref 14–54)
AST: 30 U/L (ref 15–41)
Albumin: 3.2 g/dL — ABNORMAL LOW (ref 3.5–5.0)
Alkaline Phosphatase: 83 U/L (ref 38–126)
Anion gap: 12 (ref 5–15)
BUN: 10 mg/dL (ref 6–20)
CO2: 27 mmol/L (ref 22–32)
Calcium: 9 mg/dL (ref 8.9–10.3)
Chloride: 91 mmol/L — ABNORMAL LOW (ref 101–111)
Creatinine, Ser: 0.68 mg/dL (ref 0.44–1.00)
GFR calc Af Amer: >60 mL/min (ref >60)
GFR calc non Af Amer: >60 mL/min (ref >60)
Glucose, Bld: 118 mg/dL — ABNORMAL HIGH (ref 65–99)
Potassium: 4 mmol/L (ref 3.5–5.1)
SODIUM: 130 mmol/L — AB (ref 135–145)
Total Bilirubin: 0.9 mg/dL (ref 0.3–1.2)
Total Protein: 5.7 g/dL — ABNORMAL LOW (ref 6.5–8.1)

## 2016-06-19 LAB — CBC WITH DIFFERENTIAL/PLATELET
BASOS ABS: 0 10*3/uL (ref 0.0–0.1)
Basophils Relative: 0 %
EOS ABS: 0 10*3/uL (ref 0.0–0.7)
Eosinophils Relative: 0 %
HCT: 33.9 % — ABNORMAL LOW (ref 36.0–46.0)
Hemoglobin: 11.3 g/dL — ABNORMAL LOW (ref 12.0–15.0)
Lymphocytes Relative: 5 %
Lymphs Abs: 1.5 10*3/uL (ref 0.7–4.0)
MCH: 32.7 pg (ref 26.0–34.0)
MCHC: 33.3 g/dL (ref 30.0–36.0)
MCV: 98 fL (ref 78.0–100.0)
MONO ABS: 1.7 10*3/uL — AB (ref 0.1–1.0)
Monocytes Relative: 6 %
Neutro Abs: 25.8 10*3/uL — ABNORMAL HIGH (ref 1.7–7.7)
Neutrophils Relative %: 89 %
PLATELETS: 378 10*3/uL (ref 150–400)
RBC: 3.46 MIL/uL — AB (ref 3.87–5.11)
RDW: 14 % (ref 11.5–15.5)
WBC: 29 10*3/uL — AB (ref 4.0–10.5)

## 2016-06-19 LAB — I-STAT VENOUS BLOOD GAS, ED
ACID-BASE EXCESS: 7 mmol/L — AB (ref 0.0–2.0)
Bicarbonate: 31.8 mmol/L — ABNORMAL HIGH (ref 20.0–28.0)
O2 Saturation: 97 %
TCO2: 33 mmol/L (ref 0–100)
pCO2, Ven: 44.4 mmHg (ref 44.0–60.0)
pH, Ven: 7.462 — ABNORMAL HIGH (ref 7.250–7.430)
pO2, Ven: 82 mmHg — ABNORMAL HIGH (ref 32.0–45.0)

## 2016-06-19 LAB — LACTIC ACID, PLASMA: LACTIC ACID, VENOUS: 2.8 mmol/L — AB (ref 0.5–1.9)

## 2016-06-19 LAB — PROCALCITONIN: PROCALCITONIN: 0.1 ng/mL

## 2016-06-19 LAB — I-STAT TROPONIN, ED: Troponin i, poc: 0.01 ng/mL (ref 0.00–0.08)

## 2016-06-19 LAB — BRAIN NATRIURETIC PEPTIDE: B Natriuretic Peptide: 285.3 pg/mL — ABNORMAL HIGH (ref 0.0–100.0)

## 2016-06-19 MED ORDER — ONDANSETRON HCL 4 MG/2ML IJ SOLN: 4.0000 mg | Freq: Four times a day (QID) | INTRAMUSCULAR | Status: DC | PRN

## 2016-06-19 MED ORDER — LORAZEPAM 1 MG PO TABS
1.0000 mg | ORAL_TABLET | Freq: Once | ORAL | Status: AC
  Administered 2016-06-19: 1 mg via ORAL
  Filled 2016-06-19: qty 1

## 2016-06-19 MED ORDER — IPRATROPIUM-ALBUTEROL 0.5-2.5 (3) MG/3ML IN SOLN
3.0000 mL | Freq: Four times a day (QID) | RESPIRATORY_TRACT | Status: DC
  Administered 2016-06-19 – 2016-06-25 (×22): 3 mL via RESPIRATORY_TRACT
  Filled 2016-06-19 (×22): qty 3

## 2016-06-19 MED ORDER — ENOXAPARIN SODIUM 30 MG/0.3ML ~~LOC~~ SOLN
20.0000 mg | SUBCUTANEOUS | Status: DC
  Filled 2016-06-19: qty 0.2

## 2016-06-19 MED ORDER — ALBUTEROL (5 MG/ML) CONTINUOUS INHALATION SOLN
5.0000 mg/h | INHALATION_SOLUTION | RESPIRATORY_TRACT | Status: AC
  Administered 2016-06-19: 5 mg/h via RESPIRATORY_TRACT
  Filled 2016-06-19: qty 20

## 2016-06-19 MED ORDER — BUDESONIDE 0.5 MG/2ML IN SUSP
0.5000 mg | Freq: Two times a day (BID) | RESPIRATORY_TRACT | Status: DC
  Administered 2016-06-19 – 2016-06-26 (×14): 0.5 mg via RESPIRATORY_TRACT
  Filled 2016-06-19 (×14): qty 2

## 2016-06-19 MED ORDER — ESCITALOPRAM OXALATE 10 MG PO TABS
5.0000 mg | ORAL_TABLET | Freq: Every day | ORAL | Status: DC
  Administered 2016-06-19 – 2016-06-25 (×7): 5 mg via ORAL
  Filled 2016-06-19 (×7): qty 1

## 2016-06-19 MED ORDER — SODIUM CHLORIDE 0.9 % IV BOLUS (SEPSIS)
1000.0000 mL | Freq: Once | INTRAVENOUS | Status: AC
  Administered 2016-06-19: 1000 mL via INTRAVENOUS

## 2016-06-19 MED ORDER — ONDANSETRON HCL 4 MG PO TABS: 4.0000 mg | ORAL_TABLET | Freq: Four times a day (QID) | ORAL | Status: DC | PRN

## 2016-06-19 MED ORDER — ACETAMINOPHEN 650 MG RE SUPP: 650.0000 mg | Freq: Four times a day (QID) | RECTAL | Status: DC | PRN

## 2016-06-19 MED ORDER — LEVOFLOXACIN IN D5W 500 MG/100ML IV SOLN
500.0000 mg | Freq: Once | INTRAVENOUS | Status: DC
Start: 2016-06-19 — End: 2016-06-19
  Administered 2016-06-19: 500 mg via INTRAVENOUS
  Filled 2016-06-19: qty 100

## 2016-06-19 MED ORDER — METHYLPREDNISOLONE SODIUM SUCC 125 MG IJ SOLR
60.0000 mg | Freq: Three times a day (TID) | INTRAMUSCULAR | Status: DC
  Administered 2016-06-20: 60 mg via INTRAVENOUS
  Filled 2016-06-19: qty 2

## 2016-06-19 MED ORDER — VANCOMYCIN HCL IN DEXTROSE 750-5 MG/150ML-% IV SOLN: 750.0000 mg | INTRAVENOUS | Status: DC

## 2016-06-19 MED ORDER — HYDROCHLOROTHIAZIDE 25 MG PO TABS: 25.0000 mg | ORAL_TABLET | Freq: Every day | ORAL | Status: DC

## 2016-06-19 MED ORDER — ASPIRIN EC 81 MG PO TBEC
81.0000 mg | DELAYED_RELEASE_TABLET | Freq: Every day | ORAL | Status: DC
  Administered 2016-06-20 – 2016-06-26 (×7): 81 mg via ORAL
  Filled 2016-06-19 (×7): qty 1

## 2016-06-19 MED ORDER — ACETAMINOPHEN 325 MG PO TABS
650.0000 mg | ORAL_TABLET | Freq: Once | ORAL | Status: AC
  Administered 2016-06-19: 650 mg via ORAL
  Filled 2016-06-19: qty 2

## 2016-06-19 MED ORDER — LORAZEPAM 0.5 MG PO TABS
0.5000 mg | ORAL_TABLET | Freq: Three times a day (TID) | ORAL | Status: DC
  Administered 2016-06-19 – 2016-06-26 (×19): 0.5 mg via ORAL
  Filled 2016-06-19 (×19): qty 1

## 2016-06-19 MED ORDER — LEVOFLOXACIN IN D5W 500 MG/100ML IV SOLN: 500.0000 mg | INTRAVENOUS | Status: DC

## 2016-06-19 MED ORDER — NICOTINE 21 MG/24HR TD PT24
21.0000 mg | MEDICATED_PATCH | Freq: Every day | TRANSDERMAL | Status: DC | PRN
  Administered 2016-06-24: 21 mg via TRANSDERMAL
  Filled 2016-06-19: qty 1

## 2016-06-19 MED ORDER — ENSURE ENLIVE PO LIQD
237.0000 mL | Freq: Two times a day (BID) | ORAL | Status: DC
  Administered 2016-06-20 – 2016-06-26 (×11): 237 mL via ORAL

## 2016-06-19 MED ORDER — CEFEPIME HCL 2 G IJ SOLR
2.0000 g | INTRAMUSCULAR | Status: DC
  Administered 2016-06-19 – 2016-06-25 (×7): 2 g via INTRAVENOUS
  Filled 2016-06-19 (×7): qty 2

## 2016-06-19 MED ORDER — GUAIFENESIN ER 600 MG PO TB12
600.0000 mg | ORAL_TABLET | Freq: Two times a day (BID) | ORAL | Status: DC
  Administered 2016-06-19 – 2016-06-26 (×14): 600 mg via ORAL
  Filled 2016-06-19 (×14): qty 1

## 2016-06-19 MED ORDER — IPRATROPIUM-ALBUTEROL 0.5-2.5 (3) MG/3ML IN SOLN: 3.0000 mL | RESPIRATORY_TRACT | Status: DC | PRN

## 2016-06-19 MED ORDER — HYDROCODONE-ACETAMINOPHEN 7.5-325 MG PO TABS
1.0000 | ORAL_TABLET | Freq: Four times a day (QID) | ORAL | Status: DC | PRN
  Administered 2016-06-19 – 2016-06-20 (×3): 1 via ORAL
  Filled 2016-06-19 (×5): qty 1

## 2016-06-19 MED ORDER — ACETAMINOPHEN 325 MG PO TABS
650.0000 mg | ORAL_TABLET | Freq: Four times a day (QID) | ORAL | Status: DC | PRN
  Administered 2016-06-25 (×2): 650 mg via ORAL
  Filled 2016-06-19 (×2): qty 2

## 2016-06-19 MED ORDER — ARFORMOTEROL TARTRATE 15 MCG/2ML IN NEBU
15.0000 ug | INHALATION_SOLUTION | Freq: Two times a day (BID) | RESPIRATORY_TRACT | Status: DC
  Administered 2016-06-19 – 2016-06-26 (×14): 15 ug via RESPIRATORY_TRACT
  Filled 2016-06-19 (×14): qty 2

## 2016-06-19 MED ORDER — METHOCARBAMOL 500 MG PO TABS
500.0000 mg | ORAL_TABLET | Freq: Three times a day (TID) | ORAL | Status: DC | PRN
  Administered 2016-06-19 – 2016-06-25 (×6): 500 mg via ORAL
  Filled 2016-06-19 (×7): qty 1

## 2016-06-19 MED ORDER — VANCOMYCIN HCL IN DEXTROSE 1-5 GM/200ML-% IV SOLN
1000.0000 mg | Freq: Once | INTRAVENOUS | Status: AC
  Administered 2016-06-19: 1000 mg via INTRAVENOUS
  Filled 2016-06-19: qty 200

## 2016-06-19 NOTE — Progress Notes (Addendum)
Pharmacy Antibiotic Note  Denise Adams is a 83 y.o. female admitted on 06/19/2016 with pneumonia.  Pharmacy has been consulted for levofloxacin dosing.  Patient presented with complaint of worsening shortness of breath. She is afebrile with elevated WBC count of 29.0 k/uL. Serum creatinine is around baseline, with estimated CrCL ~30-40 mL/min.  Plan: Levofloxacin 500mg  IV every 24 hours Monitor renal function, clinical progress Avoid other QTc prolonging agents    Temp (24hrs), Avg:98.9 F (37.2 C), Min:98.9 F (37.2 C), Max:98.9 F (37.2 C)   Recent Labs Lab 06/19/16 1920  WBC 29.0*    CrCl cannot be calculated (Patient's most recent lab result is older than the maximum 21 days allowed.).    No Known Allergies  Antimicrobials this admission: 3/12 levofloxacin >>   Dose adjustments this admission: N/A  Microbiology results: None  Thank you for allowing pharmacy to be a part of this patient's care.  Carylon PerchesMaggie Shuda, PharmD Acute Care Pharmacy Resident  Pager: [phone] 06/19/2016   __________________________________________________ Antibiotics changed to cefepime and vancomycin.   Plan: Vancomycin 1000mg  IV x1, then 750mg  IV every 24 hours Cefepime 2gm IV every 24 hours Monitor renal function, clinical progress, VT as indicated Received levofloxacin x1 in ED

## 2016-06-19 NOTE — ED Notes (Signed)
Report attempted 

## 2016-06-19 NOTE — ED Notes (Signed)
Patient is still restless in bed complaining of leg pain.  Stated that the tylenol did not help the pain.  HR still in the 120s.  No respiratory distress noted at this time

## 2016-06-19 NOTE — ED Notes (Signed)
Smith MD at bedside.  Will transport patient once he is done.

## 2016-06-19 NOTE — ED Notes (Signed)
Pt c/o pain and also appears very anxious; Irick MD notified

## 2016-06-19 NOTE — Progress Notes (Signed)
RT started CAT neb treatment per md verbal order. Patient had increased HR. MD said ok to give treatment

## 2016-06-19 NOTE — ED Notes (Signed)
Patient is stable and ready to be transport to the floor at this time.  Report was called to Wende Bushyhris 4E RN.  Belongings taken with the patient to the floor.

## 2016-06-19 NOTE — Progress Notes (Signed)
Schedule neb given

## 2016-06-19 NOTE — ED Notes (Signed)
MD at bedside. 

## 2016-06-19 NOTE — ED Provider Notes (Signed)
[facility]-EMERGENCY DEPT Provider Note   CSN: [id number] Arrival date & time: 06/19/16  1907     History   Chief Complaint Chief Complaint  Patient presents with  . Respiratory Distress    HPI Denise Adams is a 83 y.o. female.  HPI  83 year old female with history of COPD not on home oxygen, DM 2, aortic stenosis, HLD, HTN, who presents in respiratory distress. Patient reports developing increased work of breathing about a week ago. Symptoms occur both at rest and with exertion. Denies chest pain. Endorses chronic cough, which was initially productive of yellow sputum, but reports that after taking Mucinex 2 days ago she's no longer able to cough anything up. Denies fevers or chills. No nausea, vomiting, diaphoresis. Denies urinary symptoms, including dysuria or frequency. No abdominal pain. She has been using her home inhalers without improvement. Of note, patient still smokes. States that she came in today when she felt like she wasn't getting enough air.  Patient was reportedly satting 86% on room air when EMS arrived at her home. She received 125 mg of soluMedrol and 2 duonebs en route to the ED.   Past Medical History:  Diagnosis Date  . Aortic stenosis   . COPD (chronic obstructive pulmonary disease) (HCC)   . Diabetes mellitus   . HLD (hyperlipidemia)   . HTN (hypertension)   . Myocardial infarction   . PVD (peripheral vascular disease) [facility])     Patient Active Problem List   Diagnosis Date Noted  . HCAP (healthcare-associated pneumonia) 06/19/2016  . Viral syndrome 05/11/2016  . Hypothyroidism, acquired 05/11/2016  . Hyponatremia 05/11/2016  . Elevated LFTs 05/11/2016  . Carotid artery stenosis 09/21/2015  . Occlusion and stenosis of carotid artery without mention of cerebral infarction 09/10/2012  . Abdominal aortic aneurysm (HCC) 08/23/2010  . Nonspecific abnormal unspecified cardiovascular function study 08/23/2010  . Aortic stenosis 06/30/2010  . Shortness  of breath dyspnea 06/30/2010  . Abdominal bruit 06/30/2010  . Tobacco abuse 06/30/2010  . Essential hypertension   . HLD (hyperlipidemia)   . Diabetes mellitus   . PVD (peripheral vascular disease) (HCC)     Past Surgical History:  Procedure Laterality Date  . ABDOMINAL HYSTERECTOMY    . APPENDECTOMY    . CAROTID ENDARTERECTOMY     Left  . CATARACT EXTRACTION    . FOOT SURGERY     right  . TONSILLECTOMY      OB History    No data available       Home Medications    Prior to Admission medications   Medication Sig Start Date End Date Taking? Authorizing Provider  albuterol (PROVENTIL HFA;VENTOLIN HFA) 108 (90 BASE) MCG/ACT inhaler Inhale 2 puffs into the lungs every 6 (six) hours as needed for shortness of breath.    Yes Historical Provider, MD  aspirin EC 81 MG tablet Take 81 mg by mouth daily.   Yes Historical Provider, MD  budesonide-formoterol (SYMBICORT) 160-4.5 MCG/ACT inhaler Inhale 2 puffs into the lungs 2 (two) times daily. 05/13/16  Yes Ripudeep Jenna Luo, MD  Cholecalciferol (VITAMIN D) 2000 UNITS CAPS Take 2,000 Units by mouth daily.    Yes Historical Provider, MD  cyanocobalamin 1000 MCG tablet Take 1,000 mcg by mouth daily. Reported on 09/21/2015   Yes Historical Provider, MD  escitalopram (LEXAPRO) 5 MG tablet Take 5 mg by mouth at bedtime.  06/18/16  Yes Historical Provider, MD  hydrochlorothiazide (HYDRODIURIL) 25 MG tablet Take 25 mg by mouth daily. 05/27/16  Yes  Historical Provider, MD  HYDROcodone-acetaminophen (NORCO) 7.5-325 MG per tablet Take 1 tablet by mouth every 6 (six) hours as needed for moderate pain.    Yes Historical Provider, MD  Boris Lown Oil 1000 MG CAPS Take 1,000 mg by mouth daily.   Yes Historical Provider, MD  LORazepam (ATIVAN) 0.5 MG tablet Take 0.5 mg by mouth every 8 (eight) hours. Reported on 09/21/2015   Yes Historical Provider, MD  Multiple Vitamins-Minerals (PRESERVISION AREDS PO) Take 1 tablet by mouth 2 (two) times daily.    Yes Historical  Provider, MD  methocarbamol (ROBAXIN) 500 MG tablet Take 1 tablet (500 mg total) by mouth every 8 (eight) hours as needed for muscle spasms. Patient not taking: Reported on 06/19/2016 05/13/16   Ripudeep Jenna Luo, MD    Family History Family History  Problem Relation Age of Onset  . Coronary artery disease Father   . Coronary artery disease Brother 74  . Coronary artery disease Brother 84    Social History Social History  Substance Use Topics  . Smoking status: Current Every Day Smoker    Packs/day: 0.75    Years: 60.00    Types: Cigarettes  . Smokeless tobacco: Never Used  . Alcohol use No     Allergies   Patient has no known allergies.   Review of Systems Review of Systems  Constitutional: Negative for chills and fever.  HENT: Negative for congestion, rhinorrhea and sore throat.   Eyes: Negative for visual disturbance.  Respiratory: Positive for cough, shortness of breath and wheezing.   Cardiovascular: Negative for chest pain, palpitations and leg swelling.  Gastrointestinal: Negative for abdominal distention, abdominal pain, blood in stool, constipation, diarrhea, nausea and vomiting.  Genitourinary: Negative for dysuria, flank pain and frequency.  Musculoskeletal: Negative for arthralgias, back pain, gait problem, joint swelling, myalgias, neck pain and neck stiffness.  Skin: Negative for rash.  Neurological: Negative for dizziness, tremors, syncope, facial asymmetry, speech difficulty, weakness, numbness and headaches.  Psychiatric/Behavioral: Negative for agitation, behavioral problems and confusion.     Physical Exam Updated Vital Signs BP (!) 98/54 (BP Location: Right Arm)   Pulse (!) 125   Temp 98.1 F (36.7 C) (Oral)   Resp (!) 31   Ht 5\' 3"  (1.6 m)   Wt 42.6 kg   SpO2 92%   BMI 16.64 kg/m   Physical Exam  Constitutional: She is oriented to person, place, and time. No distress.  thin  HENT:  Head: Normocephalic and atraumatic.  Right Ear: External  ear normal.  Left Ear: External ear normal.  Nose: Nose normal.  Mouth/Throat: Oropharynx is clear and moist. No oropharyngeal exudate.  Eyes: Conjunctivae and EOM are normal. Pupils are equal, round, and reactive to light. Right eye exhibits no discharge. Left eye exhibits no discharge.  Neck: Normal range of motion. Neck supple.  Cardiovascular: Regular rhythm, normal heart sounds and intact distal pulses.  Exam reveals no gallop and no friction rub.   No murmur heard. Tachycardic to 115 bpm  Pulmonary/Chest: She is in respiratory distress.  Increased work of breathing, with subcostal and supraclavicular retractions. Poor air movement throughout all lung fields. Diffuse wheezing. No crackles, rales, or rhonchi.   Abdominal: Soft. Bowel sounds are normal. She exhibits no distension. There is no tenderness. There is no guarding.  Musculoskeletal: Normal range of motion. She exhibits no edema or tenderness.  Neurological: She is alert and oriented to person, place, and time. She exhibits normal muscle tone.  Skin: Skin is warm  and dry. No rash noted. She is not diaphoretic.  Psychiatric: She has a normal mood and affect. Her behavior is normal. Judgment and thought content normal.     ED Treatments / Results  Labs (all labs ordered are listed, but only abnormal results are displayed) Labs Reviewed  CBC WITH DIFFERENTIAL/PLATELET - Abnormal; Notable for the following:       Result Value   WBC 29.0 (*)    RBC 3.46 (*)    Hemoglobin 11.3 (*)    HCT 33.9 (*)    Neutro Abs 25.8 (*)    Monocytes Absolute 1.7 (*)    All other components within normal limits  COMPREHENSIVE METABOLIC PANEL - Abnormal; Notable for the following:    Sodium 130 (*)    Chloride 91 (*)    Glucose, Bld 118 (*)    Total Protein 5.7 (*)    Albumin 3.2 (*)    All other components within normal limits  BRAIN NATRIURETIC PEPTIDE - Abnormal; Notable for the following:    B Natriuretic Peptide 285.3 (*)    All  other components within normal limits  LACTIC ACID, PLASMA - Abnormal; Notable for the following:    Lactic Acid, Venous 2.8 (*)    All other components within normal limits  I-STAT VENOUS BLOOD GAS, ED - Abnormal; Notable for the following:    pH, Ven 7.462 (*)    pO2, Ven 82.0 (*)    Bicarbonate 31.8 (*)    Acid-Base Excess 7.0 (*)    All other components within normal limits  CULTURE, BLOOD (ROUTINE X 2)  CULTURE, BLOOD (ROUTINE X 2)  CULTURE, EXPECTORATED SPUTUM-ASSESSMENT  GRAM STAIN  MRSA PCR SCREENING  PROCALCITONIN  URINALYSIS, ROUTINE W REFLEX MICROSCOPIC  STREP PNEUMONIAE URINARY ANTIGEN  CBC  BASIC METABOLIC PANEL  LEGIONELLA PNEUMOPHILA SEROGP 1 UR AG  TROPONIN I  TROPONIN I  TSH  LACTIC ACID, PLASMA  I-STAT TROPOININ, ED  I-STAT TROPOININ, ED    EKG  EKG Interpretation  Date/Time:  Monday June 19 2016 19:17:20 EDT Ventricular Rate:  122 PR Interval:    QRS Duration: 88 QT Interval:  330 QTC Calculation: 469 R Axis:   -74 Text Interpretation:  Sinus tachycardia Ventricular premature complex LAD, consider left anterior fascicular block Anterior infarct, old ST elevation, consider inferior injury Confirmed by DELO  MD, DOUGLAS ([id number]) on 06/19/2016 8:41:21 PM       Radiology Dg Chest Port 1 View  Result Date: 06/19/2016 CLINICAL DATA:  Patient with history of shortness of breath for 1 week. EXAM: PORTABLE CHEST 1 VIEW COMPARISON:  Chest radiograph 05/12/2016. FINDINGS: Monitoring leads overlie the patient. Normal cardiac and mediastinal contours. Aortic vascular calcifications. Heterogeneous opacities within the right mid lower lung. Left-greater-than-right apical pleuroparenchymal thickening. IMPRESSION: Heterogeneous opacities right mid and lower lung may represent pneumonia in the appropriate clinical setting. Followup PA and lateral chest X-ray is recommended in 3-4 weeks following trial of antibiotic therapy to ensure resolution and exclude underlying  malignancy. Nonspecific left-greater-than-right apical pleuroparenchymal thickening. Electronically Signed   By: Annia Belt M.D.   On: 06/19/2016 19:59    Procedures Procedures (including critical care time)  Medications Ordered in ED Medications  albuterol (PROVENTIL,VENTOLIN) solution continuous neb (0 mg/hr Nebulization Stopped 06/19/16 2152)  ceFEPIme (MAXIPIME) 2 g in dextrose 5 % 50 mL IVPB (0 g Intravenous Stopped 06/19/16 2238)  vancomycin (VANCOCIN) IVPB 750 mg/150 ml premix (not administered)  methocarbamol (ROBAXIN) tablet 500 mg (500 mg Oral Given 06/19/16  2309)  HYDROcodone-acetaminophen (NORCO) 7.5-325 MG per tablet 1 tablet (1 tablet Oral Given 06/19/16 2309)  LORazepam (ATIVAN) tablet 0.5 mg (0.5 mg Oral Given 06/19/16 2309)  escitalopram (LEXAPRO) tablet 5 mg (5 mg Oral Given 06/19/16 2308)  aspirin EC tablet 81 mg (not administered)  hydrochlorothiazide (HYDRODIURIL) tablet 25 mg (not administered)  enoxaparin (LOVENOX) injection 20 mg (not administered)  ondansetron (ZOFRAN) tablet 4 mg (not administered)    Or  ondansetron (ZOFRAN) injection 4 mg (not administered)  acetaminophen (TYLENOL) tablet 650 mg (not administered)    Or  acetaminophen (TYLENOL) suppository 650 mg (not administered)  budesonide (PULMICORT) nebulizer solution 0.5 mg (0.5 mg Nebulization Given 06/19/16 2259)  arformoterol (BROVANA) nebulizer solution 15 mcg (15 mcg Nebulization Given 06/19/16 2259)  methylPREDNISolone sodium succinate (SOLU-MEDROL) 125 mg/2 mL injection 60 mg (not administered)  guaiFENesin (MUCINEX) 12 hr tablet 600 mg (600 mg Oral Given 06/19/16 2308)  ipratropium-albuterol (DUONEB) 0.5-2.5 (3) MG/3ML nebulizer solution 3 mL (not administered)  ipratropium-albuterol (DUONEB) 0.5-2.5 (3) MG/3ML nebulizer solution 3 mL (3 mLs Nebulization Given 06/19/16 2259)  feeding supplement (ENSURE ENLIVE) (ENSURE ENLIVE) liquid 237 mL (not administered)  nicotine (NICODERM CQ - dosed in mg/24  hours) patch 21 mg (not administered)  sodium chloride 0.9 % bolus 1,000 mL (not administered)  sodium chloride 0.9 % bolus 1,000 mL (0 mLs Intravenous Stopped 06/19/16 2230)  LORazepam (ATIVAN) tablet 1 mg (1 mg Oral Given 06/19/16 2014)  acetaminophen (TYLENOL) tablet 650 mg (650 mg Oral Given 06/19/16 2014)  vancomycin (VANCOCIN) IVPB 1000 mg/200 mL premix (1,000 mg Intravenous New Bag/Given 06/19/16 2238)     Initial Impression / Assessment and Plan / ED Course  I have reviewed the triage vital signs and the nursing notes.  Pertinent labs & imaging results that were available during my care of the patient were reviewed by me and considered in my medical decision making (see chart for details).      Patient exhibits increased work of breathing on arrival, with tachypnea and accessory muscle use. Poor air movement throughout as well as diffuse wheezing. Afebrile. Patient started on an hour of continuous albuterol, with improvement in her wheezing and air movement. Venous blood gas shows pH is 7.46 and PCO2 of 44. Chest x-ray with opacities in the right mid and lower lungs that likely reflect pneumonia. BNP mildly elevated at 285. Labs with large leukocytosis to 29. EKG is not significantly changed from prior, without evidence of acute ischemia. First troponin 0.01.  Blood cultures drawn, and patient started on IV Levaquin for treatment of pneumonia. Solumedrol given prior to arrival to the ED for COPD exacerbation. Will admit to the hospitalist service for management.  Care of patient overseen by my attending, Dr. Judd LieneLo.  Final Clinical Impressions(s) / ED Diagnoses   Final diagnoses:  COPD exacerbation (HCC)  Community acquired pneumonia of right lung, unspecified part of lung    New Prescriptions Current Discharge Medication List       Jenifer Ernestina PennaBrunno Irick, MD 06/20/16 0025    Geoffery Lyonsouglas Delo, MD 06/20/16 [phone]

## 2016-06-19 NOTE — ED Triage Notes (Signed)
Pt arrived to ED via EMS from home. C/o worsening shortness of breath over the past 3 days. Hx of COPD. EMS provided solumedrol, albuterol and duoneb x1. Non productive cough present, wheezing in both lungs. Elevated respirations and heart rate.

## 2016-06-19 NOTE — H&P (Signed)
History and Physical    Denise ChrisKatherine M Puskas GNF:[id number] DOB: Feb 05, 1932 DOA: 06/19/2016  Referring MD/NP/PA: Dr. Moody BruinsIrick, Resident PCP: Hoyle SauerAVVA,RAVISANKAR R, MD  Patient coming from: Home  Chief Complaint: Shortness of breath  HPI: Denise Adams is a 83 y.o. female with medical history significant of HTN, HLD, carotid artery disease, PVD, tobacco abuse, and COPD; who presents with complaints of worsening shortness of breath over the last 1 week. At baseline patient does not require oxygen at home. She still endorses smoking approximately 1/2 to 1 pack cigarettes per day on average. Reports having a productive cough of yellow sputum initially, but after utilizing Mucinex DM patient reports cough has been dry the last 2 days. Associated symptoms include wheezing. She tried utilizing home inhalers of Symbicort and albuterol without relief of symptoms. Denies any chest pain, abdominal pain, nausea, vomiting, dysuria, or leg swelling. Review of records shows the patient was recently hospitalized 1 month ago for COPD exacerbation. EMS noted his initial O2 saturations of 86% on room air. Patient was reportedly given 125 mg of Solu-Medrol, magnesium sulfate, and 2 breathing treatments without relief of symptoms.   ED Course: Upon admission to the emergency department patient was seen to be afebrile, pulse up to 138, respirations 21-33, O2 saturations 91-100% on 2 L nasal cannula oxygen. Laboratory revealed WBC 29, hemoglobin 11.3, sodium 130, and chloride 91. CXR revealed right mid and lower sided opacities. Patient was initially ordered antibiotics of Levaquin.   Review of Systems: As per HPI otherwise 10 point review of systems negative.   Past Medical History:  Diagnosis Date  . Aortic stenosis   . COPD (chronic obstructive pulmonary disease) (HCC)   . Diabetes mellitus   . HLD (hyperlipidemia)   . HTN (hypertension)   . Myocardial infarction   . PVD (peripheral vascular disease) (HCC)      Past Surgical History:  Procedure Laterality Date  . ABDOMINAL HYSTERECTOMY    . APPENDECTOMY    . CAROTID ENDARTERECTOMY     Left  . CATARACT EXTRACTION    . FOOT SURGERY     right  . TONSILLECTOMY       reports that she has been smoking Cigarettes.  She has a 45.00 pack-year smoking history. She has never used smokeless tobacco. She reports that she does not drink alcohol or use drugs.  No Known Allergies  Family History  Problem Relation Age of Onset  . Coronary artery disease Father   . Coronary artery disease Brother [id number]  . Coronary artery disease Brother [id number]    Prior to Admission medications   Medication Sig Start Date End Date Taking? Authorizing Provider  albuterol (PROVENTIL HFA;VENTOLIN HFA) 108 (90 BASE) MCG/ACT inhaler Inhale 2 puffs into the lungs every 6 (six) hours as needed for shortness of breath.    Yes Historical Provider, MD  aspirin EC 81 MG tablet Take 81 mg by mouth daily.   Yes Historical Provider, MD  budesonide-formoterol (SYMBICORT) 160-4.5 MCG/ACT inhaler Inhale 2 puffs into the lungs 2 (two) times daily. 05/13/16  Yes Ripudeep Jenna LuoK Rai, MD  Cholecalciferol (VITAMIN D) 2000 UNITS CAPS Take 2,000 Units by mouth daily.    Yes Historical Provider, MD  cyanocobalamin 1000 MCG tablet Take 1,000 mcg by mouth daily. Reported on 09/21/2015   Yes Historical Provider, MD  escitalopram (LEXAPRO) 5 MG tablet Take 5 mg by mouth at bedtime.  06/18/16  Yes Historical Provider, MD  hydrochlorothiazide (HYDRODIURIL) 25 MG tablet Take 25 mg by mouth  daily. 05/27/16  Yes Historical Provider, MD  HYDROcodone-acetaminophen (NORCO) 7.5-325 MG per tablet Take 1 tablet by mouth every 6 (six) hours as needed for moderate pain.    Yes Historical Provider, MD  Boris Lown Oil 1000 MG CAPS Take 1,000 mg by mouth daily.   Yes Historical Provider, MD  LORazepam (ATIVAN) 0.5 MG tablet Take 0.5 mg by mouth every 8 (eight) hours. Reported on 09/21/2015   Yes Historical Provider, MD  Multiple  Vitamins-Minerals (PRESERVISION AREDS PO) Take 1 tablet by mouth 2 (two) times daily.    Yes Historical Provider, MD  methocarbamol (ROBAXIN) 500 MG tablet Take 1 tablet (500 mg total) by mouth every 8 (eight) hours as needed for muscle spasms. Patient not taking: Reported on 06/19/2016 05/13/16   Ripudeep Jenna Luo, MD    Physical Exam:    Constitutional: Frail elderly female who appears to be in moderate respiratory distress  Vitals:   06/19/16 2000 06/19/16 2030 06/19/16 2045 06/19/16 2115  BP: 139/69 136/57 108/60 (!) 120/109  Pulse: 114 119 (!) 127 (!) 134  Resp: 21 24 (!) 30 (!) 28  Temp:      TempSrc:      SpO2: 97% 96% 97% 95%   Eyes: PERRL, lids and conjunctivae normal ENMT: Mucous membranes are moist. Posterior pharynx clear of any exudate or lesions. Neck: normal, supple, no masses, no thyromegaly Respiratory: Tachypneic with expiratory wheezes appreciated and decreased overall air movement. Increased AP diameter chest wall  Cardiovascular:Tachycardic, no murmurs / rubs / gallops. No extremity edema. 2+ pedal pulses. No carotid bruits.  Abdomen: no tenderness, no masses palpated. No hepatosplenomegaly. Bowel sounds positive.  Musculoskeletal: no clubbing / cyanosis. No joint deformity upper and lower extremities. Good ROM, no contractures. Normal muscle tone.  Skin: no rashes, lesions, ulcers. No induration Neurologic: CN 2-12 grossly intact. Sensation intact, DTR normal. Strength 5/5 in all 4.  Psychiatric: Fair judgment and insight. Alert and oriented x 3. Normal mood.     Labs on Admission: I have personally reviewed following labs and imaging studies  CBC:  Recent Labs Lab 06/19/16 1920  WBC 29.0*  NEUTROABS 25.8*  HGB 11.3*  HCT 33.9*  MCV 98.0  PLT 378   Basic Metabolic Panel:  Recent Labs Lab 06/19/16 1920  NA 130*  K 4.0  CL 91*  CO2 27  GLUCOSE 118*  BUN 10  CREATININE 0.68  CALCIUM 9.0   GFR: CrCl cannot be calculated (Unknown ideal  weight.). Liver Function Tests:  Recent Labs Lab 06/19/16 1920  AST 30  ALT 19  ALKPHOS 83  BILITOT 0.9  PROT 5.7*  ALBUMIN 3.2*   No results for input(s): LIPASE, AMYLASE in the last 168 hours. No results for input(s): AMMONIA in the last 168 hours. Coagulation Profile: No results for input(s): INR, PROTIME in the last 168 hours. Cardiac Enzymes: No results for input(s): CKTOTAL, CKMB, CKMBINDEX, TROPONINI in the last 168 hours. BNP (last 3 results) No results for input(s): PROBNP in the last 8760 hours. HbA1C: No results for input(s): HGBA1C in the last 72 hours. CBG: No results for input(s): GLUCAP in the last 168 hours. Lipid Profile: No results for input(s): CHOL, HDL, LDLCALC, TRIG, CHOLHDL, LDLDIRECT in the last 72 hours. Thyroid Function Tests: No results for input(s): TSH, T4TOTAL, FREET4, T3FREE, THYROIDAB in the last 72 hours. Anemia Panel: No results for input(s): VITAMINB12, FOLATE, FERRITIN, TIBC, IRON, RETICCTPCT in the last 72 hours. Urine analysis:    Component Value Date/Time  COLORURINE YELLOW 05/11/2016 1749   APPEARANCEUR CLEAR 05/11/2016 1749   LABSPEC 1.014 05/11/2016 1749   PHURINE 8.0 05/11/2016 1749   GLUCOSEU NEGATIVE 05/11/2016 1749   HGBUR NEGATIVE 05/11/2016 1749   BILIRUBINUR NEGATIVE 05/11/2016 1749   KETONESUR NEGATIVE 05/11/2016 1749   PROTEINUR 30 (A) 05/11/2016 1749   UROBILINOGEN 0.2 05/25/2009 1224   NITRITE NEGATIVE 05/11/2016 1749   LEUKOCYTESUR NEGATIVE 05/11/2016 1749   Sepsis Labs: No results found for this or any previous visit (from the past 240 hour(s)).   Radiological Exams on Admission: Dg Chest Port 1 View  Result Date: 06/19/2016 CLINICAL DATA:  Patient with history of shortness of breath for 1 week. EXAM: PORTABLE CHEST 1 VIEW COMPARISON:  Chest radiograph 05/12/2016. FINDINGS: Monitoring leads overlie the patient. Normal cardiac and mediastinal contours. Aortic vascular calcifications. Heterogeneous opacities  within the right mid lower lung. Left-greater-than-right apical pleuroparenchymal thickening. IMPRESSION: Heterogeneous opacities right mid and lower lung may represent pneumonia in the appropriate clinical setting. Followup PA and lateral chest X-ray is recommended in 3-4 weeks following trial of antibiotic therapy to ensure resolution and exclude underlying malignancy. Nonspecific left-greater-than-right apical pleuroparenchymal thickening. Electronically Signed   By: Annia Belt M.D.   On: 06/19/2016 19:59    EKG: Independently reviewed. Sinus tachycardia with possible inferior ST elevation Assessment/Plan Sepsis 2/2 HCAP (healthcare-associated pneumonia): Acute. Patient presents with worsening shortness of breath and intermittent cough. Chest x-ray showing signs of a right sided pneumonia. Patient initially started on Levaquin. However, just last admitted one month ago for COPD exacerbation. - Admit to a stepdown - Sepsis protocol initiated - Follow-up blood and sputum cultures  - Changed empiric antibiotics of vancomycin and cefepime given recent hospitalization - mucinex  COPD exacerbation with acute respiratory failure with hypoxia: On admission patient was seen to be tachypneic with wheezing. Initial O2 sat saturations noted to be 86% on room air. Initial ABG revealed a pH of 7.462, PCO2 44.4, PO2  82. .- Continuous pulse oximetry with nasal cannula oxygen to keep O2 sats greater than 90% - IV solumedrol 60 mg IV q 8hr - Duoneb qid and Prn sob/whzing - Brovana and budesonide Nebs  Essential hypertension - Continue HCTZ  Chronic pain - Continue Norco prn   Chronic diastolic CHF and aortic stenosis: Appears currently euvolemic. BNP was noted to be 285.3. Last echocardiogram appears to have been in 2013 EF of 55-60%. - Continue to monitor  PVD and AAA - Continue aspirin - Continue outpatient surveillance of AAA  Anemia: Acute. initial hemoglobin noted to be 11.3 - Recheck CBC in  a.m.  History of hypothyroidism: Patient does not appear to be on levothyroxine anymore - check TSH in a.m. - will need to question why  no longer on levothyroxine   Anxiety - Continue Lexapro and Ativan prn    Tobacco abuse - Counseled on the need for cessation of tobacco - Nicotine patch offered    DVT prophylaxis: Lovenox   Code Status: Full  Family Communication: Discussed plan of care with the patient and family present at bedside Disposition Plan: Likely discharge home once medically stable Consults called: None  Admission status: Inpatient  Clydie Braun MD [facility] Pager [phone]  If 7PM-7AM, please contact night-coverage [email or web address] Password [facility]  06/19/2016, 10:11 PM

## 2016-06-20 DIAGNOSIS — Z720 Tobacco use: Secondary | ICD-10-CM

## 2016-06-20 DIAGNOSIS — F411 Generalized anxiety disorder: Secondary | ICD-10-CM | POA: Diagnosis present

## 2016-06-20 DIAGNOSIS — A419 Sepsis, unspecified organism: Principal | ICD-10-CM

## 2016-06-20 DIAGNOSIS — J441 Chronic obstructive pulmonary disease with (acute) exacerbation: Secondary | ICD-10-CM | POA: Diagnosis present

## 2016-06-20 DIAGNOSIS — E43 Unspecified severe protein-calorie malnutrition: Secondary | ICD-10-CM | POA: Insufficient documentation

## 2016-06-20 DIAGNOSIS — J181 Lobar pneumonia, unspecified organism: Secondary | ICD-10-CM

## 2016-06-20 LAB — URINALYSIS, ROUTINE W REFLEX MICROSCOPIC
Bacteria, UA: NONE SEEN
Bilirubin Urine: NEGATIVE
Glucose, UA: >=500 mg/dL — AB
Hgb urine dipstick: NEGATIVE
KETONES UR: 5 mg/dL — AB
Leukocytes, UA: NEGATIVE
Nitrite: NEGATIVE
PROTEIN: NEGATIVE mg/dL
Specific Gravity, Urine: 1.013 (ref 1.005–1.030)
pH: 5 (ref 5.0–8.0)

## 2016-06-20 LAB — BASIC METABOLIC PANEL
Anion gap: 8 (ref 5–15)
BUN: 7 mg/dL (ref 6–20)
CHLORIDE: 97 mmol/L — AB (ref 101–111)
CO2: 24 mmol/L (ref 22–32)
CREATININE: 0.57 mg/dL (ref 0.44–1.00)
Calcium: 7.7 mg/dL — ABNORMAL LOW (ref 8.9–10.3)
Glucose, Bld: 197 mg/dL — ABNORMAL HIGH (ref 65–99)
POTASSIUM: 3.4 mmol/L — AB (ref 3.5–5.1)
Sodium: 129 mmol/L — ABNORMAL LOW (ref 135–145)

## 2016-06-20 LAB — CBC
HCT: 26.2 % — ABNORMAL LOW (ref 36.0–46.0)
HEMOGLOBIN: 8.8 g/dL — AB (ref 12.0–15.0)
MCH: 32.8 pg (ref 26.0–34.0)
MCHC: 33.6 g/dL (ref 30.0–36.0)
MCV: 97.8 fL (ref 78.0–100.0)
PLATELETS: 301 10*3/uL (ref 150–400)
RBC: 2.68 MIL/uL — AB (ref 3.87–5.11)
RDW: 13.9 % (ref 11.5–15.5)
WBC: 29.2 10*3/uL — ABNORMAL HIGH (ref 4.0–10.5)

## 2016-06-20 LAB — TROPONIN I
TROPONIN I: 0.03 ng/mL — AB (ref <0.03)
Troponin I: 0.03 ng/mL (ref <0.03)

## 2016-06-20 LAB — HEMOGLOBIN AND HEMATOCRIT, BLOOD
HCT: 28.1 % — ABNORMAL LOW (ref 36.0–46.0)
HEMOGLOBIN: 9.3 g/dL — AB (ref 12.0–15.0)

## 2016-06-20 LAB — STREP PNEUMONIAE URINARY ANTIGEN: STREP PNEUMO URINARY ANTIGEN: NEGATIVE

## 2016-06-20 LAB — TYPE AND SCREEN
ABO/RH(D): B POS
Antibody Screen: NEGATIVE

## 2016-06-20 LAB — TSH: TSH: 1.374 u[IU]/mL (ref 0.350–4.500)

## 2016-06-20 LAB — LACTIC ACID, PLASMA: LACTIC ACID, VENOUS: 1.1 mmol/L (ref 0.5–1.9)

## 2016-06-20 LAB — MRSA PCR SCREENING: MRSA by PCR: NEGATIVE

## 2016-06-20 MED ORDER — HYDROCODONE-ACETAMINOPHEN 10-325 MG PO TABS: 2.0000 | ORAL_TABLET | Freq: Four times a day (QID) | ORAL | Status: DC | PRN

## 2016-06-20 MED ORDER — HYDROCODONE-ACETAMINOPHEN 7.5-325 MG PO TABS: 2.0000 | ORAL_TABLET | Freq: Four times a day (QID) | ORAL | Status: DC | PRN

## 2016-06-20 MED ORDER — ROPINIROLE HCL 1 MG PO TABS
0.5000 mg | ORAL_TABLET | Freq: Every day | ORAL | Status: DC
  Administered 2016-06-20 – 2016-06-25 (×6): 0.5 mg via ORAL
  Filled 2016-06-20 (×7): qty 1

## 2016-06-20 MED ORDER — METHYLPREDNISOLONE SODIUM SUCC 125 MG IJ SOLR
60.0000 mg | Freq: Every day | INTRAMUSCULAR | Status: DC
  Administered 2016-06-21: 60 mg via INTRAVENOUS
  Filled 2016-06-20: qty 2

## 2016-06-20 MED ORDER — HYDROCODONE-ACETAMINOPHEN 7.5-325 MG PO TABS
2.0000 | ORAL_TABLET | Freq: Four times a day (QID) | ORAL | Status: DC | PRN
  Administered 2016-06-21 – 2016-06-26 (×12): 2 via ORAL
  Filled 2016-06-20 (×13): qty 2

## 2016-06-20 MED ORDER — ORAL CARE MOUTH RINSE
15.0000 mL | Freq: Two times a day (BID) | OROMUCOSAL | Status: DC
  Administered 2016-06-20 – 2016-06-26 (×11): 15 mL via OROMUCOSAL

## 2016-06-20 MED ORDER — SODIUM CHLORIDE 0.9 % IV BOLUS (SEPSIS): 1000.0000 mL | Freq: Once | INTRAVENOUS | Status: DC

## 2016-06-20 MED ORDER — POTASSIUM CHLORIDE CRYS ER 20 MEQ PO TBCR
40.0000 meq | EXTENDED_RELEASE_TABLET | Freq: Once | ORAL | Status: AC
  Administered 2016-06-20: 40 meq via ORAL
  Filled 2016-06-20: qty 2

## 2016-06-20 MED ORDER — SODIUM CHLORIDE 0.9 % IV BOLUS (SEPSIS)
1000.0000 mL | Freq: Once | INTRAVENOUS | Status: AC
  Administered 2016-06-20: 1000 mL via INTRAVENOUS

## 2016-06-20 NOTE — Progress Notes (Signed)
CRITICAL VALUE ALERT  Critical value received:  Lactic Acid 2.8  Date of notification:  06/19/2016  Time of notification:  2300  Critical value read back:Yes.    Nurse who received alert:  Kristeen Misshris Manroop Jakubowicz RN  MD notified (1st page):  Donnamarie PoagK. Kirby NP  Time of first page:  0000  MD notified (2nd page):  Time of second page:  Responding MD:  Donnamarie PoagK. Kirby NP  Time MD responded:  [phone]

## 2016-06-20 NOTE — Progress Notes (Signed)
Initial Nutrition Assessment  DOCUMENTATION CODES:   Severe malnutrition in context of chronic illness, Underweight  INTERVENTION:    Continue Ensure Enlive po BID, each supplement provides 350 kcal and 20 grams of protein  NUTRITION DIAGNOSIS:   Malnutrition related to chronic illness as evidenced by severe depletion of muscle mass, severe depletion of body fat  GOAL:   Patient will meet greater than or equal to 90% of their needs  MONITOR:   PO intake, Supplement acceptance, Labs, Weight trends, I & O's  REASON FOR ASSESSMENT:   Malnutrition Screening Tool  ASSESSMENT:   83 y.o. Female with medical history significant for hypertension, depression, tobacco abuse, COPD, peripheral vascular disease. [facility]ness [facility] breath and associated cough productive of yellowish sputum for past 1 week prior to this admission.  RD unable to obtain nutrition hx from pt. PO intake 0% per flowsheet records. Nutrients needs increased given COPD. Labs reviewed.  Sodium 129 (L).  Potassium 3.4 (L). Medications reviewed.  Ensure Enlive supplement ordered BID.  Nutrition-Focused physical exam completed. Findings are severe fat depletion, severe muscle depletion, and no edema.   Diet Order:  Diet Heart Room service appropriate? Yes; Fluid consistency: Thin  Skin:  Reviewed, no issues  Last BM:  3/13  Height:   Ht Readings from Last 1 Encounters:  06/19/16 5\' 3"  (1.6 m)    Weight:   Wt Readings from Last 1 Encounters:  06/19/16 93 lb 14.7 oz (42.6 kg)    Ideal Body Weight:  52.2 kg  BMI:  Body mass index is 16.64 kg/m.  Estimated Nutritional Needs:   Kcal:  1100-1300  Protein:  55-65 gm  Fluid:  >/= 1.5 L  EDUCATION NEEDS:   No education needs identified at this time  Maureen ChattersKatie Julie-Anne Torain, RD, LDN Pager #: [phone] After-Hours Pager #: [phone]

## 2016-06-20 NOTE — Plan of Care (Signed)
Problem: Pain Managment: Goal: General experience of comfort will improve Outcome: Not Progressing Pt has continued pain related to her restless leg syndrome. Pain not relieved following intervention with robaxin, hydrocodone, fluid bolus, ativan, and implementation of scd's. See MAR for exact times and dosages.

## 2016-06-20 NOTE — Care Management Note (Addendum)
Case Management Note  Patient Details  Name: Denise Adams MRN: [id number] Date of Birth: 02/14/1932  Subjective/Objective:                 Spoke to patient at the bedside. Patient lives at home with daughter who has physical disability. Patient describes herself as fairly independent up to time close to admission. Patienyt states she recently stopped driving, relying on family and friends to assist with transportation needs. Patient states they do not receive assistance at home, except for some cleaning once a month. Patient does not have home oxygen or DME to assist with ambulation. Patient does have home nebulizer that is in working order. [place] [facility] [place] [place]. Anticipate HH needs and possibly home O2. Sticky note placed requesting desat test and PT eval.  PCP [facility]    Addendum 06/23/16 [name] Spoke with patient and son [name] at bedside. State that patient will DC to daughter [name]'s house so she can have 24 hour supervision by [name] and [name]. Anticipate HH RN PT OT HHA.  Address [address] [id number] [place] [id number] Eunice BlaseDebbie [phone] [name] [phone] Rexene EdisonH, [phone] C   Action/Plan:  CM will continue to follow for DC needs. Patient likely to need RW and BSC. Family states they may be interested in hospital bed but want to see how much she improves by DC (?Monday per family at bedside) prior to ordering DME. [name] with tentative [facility] projected Monday.    Expected Discharge Date:                  Expected Discharge Plan:  Home w Home Health Services  In-House Referral:     Discharge planning Services  CM Consult  Post Acute Care Choice:    Choice offered to:     DME Arranged:    DME Agency:     HH Arranged:    HH Agency:     Status of Service:  In process, will continue to follow  If discussed at Long Length of Stay Meetings, dates discussed:    Additional Comments:  Lawerance SabalDebbie Cyan [place],  RN 06/20/2016, 2:48 PM

## 2016-06-20 NOTE — Progress Notes (Signed)
CRITICAL VALUE ALERT  Critical value received:  Troponin 0.03  Date of notification:  06/20/2016  Time of notification:  0140  Critical value read back:Yes.    Nurse who received alert:  Kristeen Misshris Fumio Vandam RN  MD notified (1st page):  Donnamarie PoagK. Kirby NP  Time of first page:  0143  MD notified (2nd page):  Time of second page:  Responding MD:  Donnamarie PoagK. Kirby NP  Time MD responded:  [phone]

## 2016-06-20 NOTE — Progress Notes (Addendum)
Patient ID: Denise Adams, female   DOB: 1932-02-18, 83 y.o.   MRN: [id number]  PROGRESS NOTE    GWENETH FREDLUND  MVH:[id number] DOB: 1931/04/18 DOA: 06/19/2016  PCP: Tivis Ringer, MD   Brief Narrative:  83 y.o. female with medical history significant for hypertension, depression, tobacco abuse, COPD, peripheral vascular disease. Patient presented to [facility] with worsening shortness of breath and associated cough productive of yellowish sputum for past 1 week prior to this admission. She smokes about 1-1/2 to one pack of cigarettes per day on average. Patient tried Mucinex over-the-counter which has caused her to have a dry cough for past 2 days. She also reported associated wheezing. She uses home inhalers including Symbicort and albuterol without significant symptomatic relief. No chest pain. No fevers. In ED, patient was afebrile, heart rate up to 138, respirations 2133, oxygen saturation 91% on 2 L nasal cannula oxygen support. Blood work demonstrated white blood cell count of 29, hemoglobin 11.3, sodium 1:30. Chest x-ray showed a right mid and lower lung opacities. Patient was given Levaquin in the ED and then was placed on vancomycin and cefepime while awaiting culture results.   Assessment & Plan:   Principal Problem: Sepsis secondary to lobar pneumonia / leukocytosis / lactic acidosis - Sepsis criteria met on the admission with tachycardia, tachypnea, hypoxia, hypotension, leukocytosis and lactic acidosis - Source of infection likely pneumonia - Chest x-ray on the admission showed opacities in the right mid and lower lung lobe - Patient currently on vancomycin and cefepime. No evidence of MRSA infection so we will stop vancomycin and continue Cefotan - Follow up final blood culture results - Repeat lactic acid in the morning  Active Problems: Troponin elevation - Demand ischemia from sepsis and pneumonia - No reports of chest pain - Troponin level flat, 0.032 -  Last 2-D echo in 2013 showed ejection fraction 55% and grade 1 diastolic dysfunction  Acute COPD exacerbation / acute respiratory failure with hypoxia - Continue oxygen support via nasal cannula to keep oxygen saturation above 90% - Patient still has wheezing on physical exam - Continue duoneb every 4 hours scheduled and every 2 hours as needed for shortness of breath - Continue Pulmicort and Brovana nebulizer twice daily - Continue Solu-Medrol however decreased the frequency from every 8 hours to once a day regimen  Normocytic anemia - Hemoglobin on the admission 11.3 and repeat hemoglobin 8.8 - This morning hemoglobin 9.3 - No evidence of bleeding - Follow-up CBC in the morning  Hyponatremia - Likely in the setting of acute infectious process, pneumonia, sepsis, possibly dehydration versus HCTZ - We'll continue to monitor sodium level - Stop HCTZ today  Hypokalemia - Due to hctz - Supplemented   Restless leg syndrome - Start Requip  Depression and anxiety - Continue Ativan and Lexapro  Tobacco abuse - Counseled on smoking cessation - Continue nicotine patch  Severe protein calorie malnutrition - In the context of acute illness - Seen by dietician     DVT prophylaxis: SCDs and aspirin  Code Status: full code  Family Communication: daughter at bedside Disposition Plan: Will need PT evaluation for safe discharge plan, anticipate discharge by 06/22/2016   Consultants:   PT  Procedures:   None   Antimicrobials:   Vanco 06/19/2016 --> 06/20/2016  Cefepime 06/19/2016 -->   Subjective: Short of breath with exertion this am.   Objective: Vitals:   06/20/16 0820 06/20/16 0917 06/20/16 0919 06/20/16 0922  BP:      Pulse: Marland Kitchen)  105     Resp: (!) 27     Temp:      TempSrc:      SpO2: 92% 97% 97% 95%  Weight:      Height:        Intake/Output Summary (Last 24 hours) at 06/20/16 0950 Last data filed at 06/20/16 0855  Gross per 24 hour  Intake              1170 ml  Output              450 ml  Net              720 ml   Filed Weights   06/19/16 2215 06/19/16 2300  Weight: 42.1 kg (92 lb 13 oz) 42.6 kg (93 lb 14.7 oz)    Examination:  General exam: Appears calm, no acute distress  Respiratory system: rhonchoros breath sounds, wheezing in upper lung lobes  Cardiovascular system: S1 & S2 heard, Rate controlled  Gastrointestinal system: Abdomen is nondistended, soft and nontender. No organomegaly or masses felt. Normal bowel sounds heard. Central nervous system: Alert and oriented. No focal neurological deficits. Extremities: Symmetric 5 x 5 power. Skin: No rashes, lesions or ulcers Psychiatry: Judgement and insight appear normal. Mood & affect appropriate.   Data Reviewed: I have personally reviewed following labs and imaging studies  CBC:  Recent Labs Lab 06/19/16 1920 06/20/16 0248 06/20/16 0757  WBC 29.0* 29.2*  --   NEUTROABS 25.8*  --   --   HGB 11.3* 8.8* 9.3*  HCT 33.9* 26.2* 28.1*  MCV 98.0 97.8  --   PLT 378 301  --    Basic Metabolic Panel:  Recent Labs Lab 06/19/16 1920 06/20/16 0248  NA 130* 129*  K 4.0 3.4*  CL 91* 97*  CO2 27 24  GLUCOSE 118* 197*  BUN 10 7  CREATININE 0.68 0.57  CALCIUM 9.0 7.7*   GFR: Estimated Creatinine Clearance: 36.5 mL/min (by C-G formula based on SCr of 0.57 mg/dL). Liver Function Tests:  Recent Labs Lab 06/19/16 1920  AST 30  ALT 19  ALKPHOS 83  BILITOT 0.9  PROT 5.7*  ALBUMIN 3.2*   No results for input(s): LIPASE, AMYLASE in the last 168 hours. No results for input(s): AMMONIA in the last 168 hours. Coagulation Profile: No results for input(s): INR, PROTIME in the last 168 hours. Cardiac Enzymes:  Recent Labs Lab 06/20/16 0037 06/20/16 0248  TROPONINI 0.03* 0.03*   BNP (last 3 results) No results for input(s): PROBNP in the last 8760 hours. HbA1C: No results for input(s): HGBA1C in the last 72 hours. CBG: No results for input(s): GLUCAP in the last  168 hours. Lipid Profile: No results for input(s): CHOL, HDL, LDLCALC, TRIG, CHOLHDL, LDLDIRECT in the last 72 hours. Thyroid Function Tests:  Recent Labs  06/20/16 0248  TSH 1.374   Anemia Panel: No results for input(s): VITAMINB12, FOLATE, FERRITIN, TIBC, IRON, RETICCTPCT in the last 72 hours. Urine analysis:    Component Value Date/Time   COLORURINE YELLOW 06/20/2016 0005   APPEARANCEUR CLEAR 06/20/2016 0005   LABSPEC 1.013 06/20/2016 0005   PHURINE 5.0 06/20/2016 0005   GLUCOSEU >=500 (A) 06/20/2016 0005   HGBUR NEGATIVE 06/20/2016 0005   BILIRUBINUR NEGATIVE 06/20/2016 0005   KETONESUR 5 (A) 06/20/2016 0005   PROTEINUR NEGATIVE 06/20/2016 0005   UROBILINOGEN 0.2 05/25/2009 1224   NITRITE NEGATIVE 06/20/2016 0005   LEUKOCYTESUR NEGATIVE 06/20/2016 0005   Sepsis Labs: '@LABRCNTIP' (procalcitonin:4,lacticidven:4)  MRSA PCR Screening     Status: None   Collection Time: 06/19/16 11:00 PM  Result Value Ref Range Status   MRSA by PCR NEGATIVE NEGATIVE Final      Radiology Studies: Dg Chest Port 1 View Result Date: 06/19/2016 Heterogeneous opacities right mid and lower lung may represent pneumonia in the appropriate clinical setting. Followup PA and lateral chest X-ray is recommended in 3-4 weeks following trial of antibiotic therapy to ensure resolution and exclude underlying malignancy. Nonspecific left-greater-than-right apical pleuroparenchymal thickening.   Scheduled Meds: . arformoterol  15 mcg Nebulization BID  . aspirin EC  81 mg Oral Daily  . budesonide   0.5 mg Nebulization BID  . ceFEPime   2 g Intravenous Q24H  . escitalopram  5 mg Oral QHS  . feeding supplement   237 mL Oral BID BM  . guaiFENesin  600 mg Oral BID  . hydrochlorothiazide  25 mg Oral Daily  . ipratropium-albutero  3 mL Nebulization QID  . LORazepam  0.5 mg Oral Q8H  . methylPREDNISol  60 mg Intravenous Q8H  . rOPINIRole  0.5 mg Oral QHS   Continuous Infusions:   LOS: 1 day    Time  spent: 25 minutes  Greater than 50% of the time spent on counseling and coordinating the care.   Leisa Lenz, MD [facility] Pager [phone]  If 7PM-7AM, please contact night-coverage [email or web address] Password [email or web address] 06/20/2016, 9:50 AM

## 2016-06-21 DIAGNOSIS — J9601 Acute respiratory failure with hypoxia: Secondary | ICD-10-CM

## 2016-06-21 LAB — BASIC METABOLIC PANEL
Anion gap: 7 (ref 5–15)
BUN: 8 mg/dL (ref 6–20)
CO2: 28 mmol/L (ref 22–32)
CREATININE: 0.5 mg/dL (ref 0.44–1.00)
Calcium: 8.8 mg/dL — ABNORMAL LOW (ref 8.9–10.3)
Chloride: 96 mmol/L — ABNORMAL LOW (ref 101–111)
GFR calc Af Amer: >60 mL/min (ref >60)
Glucose, Bld: 94 mg/dL (ref 65–99)
Potassium: 4.7 mmol/L (ref 3.5–5.1)
SODIUM: 131 mmol/L — AB (ref 135–145)

## 2016-06-21 LAB — CBC
HCT: 27.5 % — ABNORMAL LOW (ref 36.0–46.0)
Hemoglobin: 9 g/dL — ABNORMAL LOW (ref 12.0–15.0)
MCH: 31.6 pg (ref 26.0–34.0)
MCHC: 32.7 g/dL (ref 30.0–36.0)
MCV: 96.5 fL (ref 78.0–100.0)
PLATELETS: 309 10*3/uL (ref 150–400)
RBC: 2.85 MIL/uL — AB (ref 3.87–5.11)
RDW: 13.7 % (ref 11.5–15.5)
WBC: 29.4 10*3/uL — ABNORMAL HIGH (ref 4.0–10.5)

## 2016-06-21 LAB — LEGIONELLA PNEUMOPHILA SEROGP 1 UR AG: L. pneumophila Serogp 1 Ur Ag: NEGATIVE

## 2016-06-21 LAB — LACTIC ACID, PLASMA: LACTIC ACID, VENOUS: 0.6 mmol/L (ref 0.5–1.9)

## 2016-06-21 MED ORDER — METHYLPREDNISOLONE SODIUM SUCC 40 MG IJ SOLR
40.0000 mg | Freq: Every day | INTRAMUSCULAR | Status: DC
  Administered 2016-06-22 – 2016-06-24 (×3): 40 mg via INTRAVENOUS
  Filled 2016-06-21 (×3): qty 1

## 2016-06-21 NOTE — Evaluation (Signed)
Physical Therapy Evaluation Patient Details Name: Denise Adams MRN: [id number] DOB: 1931-09-02 Today's Date: 06/21/2016   History of Present Illness  83 y.o. female with medical history significant for hypertension, depression, tobacco abuse, COPD, peripheral vascular disease. Patient presented to [facility] with worsening shortness of breath and associated cough productive of yellowish sputum for past 1 week prior to this admission.   Clinical Impression  Pt admitted with above diagnosis. Pt currently with functional limitations due to the deficits listed below (see PT Problem List). Pt has difficulty speaking as well as performing any mobility due to DOE. O2 sats dropped to 85% on RA. Ambulation distance very limited by DOE as well as discomfort BLE's.  Pt will benefit from skilled PT to increase their independence and safety with mobility to allow discharge to the venue listed below.       Follow Up Recommendations Home health PT    Equipment Recommendations  Other (comment) (rollator with seat)    Recommendations for Other Services       Precautions / Restrictions Precautions Precautions: Other (comment) Precaution Comments: watch O2 sats Restrictions Weight Bearing Restrictions: No      Mobility  Bed Mobility Overal bed mobility: Needs Assistance Bed Mobility: Supine to Sit;Sit to Supine     Supine to sit: Supervision Sit to supine: Supervision   General bed mobility comments: increased time needed but pt able to get to EOB  Transfers Overall transfer level: Needs assistance Equipment used: 4-wheeled walker Transfers: Sit to/from Stand Sit to Stand: Supervision         General transfer comment: supervision for safety, pt unsteady, reported feeling weak in legs  Ambulation/Gait Ambulation/Gait assistance: Min assist Ambulation Distance (Feet): 60 Feet Assistive device: 4-wheeled walker Gait Pattern/deviations: Step-through pattern;Decreased stride  length;Trunk flexed Gait velocity: decreased Gait velocity interpretation: <1.8 ft/sec, indicative of risk for recurrent falls General Gait Details: pt ambulated with trunk flexed, used daughter's rollator at her request. Began c/o bilateral LE pain after 30' and turned back to room.   Stairs            Wheelchair Mobility    Modified Rankin (Stroke Patients Only)       Balance Overall balance assessment: Needs assistance Sitting-balance support: Single extremity supported Sitting balance-Leahy Scale: Fair     Standing balance support: Single extremity supported Standing balance-Leahy Scale: Poor Standing balance comment: unsteady in standing, needed UE support                             Pertinent Vitals/Pain Pain Assessment: Faces Faces Pain Scale: Hurts little more Pain Location: bilateral LE's with ambulation Pain Descriptors / Indicators: Aching Pain Intervention(s): Limited activity within patient's tolerance;Monitored during session    Home Living Family/patient expects to be discharged to:: Private residence Living Arrangements: Children Available Help at Discharge: Family;Available 24 hours/day Type of Home: House Home Access: Stairs to enter Entrance Stairs-Rails: Right Entrance Stairs-Number of Steps: 3 Home Layout: One level Home Equipment: None Additional Comments: pt's daughter, who has disabilities (CP maybe?), lives with her    Prior Function Level of Independence: Independent         Comments: heavy smoker     Hand Dominance        Extremity/Trunk Assessment   Upper Extremity Assessment Upper Extremity Assessment: Generalized weakness    Lower Extremity Assessment Lower Extremity Assessment: Generalized weakness    Cervical / Trunk Assessment Cervical / Trunk  Assessment: Kyphotic  Communication   Communication: Other (comment) (limited by SOB)  Cognition Arousal/Alertness: Awake/alert Behavior During Therapy:  WFL for tasks assessed/performed Overall Cognitive Status: Within Functional Limits for tasks assessed                      General Comments General comments (skin integrity, edema, etc.): O2 sats on RA 85%, 86% on 2L, 96% on 5L    Exercises     Assessment/Plan    PT Assessment Patient needs continued PT services  PT Problem List Decreased strength;Decreased activity tolerance;Decreased balance;Decreased mobility;Cardiopulmonary status limiting activity;Decreased knowledge of use of DME;Decreased knowledge of precautions;Pain       PT Treatment Interventions DME instruction;Gait training;Stair training;Functional mobility training;Therapeutic activities;Therapeutic exercise;Balance training;Patient/family education    PT Goals (Current goals can be found in the Care Plan section)  Acute Rehab PT Goals Patient Stated Goal: return home PT Goal Formulation: With patient Time For Goal Achievement: 07/05/16 Potential to Achieve Goals: Fair    Frequency Min 3X/week   Barriers to discharge Decreased caregiver support pt plans to d/c to other daughter's home    Co-evaluation               End of Session Equipment Utilized During Treatment: Gait belt;Oxygen Activity Tolerance: Patient tolerated treatment well Patient left: in bed;with call bell/phone within reach;with family/visitor present Nurse Communication: Mobility status PT Visit Diagnosis: Unsteadiness on feet (R26.81);Muscle weakness (generalized) (M62.81);Pain Pain - Right/Left:  (bilateral) Pain - part of body: Leg         Time: [id number] PT Time Calculation (min) (ACUTE ONLY): 17 min   Charges:   PT Evaluation $PT Eval Moderate Complexity: 1 Procedure     PT G Codes:       Lyanne CoVictoria Allexa Acoff, PT  Acute Rehab Services  [phone]   [place]Victoria L Terrion Poblano 06/21/2016, 2:21 PM

## 2016-06-21 NOTE — Progress Notes (Signed)
Oxygen Saturations  SATURATION QUALIFICATIONS: (This note is used to comply with regulatory documentation for home oxygen)  Patient Saturations on Room Air at Rest = 87%  Patient Saturations on Room Air while Ambulating = 85%  Patient Saturations on 2 Liters of oxygen while Ambulating = 87%, 5L of oxygen while ambulating = 92%  Please briefly explain why patient needs home oxygen: Pt with O2 desat at rest and with mobility when off O2.   Lyanne CoVictoria Omarri Eich, PT  Acute Rehab Services  [phone]

## 2016-06-21 NOTE — Progress Notes (Signed)
Pharmacy Antibiotic Note  Denise Adams is a 83 y.o. female admitted on 06/19/2016 with pneumonia.  Pharmacy has been consulted for cefepime dosing.  Day #3 of abx for PNA. CXR on 3/12 shows right mid and lower lung opacities. Afebrile, WBC remains elevated at 29.4  Plan: Continue cefepime 2g IV Q24h Monitor clinical picture, renal function, VT prn F/U C&S, abx deescalation / LOT  Height: 5\' 3"  (160 cm) Weight: 97 lb 3.6 oz (44.1 kg) IBW/kg (Calculated) : 52.4  Temp (24hrs), Avg:98.9 F (37.2 C), Min:98.3 F (36.8 C), Max:99.6 F (37.6 C)   Recent Labs Lab 06/19/16 1920 06/19/16 2145 06/20/16 0248 06/21/16 0255  WBC 29.0*  --  29.2* 29.4*  CREATININE 0.68  --  0.57 0.50  LATICACIDVEN  --  2.8* 1.1 0.6    Estimated Creatinine Clearance: 37.7 mL/min (by C-G formula based on SCr of 0.5 mg/dL).    No Known Allergies  Antimicrobials this admission:  Cefepime 3/12 >>  Vancomycin 3/12 >> 3/13  Dose adjustments this admission:  N/a  Microbiology results:  3/12 BCx: ngtd 3/12 Sputum: sent  3/12 MRSA PCR: negative  Enzo BiNathan Avila Albritton, PharmD, [facility] CenterBCPS Clinical Pharmacist Pager [phone] 06/21/2016 11:12 AM

## 2016-06-21 NOTE — Progress Notes (Signed)
Patient ID: Denise Adams, female   DOB: 09-Feb-1932, 83 y.o.   MRN: [id number]  PROGRESS NOTE    Denise Adams  DDU:[id number] DOB: [birth year] DOA: 06/19/2016  PCP: Tivis Ringer, MD   Brief Narrative:  83 y.o. female with medical history significant for hypertension, depression, tobacco abuse, COPD, peripheral vascular disease. Patient presented to [facility] with worsening shortness of breath and associated cough productive of yellowish sputum for past 1 week prior to this admission. She smokes about 1-1/2 to one pack of cigarettes per day on average. Patient tried Mucinex over-the-counter which has caused her to have a dry cough for past 2 days. She also reported associated wheezing. She uses home inhalers including Symbicort and albuterol without significant symptomatic relief. No chest pain. No fevers. In ED, patient was afebrile, heart rate up to 138, respirations 2133, oxygen saturation 91% on 2 L nasal cannula oxygen support. Blood work demonstrated white blood cell count of 29, hemoglobin 11.3, sodium 1:30. Chest x-ray showed a right mid and lower lung opacities. Patient was given Levaquin in the ED and then was placed on vancomycin and cefepime while awaiting culture results.  Pt is still short of breath at rest and with exertion, rhonchorous on physical exam and not yet at her baseline so she is not stable for discharge at this time.   Assessment & Plan:   Principal Problem: Sepsis secondary to lobar pneumonia / leukocytosis / lactic acidosis - Sepsis criteria met on the admission with tachycardia, tachypnea, hypoxia, hypotension, leukocytosis and lactic acidosis - Source of infection likely pneumonia - Chest x-ray on the admission showed opacities in the right mid and lower lung lobe - Stopped vanco 3/13 - Pt continues to be on cefepime - Blood cx so far show no growth  - Lactic acid WNL  Active Problems: Troponin elevation - Demand ischemia from sepsis and  pneumonia - No reports of chest pain - Troponin level flat, 0.032 - Last 2-D echo in 2013 showed ejection fraction 55% and grade 1 diastolic dysfunction  Acute COPD exacerbation / acute respiratory failure with hypoxia - Continue oxygen support via nasal cannula to keep oxygen saturation above 90% - Continue duoneb every 4 hours scheduled and every 2 hours as needed for shortness of breath - Continue Pulmicort and Brovana nebulizer twice daily - Continue Solu-Medrol but decrease from 60 mg IV to 40 mg IV once a day regimen   Normocytic anemia - Hemoglobin on the admission 11.3 and repeat hemoglobin 8.8 - Hgb is 9 this am, stable   Hyponatremia - Likely in the setting of acute infectious process, pneumonia, sepsis, possibly dehydration versus HCTZ - Stopped hctz on 3/13 - Sodium 131 this am   Hypokalemia - Due to hctz - Supplemented and WNL   Restless leg syndrome - Continue Requip  Depression and anxiety - Continue Ativan and Lexapro - Stable mental status   Tobacco abuse - Counseled on smoking cessation - Continue nicotine patch  Severe protein calorie malnutrition / Underweight  - In the context of acute illness - Seen by dietician     DVT prophylaxis: SCDs and aspirin  Code Status: full code  Family Communication: family not at the bedside this am  Disposition Plan: not yet stable for discharge due to shortness of breath    Consultants:   PT  Nutrition   Procedures:   None   Antimicrobials:   Vanco 06/19/2016 --> 06/20/2016  Cefepime 06/19/2016 -->   Subjective: Still short of breath,  now receiving nebulizer treatment.    Objective: Vitals:   06/20/16 2330 06/21/16 0400 06/21/16 0700 06/21/16 0823  BP: 112/60 (!) 116/50    Pulse: 85 74    Resp: 15 16    Temp: 99.6 F (37.6 C) 99.1 F (37.3 C) 99.2 F (37.3 C)   TempSrc: Oral Oral Axillary   SpO2: 100% 100%  95%  Weight:  44.1 kg (97 lb 3.6 oz)    Height:        Intake/Output Summary  (Last 24 hours) at 06/21/16 0835 Last data filed at 06/21/16 0159  Gross per 24 hour  Intake              410 ml  Output              875 ml  Net             -465 ml   Filed Weights   06/19/16 2215 06/19/16 2300 06/21/16 0400  Weight: 42.1 kg (92 lb 13 oz) 42.6 kg (93 lb 14.7 oz) 44.1 kg (97 lb 3.6 oz)    Examination:  General exam: mild distress due to shortness of breath  Respiratory system: rhonchoros breath sounds, wheezing in upper lung lobes  Cardiovascular system: S1 & S2 heard, RRR Gastrointestinal system: (+) BS, non tender abdomen  Central nervous system: No focal neurological deficits. Extremities: No swelling, palpable pulses  Skin: warm, dry  Psychiatry: Normal mood and behavior    Data Reviewed: I have personally reviewed following labs and imaging studies  CBC:  Recent Labs Lab 06/19/16 1920 06/20/16 0248 06/20/16 0757 06/21/16 0255  WBC 29.0* 29.2*  --  29.4*  NEUTROABS 25.8*  --   --   --   HGB 11.3* 8.8* 9.3* 9.0*  HCT 33.9* 26.2* 28.1* 27.5*  MCV 98.0 97.8  --  96.5  PLT 378 301  --  401   Basic Metabolic Panel:  Recent Labs Lab 06/19/16 1920 06/20/16 0248 06/21/16 0255  NA 130* 129* 131*  K 4.0 3.4* 4.7  CL 91* 97* 96*  CO2 '27 24 28  ' GLUCOSE 118* 197* 94  BUN '10 7 8  ' CREATININE 0.68 0.57 0.50  CALCIUM 9.0 7.7* 8.8*   GFR: Estimated Creatinine Clearance: 37.7 mL/min (by C-G formula based on SCr of 0.5 mg/dL). Liver Function Tests:  Recent Labs Lab 06/19/16 1920  AST 30  ALT 19  ALKPHOS 83  BILITOT 0.9  PROT 5.7*  ALBUMIN 3.2*   No results for input(s): LIPASE, AMYLASE in the last 168 hours. No results for input(s): AMMONIA in the last 168 hours. Coagulation Profile: No results for input(s): INR, PROTIME in the last 168 hours. Cardiac Enzymes:  Recent Labs Lab 06/20/16 0037 06/20/16 0248  TROPONINI 0.03* 0.03*   BNP (last 3 results) No results for input(s): PROBNP in the last 8760 hours. HbA1C: No results for  input(s): HGBA1C in the last 72 hours. CBG: No results for input(s): GLUCAP in the last 168 hours. Lipid Profile: No results for input(s): CHOL, HDL, LDLCALC, TRIG, CHOLHDL, LDLDIRECT in the last 72 hours. Thyroid Function Tests:  Recent Labs  06/20/16 0248  TSH 1.374   Anemia Panel: No results for input(s): VITAMINB12, FOLATE, FERRITIN, TIBC, IRON, RETICCTPCT in the last 72 hours. Urine analysis:    Component Value Date/Time   COLORURINE YELLOW 06/20/2016 0005   APPEARANCEUR CLEAR 06/20/2016 0005   LABSPEC 1.013 06/20/2016 0005   PHURINE 5.0 06/20/2016 0005   GLUCOSEU >=500 (A) 06/20/2016  0005   HGBUR NEGATIVE 06/20/2016 0005   BILIRUBINUR NEGATIVE 06/20/2016 0005   KETONESUR 5 (A) 06/20/2016 0005   PROTEINUR NEGATIVE 06/20/2016 0005   UROBILINOGEN 0.2 05/25/2009 1224   NITRITE NEGATIVE 06/20/2016 0005   LEUKOCYTESUR NEGATIVE 06/20/2016 0005   Sepsis Labs: '@LABRCNTIP' (procalcitonin:4,lacticidven:4)    Results for orders placed or performed during the hospital encounter of 06/19/16  Blood culture (routine x 2)     Status: None (Preliminary result)   Collection Time: 06/19/16  8:55 PM  Result Value Ref Range Status   Specimen Description LEFT ANTECUBITAL  Final   Special Requests BOTTLES DRAWN AEROBIC AND ANAEROBIC 5CC  Final   Culture NO GROWTH 2 DAYS  Final   Report Status PENDING  Incomplete  Blood culture (routine x 2)     Status: None (Preliminary result)   Collection Time: 06/19/16  9:04 PM  Result Value Ref Range Status   Specimen Description BLOOD  Final   Special Requests NONE  Final   Culture NO GROWTH 2 DAYS  Final   Report Status PENDING  Incomplete  MRSA PCR Screening     Status: None   Collection Time: 06/19/16 11:00 PM  Result Value Ref Range Status   MRSA by PCR NEGATIVE NEGATIVE Final    Comment:        The GeneXpert MRSA Assay (FDA approved for NASAL specimens only), is one component of a comprehensive MRSA colonization surveillance program.  It is not intended to diagnose MRSA infection nor to guide or monitor treatment for MRSA infections.      Radiology Studies: Dg Chest Port 1 View Result Date: 06/19/2016 Heterogeneous opacities right mid and lower lung may represent pneumonia in the appropriate clinical setting. Followup PA and lateral chest X-ray is recommended in 3-4 weeks following trial of antibiotic therapy to ensure resolution and exclude underlying malignancy. Nonspecific left-greater-than-right apical pleuroparenchymal thickening.   Marland Kitchen arformoterol  15 mcg Nebulization BID  . aspirin EC  81 mg Oral Daily  . budesonide (PULMICORT) nebulizer solution  0.5 mg Nebulization BID  . ceFEPime (MAXIPIME) IV  2 g Intravenous Q24H  . escitalopram  5 mg Oral QHS  . feeding supplement (ENSURE ENLIVE)  237 mL Oral BID BM  . guaiFENesin  600 mg Oral BID  . ipratropium-albutero  3 mL Nebulization QID  . LORazepam  0.5 mg Oral Q8H  . methylPREDNISolo  40 mg Intravenous Daily  . rOPINIRole  0.5 mg Oral QHS    Continuous Infusions:   LOS: 2 days    Time spent: 25 minutes  Greater than 50% of the time spent on counseling and coordinating the care.   Leisa Lenz, MD [facility] Pager [phone]  If 7PM-7AM, please contact night-coverage [email or web address] Password [facility] 06/21/2016, 8:35 AM

## 2016-06-22 DIAGNOSIS — E43 Unspecified severe protein-calorie malnutrition: Secondary | ICD-10-CM

## 2016-06-22 LAB — BASIC METABOLIC PANEL
ANION GAP: 4 — AB (ref 5–15)
BUN: 8 mg/dL (ref 6–20)
CALCIUM: 8.7 mg/dL — AB (ref 8.9–10.3)
CO2: 31 mmol/L (ref 22–32)
Chloride: 94 mmol/L — ABNORMAL LOW (ref 101–111)
Creatinine, Ser: 0.55 mg/dL (ref 0.44–1.00)
GFR calc Af Amer: >60 mL/min (ref >60)
Glucose, Bld: 83 mg/dL (ref 65–99)
POTASSIUM: 4.7 mmol/L (ref 3.5–5.1)
SODIUM: 129 mmol/L — AB (ref 135–145)

## 2016-06-22 LAB — CBC
HCT: 27.9 % — ABNORMAL LOW (ref 36.0–46.0)
Hemoglobin: 9.1 g/dL — ABNORMAL LOW (ref 12.0–15.0)
MCH: 31.9 pg (ref 26.0–34.0)
MCHC: 32.6 g/dL (ref 30.0–36.0)
MCV: 97.9 fL (ref 78.0–100.0)
PLATELETS: 323 10*3/uL (ref 150–400)
RBC: 2.85 MIL/uL — AB (ref 3.87–5.11)
RDW: 13.5 % (ref 11.5–15.5)
WBC: 17.2 10*3/uL — AB (ref 4.0–10.5)

## 2016-06-22 NOTE — Progress Notes (Addendum)
Physical Therapy Treatment Patient Details Name: Denise Adams MRN: [id number] DOB: 1931-12-04 Today's Date: 06/22/2016    History of Present Illness Pt is an 83 y.o. female with medical history significant for hypertension, depression, tobacco abuse, COPD, peripheral vascular disease. Patient presented to [facility] with worsening shortness of breath and associated cough productive of yellowish sputum for past 1 week prior to this admission.     PT Comments    Pt presented supine in bed with HOB elevated, awake and willing to participate in therapy session. Pt making slow progress with mobility. She ambulated on 3L of supplemental O2 with SPO2 maintaining at 87-91%. Pt would continue to benefit from skilled physical therapy services at this time while admitted and after d/c to address her limitations in order to improve her overall safety and independence with functional mobility.     Follow Up Recommendations  Home health PT     Equipment Recommendations  Other (comment) (rollator with seat)    Recommendations for Other Services       Precautions / Restrictions Precautions Precautions: Fall;Other (comment) Precaution Comments: watch O2 sats Restrictions Weight Bearing Restrictions: No    Mobility  Bed Mobility Overal bed mobility: Needs Assistance Bed Mobility: Supine to Sit     Supine to sit: HOB elevated;Supervision     General bed mobility comments: increased time, use of bed rails with HOB elevated, supervision for safety  Transfers Overall transfer level: Needs assistance Equipment used: 4-wheeled walker Transfers: Sit to/from Stand Sit to Stand: Supervision         General transfer comment: increased time, pulled up with bilateral UEs on rollator, supervision for safety  Ambulation/Gait Ambulation/Gait assistance: Min guard Ambulation Distance (Feet): 75 Feet Assistive device: 4-wheeled walker Gait Pattern/deviations: Step-through  pattern;Decreased stride length;Trunk flexed Gait velocity: decreased Gait velocity interpretation: Below normal speed for age/gender General Gait Details: pt ambulated with use of daughter's rollator per pt's request. pt ambulated on 3L of O2 with SPO2 maintaining at 87-91% throughout.   Stairs            Wheelchair Mobility    Modified Rankin (Stroke Patients Only)       Balance Overall balance assessment: Needs assistance Sitting-balance support: Feet unsupported;No upper extremity supported Sitting balance-Leahy Scale: Fair     Standing balance support: Single extremity supported Standing balance-Leahy Scale: Poor Standing balance comment: unsteady in standing, needed UE support                    Cognition Arousal/Alertness: Awake/alert Behavior During Therapy: WFL for tasks assessed/performed Overall Cognitive Status: Within Functional Limits for tasks assessed                      Exercises      General Comments        Pertinent Vitals/Pain Pain Assessment: No/denies pain    Home Living                      Prior Function            PT Goals (current goals can now be found in the care plan section) Acute Rehab PT Goals PT Goal Formulation: With patient Time For Goal Achievement: 07/05/16 Potential to Achieve Goals: Fair Progress towards PT goals: Progressing toward goals    Frequency    Min 3X/week      PT Plan Current plan remains appropriate    Co-evaluation  End of Session Equipment Utilized During Treatment: Gait belt;Oxygen Activity Tolerance: Patient limited by fatigue Patient left: in bed;with call bell/phone within reach;with family/visitor present;with nursing/sitter in room (sitting EOB) Nurse Communication: Mobility status PT Visit Diagnosis: Unsteadiness on feet (R26.81);Muscle weakness (generalized) (M62.81)     Time: [id number] PT Time Calculation (min) (ACUTE ONLY): 19  min  Charges:  $Gait Training: 8-22 mins                    G Codes:       Alessandra BevelsJennifer M Cherlynn Popiel 06/22/2016, 11:04 AM Deborah ChalkJennifer Jencarlo Bonadonna, PT, DPT [phone]

## 2016-06-22 NOTE — Progress Notes (Signed)
Rt instructed pt and family on the use of flutter valve and incentive spirometer.  Pt was able to demonstrate back good technique on both devices.

## 2016-06-22 NOTE — Progress Notes (Signed)
Patient ID: NYGERIA LAGER, female   DOB: [birth year], 83 y.o.   MRN: [id number]  PROGRESS NOTE    SCOTTLYN MCHANEY  WUJ:[id number] DOB: 12-Feb-1932 DOA: 06/19/2016  PCP: Tivis Ringer, MD   Brief Narrative:  83 y.o. female with medical history significant for hypertension, depression, tobacco abuse, COPD, peripheral vascular disease. Patient presented to [facility] with worsening shortness of breath and associated cough productive of yellowish sputum for past 1 week prior to this admission. She smokes about 1-1/2 to one pack of cigarettes per day on average. Patient tried Mucinex over-the-counter which has caused her to have a dry cough for past 2 days. She also reported associated wheezing. She uses home inhalers including Symbicort and albuterol without significant symptomatic relief. No chest pain. No fevers. In ED, patient was afebrile, heart rate up to 138, respirations 2133, oxygen saturation 91% on 2 L nasal cannula oxygen support. Blood work demonstrated white blood cell count of 29, hemoglobin 11.3, sodium 1:30. Chest x-ray showed a right mid and lower lung opacities. Patient was given Levaquin in the ED and then was placed on vancomycin and cefepime while awaiting culture results.    Assessment & Plan:   Principal Problem: Sepsis secondary to lobar pneumonia / leukocytosis / lactic acidosis - Sepsis criteria met on the admission with tachycardia, tachypnea, hypoxia, hypotension, leukocytosis and lactic acidosis. Pneumonia is likely source of infection. - Chest x-ray on the admission showed opacities in the right mid and lower lung lobe - Stopped vanco 3/13 - Continue cefepime  - Blood cx negative  - Lactic acid WNL - WBC count better this am   Active Problems: Troponin elevation - Likely demand ischemia from sepsis and pneumonia - Troponin level flat, 0.032 - Last 2-D echo in 2013 showed ejection fraction 55% and grade 1 diastolic dysfunction  Acute COPD  exacerbation / acute respiratory failure with hypoxia - Continue oxygen support via nasal cannula to keep oxygen saturation above 90% - Continue current neb treatment: duoneb every 4 hours scheduled and every 2 hours as needed for shortness of breath - Continue Pulmicort and Brovana nebulizer twice daily - Continue Solu-Medrol 40 mg IV daily   Normocytic anemia - Hemoglobin on the admission 11.3 and repeat hemoglobin 8.8 - Hgb 9.1 this am  Hyponatremia - Likely in the setting of acute infectious process, pneumonia, sepsis, possibly dehydration versus HCTZ - Stopped hctz on 3/13 - Sodium 129 this am   Hypokalemia - Due to hctz - Supplemented and WNL   Restless leg syndrome - Continue Requip  Depression and anxiety - Continue Ativan and Lexapro  Tobacco abuse - Counseled on smoking cessation - Continue nicotine patch  Severe protein calorie malnutrition / Underweight  - In the context of acute illness - Seen by dietician     DVT prophylaxis: SCDs and aspirin  Code Status: full code  Family Communication: family not at the bedside this am  Disposition Plan: not yet stable for discharge; she is still rhonchorous on physical exam and short of breath with exertion    Consultants:   PT  Nutrition   Procedures:   None   Antimicrobials:   Vanco 06/19/2016 --> 06/20/2016  Cefepime 06/19/2016 -->   Subjective: Still short of breath with exertion.    Objective: Vitals:   06/22/16 0700 06/22/16 0800 06/22/16 0824 06/22/16 1200  BP: (!) 122/56 140/69 140/69 125/73  Pulse: 72 72 83 89  Resp: (!) _0 (!) 21  Temp: 99 F (37.2  C)   98.9 F (37.2 C)  TempSrc: Oral   Oral  SpO2: 99% 99% 94% 94%  Weight:      Height:        Intake/Output Summary (Last 24 hours) at 06/22/16 1334 Last data filed at 06/22/16 1300  Gross per 24 hour  Intake              530 ml  Output             1410 ml  Net             -880 ml   Filed Weights   06/19/16 2215 06/19/16  2300 06/21/16 0400  Weight: 42.1 kg (92 lb 13 oz) 42.6 kg (93 lb 14.7 oz) 44.1 kg (97 lb 3.6 oz)    Examination:  General exam: mild distress due to shortness of breath  Respiratory system: rhonchoros breath sounds, wheezing in upper lung lobes  Cardiovascular system: S1 & S2 heard, RRR Gastrointestinal system: (+) BS, non tender abdomen  Central nervous system: No focal neurological deficits. Extremities: No swelling, palpable pulses  Skin: warm, dry  Psychiatry: Normal mood and behavior    Data Reviewed: I have personally reviewed following labs and imaging studies  CBC:  Recent Labs Lab 06/19/16 1920 06/20/16 0248 06/20/16 0757 06/21/16 0255 06/22/16 0314  WBC 29.0* 29.2*  --  29.4* 17.2*  NEUTROABS 25.8*  --   --   --   --   HGB 11.3* 8.8* 9.3* 9.0* 9.1*  HCT 33.9* 26.2* 28.1* 27.5* 27.9*  MCV 98.0 97.8  --  96.5 97.9  PLT 378 301  --  309 951   Basic Metabolic Panel:  Recent Labs Lab 06/19/16 1920 06/20/16 0248 06/21/16 0255 06/22/16 0314  NA 130* 129* 131* 129*  K 4.0 3.4* 4.7 4.7  CL 91* 97* 96* 94*  CO2 _0 GLUCOSE 118* 197* 94 83  BUN _1 CREATININE 0.68 0.57 0.50 0.55  CALCIUM 9.0 7.7* 8.8* 8.7*   GFR: Estimated Creatinine Clearance: 37.7 mL/min (by C-G formula based on SCr of 0.55 mg/dL). Liver Function Tests:  Recent Labs Lab 06/19/16 1920  AST 30  ALT 19  ALKPHOS 83  BILITOT 0.9  PROT 5.7*  ALBUMIN 3.2*   No results for input(s): LIPASE, AMYLASE in the last 168 hours. No results for input(s): AMMONIA in the last 168 hours. Coagulation Profile: No results for input(s): INR, PROTIME in the last 168 hours. Cardiac Enzymes:  Recent Labs Lab 06/20/16 0037 06/20/16 0248  TROPONINI 0.03* 0.03*   BNP (last 3 results) No results for input(s): PROBNP in the last 8760 hours. HbA1C: No results for input(s): HGBA1C in the last 72 hours. CBG: No results for input(s): GLUCAP in the last 168 hours. Lipid Profile: No  results for input(s): CHOL, HDL, LDLCALC, TRIG, CHOLHDL, LDLDIRECT in the last 72 hours. Thyroid Function Tests:  Recent Labs  06/20/16 0248  TSH 1.374   Anemia Panel: No results for input(s): VITAMINB12, FOLATE, FERRITIN, TIBC, IRON, RETICCTPCT in the last 72 hours. Urine analysis:    Component Value Date/Time   COLORURINE YELLOW 06/20/2016 0005   APPEARANCEUR CLEAR 06/20/2016 0005   LABSPEC 1.013 06/20/2016 0005   PHURINE 5.0 06/20/2016 0005   GLUCOSEU >=500 (A) 06/20/2016 0005   HGBUR NEGATIVE 06/20/2016 0005   BILIRUBINUR NEGATIVE 06/20/2016 0005   KETONESUR 5 (A) 06/20/2016 0005   PROTEINUR NEGATIVE 06/20/2016 0005   UROBILINOGEN 0.2 05/25/2009 1224  NITRITE NEGATIVE 06/20/2016 0005   LEUKOCYTESUR NEGATIVE 06/20/2016 0005   Sepsis Labs: _0 (procalcitonin:4,lacticidven:4)    Results for orders placed or performed during the hospital encounter of 06/19/16  Blood culture (routine x 2)     Status: None (Preliminary result)   Collection Time: 06/19/16  8:55 PM  Result Value Ref Range Status   Specimen Description LEFT ANTECUBITAL  Final   Special Requests BOTTLES DRAWN AEROBIC AND ANAEROBIC 5CC  Final   Culture NO GROWTH 2 DAYS  Final   Report Status PENDING  Incomplete  Blood culture (routine x 2)     Status: None (Preliminary result)   Collection Time: 06/19/16  9:04 PM  Result Value Ref Range Status   Specimen Description BLOOD  Final   Special Requests NONE  Final   Culture NO GROWTH 2 DAYS  Final   Report Status PENDING  Incomplete  MRSA PCR Screening     Status: None   Collection Time: 06/19/16 11:00 PM  Result Value Ref Range Status   MRSA by PCR NEGATIVE NEGATIVE Final    Comment:        The GeneXpert MRSA Assay (FDA approved for NASAL specimens only), is one component of a comprehensive MRSA colonization surveillance program. It is not intended to diagnose MRSA infection nor to guide or monitor treatment for MRSA infections.       Radiology Studies: Dg Chest Port 1 View Result Date: 06/19/2016 Heterogeneous opacities right mid and lower lung may represent pneumonia in the appropriate clinical setting. Followup PA and lateral chest X-ray is recommended in 3-4 weeks following trial of antibiotic therapy to ensure resolution and exclude underlying malignancy. Nonspecific left-greater-than-right apical pleuroparenchymal thickening.   Marland Kitchen arformoterol  15 mcg Nebulization BID  . aspirin EC  81 mg Oral Daily  . budesonide (PULMICORT) nebulizer solution  0.5 mg Nebulization BID  . ceFEPime (MAXIPIME) IV  2 g Intravenous Q24H  . escitalopram  5 mg Oral QHS  . feeding supplement (ENSURE ENLIVE)  237 mL Oral BID BM  . guaiFENesin  600 mg Oral BID  . ipratropium-albutero  3 mL Nebulization QID  . LORazepam  0.5 mg Oral Q8H  . methylPREDNISolo  40 mg Intravenous Daily  . rOPINIRole  0.5 mg Oral QHS    Continuous Infusions:   LOS: 3 days    Time spent: 25 minutes  Greater than 50% of the time spent on counseling and coordinating the care.   Leisa Lenz, MD [facility] Pager [phone]  If 7PM-7AM, please contact night-coverage [email or web address] Password [email or web address] 06/22/2016, 1:34 PM

## 2016-06-23 DIAGNOSIS — J189 Pneumonia, unspecified organism: Secondary | ICD-10-CM

## 2016-06-23 LAB — CBC
HEMATOCRIT: 28.6 % — AB (ref 36.0–46.0)
HEMOGLOBIN: 9.4 g/dL — AB (ref 12.0–15.0)
MCH: 32.4 pg (ref 26.0–34.0)
MCHC: 32.9 g/dL (ref 30.0–36.0)
MCV: 98.6 fL (ref 78.0–100.0)
Platelets: 297 10*3/uL (ref 150–400)
RBC: 2.9 MIL/uL — ABNORMAL LOW (ref 3.87–5.11)
RDW: 13.6 % (ref 11.5–15.5)
WBC: 12.8 10*3/uL — ABNORMAL HIGH (ref 4.0–10.5)

## 2016-06-23 LAB — BASIC METABOLIC PANEL
ANION GAP: 7 (ref 5–15)
BUN: 15 mg/dL (ref 6–20)
CALCIUM: 9.1 mg/dL (ref 8.9–10.3)
CO2: 34 mmol/L — AB (ref 22–32)
Chloride: 92 mmol/L — ABNORMAL LOW (ref 101–111)
Creatinine, Ser: 0.52 mg/dL (ref 0.44–1.00)
GFR calc non Af Amer: >60 mL/min (ref >60)
Glucose, Bld: 146 mg/dL — ABNORMAL HIGH (ref 65–99)
Potassium: 4.6 mmol/L (ref 3.5–5.1)
SODIUM: 133 mmol/L — AB (ref 135–145)

## 2016-06-23 LAB — GLUCOSE, CAPILLARY: Glucose-Capillary: 255 mg/dL — ABNORMAL HIGH (ref 65–99)

## 2016-06-23 NOTE — Progress Notes (Addendum)
Benefit Check sent for Brovana. Natalie A Littreal  Jessicah Croll, RN        Brovana neb solution 15mcg BID is covered under Tier 4 Drug. No Prior Berkley Harveyuth is needed. Copay for 30 day supply is $201.84 Copay for 90 day supply available at pharmacy is $573.25   Generic is not covered under plan and [employer] did not have a retail price for Generic.    Thanks,   Dorene Grebeatalie    CM will follow patient for assistance if discharged on Brovana. Patient states she has nebulizer at home.

## 2016-06-23 NOTE — Progress Notes (Signed)
Physical Therapy Treatment Patient Details Name: Denise Adams MRN: [id number] DOB: 1931-05-14 Today's Date: 06/23/2016    History of Present Illness Pt is an 83 y.o. female with medical history significant for hypertension, depression, tobacco abuse, COPD, peripheral vascular disease. Patient presented to [facility] with worsening shortness of breath and associated cough productive of yellowish sputum for past 1 week prior to this admission.     PT Comments    Pt making progress with mobility; however, would increase safety and decrease risk of falls if pt had an AD to use at home. Pt has been using daughter's rollator while at the hospital but really needs one for herself. Pt ambulated on 6L of O2 with SPO2 maintaining >95%. Pt would continue to benefit from skilled physical therapy services at this time while admitted and after d/c to address her limitations in order to improve her overall safety and independence with functional mobility.     Follow Up Recommendations  Home health PT     Equipment Recommendations  Other (comment) (rollator with seat)    Recommendations for Other Services       Precautions / Restrictions Precautions Precautions: Fall;Other (comment) Precaution Comments: watch O2 sats Restrictions Weight Bearing Restrictions: No    Mobility  Bed Mobility Overal bed mobility: Needs Assistance Bed Mobility: Supine to Sit     Supine to sit: HOB elevated;Supervision     General bed mobility comments: increased time, use of bed rails with HOB elevated, supervision for safety  Transfers Overall transfer level: Needs assistance Equipment used: None Transfers: Sit to/from Stand Sit to Stand: Min guard         General transfer comment: increased time, min guard for safety  Ambulation/Gait Ambulation/Gait assistance: Min guard;Min assist Ambulation Distance (Feet): 75 Feet Assistive device: None;1 person hand held assist Gait Pattern/deviations:  Step-to pattern;Step-through pattern;Decreased step length - right;Decreased step length - left;Decreased stride length;Shuffle;Drifts right/left Gait velocity: decreased Gait velocity interpretation: Below normal speed for age/gender General Gait Details: pt ambulated ~40' with one person HHA and then with no AD; however, modest instability and required min A frequently for LOB laterally with head turns and directional changes   Stairs            Wheelchair Mobility    Modified Rankin (Stroke Patients Only)       Balance Overall balance assessment: Needs assistance Sitting-balance support: Feet unsupported;No upper extremity supported Sitting balance-Leahy Scale: Fair     Standing balance support: During functional activity;No upper extremity supported Standing balance-Leahy Scale: Fair                      Cognition Arousal/Alertness: Awake/alert Behavior During Therapy: WFL for tasks assessed/performed Overall Cognitive Status: Within Functional Limits for tasks assessed                      Exercises      General Comments        Pertinent Vitals/Pain Pain Assessment: No/denies pain    Home Living                      Prior Function            PT Goals (current goals can now be found in the care plan section) Acute Rehab PT Goals PT Goal Formulation: With patient Time For Goal Achievement: 07/05/16 Potential to Achieve Goals: Fair Progress towards PT goals: Progressing toward goals  Frequency    Min 3X/week      PT Plan Current plan remains appropriate    Co-evaluation             End of Session Equipment Utilized During Treatment: Gait belt;Oxygen (6L of O2 with activity) Activity Tolerance: Patient tolerated treatment well Patient left: in chair;with call bell/phone within reach Nurse Communication: Mobility status PT Visit Diagnosis: Unsteadiness on feet (R26.81);Muscle weakness (generalized) (M62.81)      Time: [id number] PT Time Calculation (min) (ACUTE ONLY): 19 min  Charges:  $Gait Training: 8-22 mins                    G Codes:       Denise BevelsJennifer M Matheson Adams 06/23/2016, 9:21 AM Denise ChalkJennifer Cervando Adams, PT, DPT [phone]

## 2016-06-23 NOTE — Progress Notes (Signed)
Patient ID: Denise Adams, female   DOB: 01-Aug-1931, 83 y.o.   MRN: [id number]  PROGRESS NOTE    MERIN BORJON  HYH:[id number] DOB: 12/21/1931 DOA: 06/19/2016  PCP: Tivis Ringer, MD   Brief Narrative:  83 y.o. female with medical history significant for hypertension, depression, tobacco abuse, COPD, peripheral vascular disease. Patient presented to [facility] with worsening shortness of breath and associated cough productive of yellowish sputum for past 1 week prior to this admission. She smokes about 1-1/2 to one pack of cigarettes per day on average. Patient tried Mucinex over-the-counter which has caused her to have a dry cough for past 2 days. She also reported associated wheezing. She uses home inhalers including Symbicort and albuterol without significant symptomatic relief. No chest pain. No fevers. In ED, patient was afebrile, heart rate up to 138, respirations 2133, oxygen saturation 91% on 2 L nasal cannula oxygen support. Blood work demonstrated white blood cell count of 29, hemoglobin 11.3, sodium 1:30. Chest x-ray showed a right mid and lower lung opacities. Patient was given Levaquin in the ED and then was placed on vancomycin and cefepime while awaiting culture results.  Pt is still not at her baseline in regards to respiratory state. She states she is still short of breath with exertion.     Assessment & Plan:   Principal Problem: Sepsis secondary to lobar pneumonia / leukocytosis / lactic acidosis - Sepsis criteria met on the admission with tachycardia, tachypnea, hypoxia, hypotension, leukocytosis and lactic acidosis. Pneumonia is likely source of infection. - Chest x-ray on the admission showed opacities in the right mid and lower lung lobe - Stopped vanco 3/13 - We will continue cefepime for now  - Blood cx are negative to date  - Lactic acid WNL - WBC count continues to improve   Active Problems: Troponin elevation - This is likely demand ischemia  from sepsis and pneumonia - Troponin level flat, 0.032 - Last 2-D echo in 2013 showed ejection fraction 55% and grade 1 diastolic dysfunction  Acute COPD exacerbation / acute respiratory failure with hypoxia - Continue current neb treatment: duoneb every 4 hours scheduled and every 2 hours as needed for shortness of breath - We will continue Pulmicort and Brovana nebulizer twice daily - We will continue Solu-Medrol 40 mg IV daily  - Continue oxygen support via nasal cannula to keep oxygen saturation above 90%  Normocytic anemia - Hemoglobin on the admission 11.3 and repeat hemoglobin 8.8 - Hgb 9.4, overall stable   Hyponatremia - Likely in the setting of acute infectious process, pneumonia, sepsis, possibly dehydration versus HCTZ - Stopped hctz on 3/13 - Sodium now much better 133 this am   Hypokalemia - Due to hctz - Supplemented and WNL   Restless leg syndrome - Continue Requip - Improved   Depression and anxiety - Continue Ativan and Lexapro - Stable mental status   Tobacco abuse - Counseled on smoking cessation - Continue nicotine patch  Severe protein calorie malnutrition / Underweight  - In the context of acute illness - Seen by dietician     DVT prophylaxis: SCDs and aspirin  Code Status: full code  Family Communication: family not at the bedside this am  Disposition Plan: home likely in next 1-2 days    Consultants:   PT  Nutrition   Procedures:   None   Antimicrobials:   Vanco 06/19/2016 --> 06/20/2016  Cefepime 06/19/2016 -->   Subjective: Pt short of breath with exertion.    Objective:  Vitals:   06/22/16 2000 06/23/16 0000 06/23/16 0400 06/23/16 0825  BP: (!) 148/77 140/61 (!) 149/64 (!) 133/99  Pulse: 81 73 73   Resp: 20 16 (!) 23   Temp: 99 F (37.2 C) 98.3 F (36.8 C) 98.6 F (37 C) 98.6 F (37 C)  TempSrc: Oral Oral  Oral  SpO2: 98% 96% 99%   Weight:      Height:        Intake/Output Summary (Last 24 hours) at 06/23/16  0923 Last data filed at 06/23/16 0837  Gross per 24 hour  Intake              770 ml  Output             1200 ml  Net             -430 ml   Filed Weights   06/19/16 2215 06/19/16 2300 06/21/16 0400  Weight: 42.1 kg (92 lb 13 oz) 42.6 kg (93 lb 14.7 oz) 44.1 kg (97 lb 3.6 oz)    Examination:  General exam: no distress while resting in bed  Respiratory system: rhonchoros breath sounds, deep breath provoke coughing spells  Cardiovascular system: S1 & S2 heard, Rate controlled  Gastrointestinal system: (+) BS, non tender abdomen  Central nervous system: Nonfocal Extremities: No swelling, palpable pulses bilaterally  Skin: warm, dry, no ulcers or lesions  Psychiatry: Normal mood and behavior, no agitation or restlessness   Data Reviewed: I have personally reviewed following labs and imaging studies  CBC:  Recent Labs Lab 06/19/16 1920 06/20/16 0248 06/20/16 0757 06/21/16 0255 06/22/16 0314 06/23/16 0238  WBC 29.0* 29.2*  --  29.4* 17.2* 12.8*  NEUTROABS 25.8*  --   --   --   --   --   HGB 11.3* 8.8* 9.3* 9.0* 9.1* 9.4*  HCT 33.9* 26.2* 28.1* 27.5* 27.9* 28.6*  MCV 98.0 97.8  --  96.5 97.9 98.6  PLT 378 301  --  309 323 357   Basic Metabolic Panel:  Recent Labs Lab 06/19/16 1920 06/20/16 0248 06/21/16 0255 06/22/16 0314 06/23/16 0238  NA 130* 129* 131* 129* 133*  K 4.0 3.4* 4.7 4.7 4.6  CL 91* 97* 96* 94* 92*  CO2 _0 34*  GLUCOSE 118* 197* 94 83 146*  BUN _1 CREATININE 0.68 0.57 0.50 0.55 0.52  CALCIUM 9.0 7.7* 8.8* 8.7* 9.1   GFR: Estimated Creatinine Clearance: 37.7 mL/min (by C-G formula based on SCr of 0.52 mg/dL). Liver Function Tests:  Recent Labs Lab 06/19/16 1920  AST 30  ALT 19  ALKPHOS 83  BILITOT 0.9  PROT 5.7*  ALBUMIN 3.2*   No results for input(s): LIPASE, AMYLASE in the last 168 hours. No results for input(s): AMMONIA in the last 168 hours. Coagulation Profile: No results for input(s): INR, PROTIME in the last  168 hours. Cardiac Enzymes:  Recent Labs Lab 06/20/16 0037 06/20/16 0248  TROPONINI 0.03* 0.03*   BNP (last 3 results) No results for input(s): PROBNP in the last 8760 hours. HbA1C: No results for input(s): HGBA1C in the last 72 hours. CBG: No results for input(s): GLUCAP in the last 168 hours. Lipid Profile: No results for input(s): CHOL, HDL, LDLCALC, TRIG, CHOLHDL, LDLDIRECT in the last 72 hours. Thyroid Function Tests: No results for input(s): TSH, T4TOTAL, FREET4, T3FREE, THYROIDAB in the last 72 hours. Anemia Panel: No results for input(s): VITAMINB12, FOLATE, FERRITIN, TIBC, IRON, RETICCTPCT in the  last 72 hours. Urine analysis:    Component Value Date/Time   COLORURINE YELLOW 06/20/2016 0005   APPEARANCEUR CLEAR 06/20/2016 0005   LABSPEC 1.013 06/20/2016 0005   PHURINE 5.0 06/20/2016 0005   GLUCOSEU >=500 (A) 06/20/2016 0005   HGBUR NEGATIVE 06/20/2016 0005   BILIRUBINUR NEGATIVE 06/20/2016 0005   KETONESUR 5 (A) 06/20/2016 0005   PROTEINUR NEGATIVE 06/20/2016 0005   UROBILINOGEN 0.2 05/25/2009 1224   NITRITE NEGATIVE 06/20/2016 0005   LEUKOCYTESUR NEGATIVE 06/20/2016 0005   Sepsis Labs: _0 (procalcitonin:4,lacticidven:4)    Results for orders placed or performed during the hospital encounter of 06/19/16  Blood culture (routine x 2)     Status: None (Preliminary result)   Collection Time: 06/19/16  8:55 PM  Result Value Ref Range Status   Specimen Description LEFT ANTECUBITAL  Final   Special Requests BOTTLES DRAWN AEROBIC AND ANAEROBIC 5CC  Final   Culture NO GROWTH 3 DAYS  Final   Report Status PENDING  Incomplete  Blood culture (routine x 2)     Status: None (Preliminary result)   Collection Time: 06/19/16  9:04 PM  Result Value Ref Range Status   Specimen Description BLOOD  Final   Special Requests NONE  Final   Culture NO GROWTH 3 DAYS  Final   Report Status PENDING  Incomplete  MRSA PCR Screening     Status: None   Collection Time:  06/19/16 11:00 PM  Result Value Ref Range Status   MRSA by PCR NEGATIVE NEGATIVE Final    Comment:        The GeneXpert MRSA Assay (FDA approved for NASAL specimens only), is one component of a comprehensive MRSA colonization surveillance program. It is not intended to diagnose MRSA infection nor to guide or monitor treatment for MRSA infections.      Radiology Studies: Dg Chest Port 1 View Result Date: 06/19/2016 Heterogeneous opacities right mid and lower lung may represent pneumonia in the appropriate clinical setting. Followup PA and lateral chest X-ray is recommended in 3-4 weeks following trial of antibiotic therapy to ensure resolution and exclude underlying malignancy. Nonspecific left-greater-than-right apical pleuroparenchymal thickening.   Marland Kitchen arformoterol  15 mcg Nebulization BID  . aspirin EC  81 mg Oral Daily  . budesonide (PULMICORT) nebulizer solution  0.5 mg Nebulization BID  . ceFEPime (MAXIPIME) IV  2 g Intravenous Q24H  . escitalopram  5 mg Oral QHS  . feeding supplement (ENSURE ENLIVE)  237 mL Oral BID BM  . guaiFENesin  600 mg Oral BID  . ipratropium-albutero  3 mL Nebulization QID  . LORazepam  0.5 mg Oral Q8H  . methylPREDNISolo  40 mg Intravenous Daily  . rOPINIRole  0.5 mg Oral QHS    Continuous Infusions:   LOS: 4 days    Time spent: 25 minutes  Greater than 50% of the time spent on counseling and coordinating the care.   Leisa Lenz, MD [facility] Pager [phone]  If 7PM-7AM, please contact night-coverage [email or web address] Password [facility] 06/23/2016, 9:23 AM

## 2016-06-23 NOTE — Care Management Important Message (Signed)
Important Message  Patient Details  Name: Denise Adams MRN: [id number] Date of Birth: [birth year]   Medicare Important Message Given:  Yes    Denise Adams, Denise Adams 06/23/2016, 3:52 PM

## 2016-06-24 DIAGNOSIS — D72829 Elevated white blood cell count, unspecified: Secondary | ICD-10-CM

## 2016-06-24 LAB — CBC
HCT: 28.9 % — ABNORMAL LOW (ref 36.0–46.0)
Hemoglobin: 9.4 g/dL — ABNORMAL LOW (ref 12.0–15.0)
MCH: 32.5 pg (ref 26.0–34.0)
MCHC: 32.5 g/dL (ref 30.0–36.0)
MCV: 100 fL (ref 78.0–100.0)
PLATELETS: 331 10*3/uL (ref 150–400)
RBC: 2.89 MIL/uL — ABNORMAL LOW (ref 3.87–5.11)
RDW: 13.7 % (ref 11.5–15.5)
WBC: 12.3 10*3/uL — AB (ref 4.0–10.5)

## 2016-06-24 LAB — BASIC METABOLIC PANEL
ANION GAP: 5 (ref 5–15)
BUN: 18 mg/dL (ref 6–20)
CALCIUM: 8.8 mg/dL — AB (ref 8.9–10.3)
CO2: 36 mmol/L — ABNORMAL HIGH (ref 22–32)
CREATININE: 0.52 mg/dL (ref 0.44–1.00)
Chloride: 92 mmol/L — ABNORMAL LOW (ref 101–111)
Glucose, Bld: 105 mg/dL — ABNORMAL HIGH (ref 65–99)
Potassium: 4 mmol/L (ref 3.5–5.1)
SODIUM: 133 mmol/L — AB (ref 135–145)

## 2016-06-24 LAB — CULTURE, BLOOD (ROUTINE X 2)
CULTURE: NO GROWTH
Culture: NO GROWTH

## 2016-06-24 MED ORDER — PREDNISONE 50 MG PO TABS
50.0000 mg | ORAL_TABLET | Freq: Every day | ORAL | Status: DC
  Administered 2016-06-25 – 2016-06-26 (×2): 50 mg via ORAL
  Filled 2016-06-24 (×2): qty 1

## 2016-06-24 MED ORDER — POLYETHYLENE GLYCOL 3350 17 G PO PACK
17.0000 g | PACK | Freq: Every day | ORAL | Status: DC
  Administered 2016-06-24 – 2016-06-26 (×3): 17 g via ORAL
  Filled 2016-06-24 (×3): qty 1

## 2016-06-24 NOTE — Progress Notes (Signed)
Report given to RN receiving patient on 255W. Patient transferred via bed to 5W08. Family at bedside and will be moving with her. Belongings at bedside included cell phone, charger, and glasses. Patient's daughter with cerebral palsy at bedside and will transfer with patient.  Noe GensStefanie A Amiliah Campisi, RN

## 2016-06-24 NOTE — Progress Notes (Signed)
Patient ambulated halfway down the hall using daughter's walkker without incident mainting O2 sats above 84% -92%.  Patient did complain and really wanted to walk longer!

## 2016-06-24 NOTE — Plan of Care (Signed)
Problem: Education: Goal: Knowledge of [facility] Education information/materials will improve Outcome: Progressing Discussed plan of care with patient. Patient family at bedside aware of her needs. Patient expects to be moved 06/24/16 to another unit or discharged from hospital. Explained that the doctor will make this decision. Patient a current smoker but has denied need for nicotine patch at this time. She expresses desire to quit smoking.   Problem: Activity: Goal: Risk for activity intolerance will decrease Outcome: Progressing Patient able to turn [facility] She is somewhat unsteady but able to move independently with standby assist.   Problem: Nutrition: Goal: Adequate nutrition will be maintained Outcome: Progressing Patient with good appetite. However, family brought in [employer] for meal. Patient is on a cardiac diet- teaching performed.  Problem: Bowel/Gastric: Goal: Will not experience complications related to bowel motility Outcome: Progressing Patient has not had a bowel movement since 3/14. Will monitor for signs of constipation and intervene as necessary.

## 2016-06-24 NOTE — Progress Notes (Signed)
Physical Therapy Treatment Patient Details Name: Denise Adams MRN: [id number] DOB: Apr 09, 1932 Today's Date: 06/24/2016    History of Present Illness Pt is an 83 y.o. female with medical history significant for hypertension, depression, tobacco abuse, COPD, peripheral vascular disease. Patient presented to [facility] with worsening shortness of breath and associated cough productive of yellowish sputum for past 1 week prior to this admission.     PT Comments    Pt making great progress with mobility, increasing distance ambulated and demonstrated improved endurance. Pt ambulated on 2L of O2 with SPO2 maintaining >89% throughout. Pt would continue to benefit from skilled physical therapy services at this time while admitted and after d/c to address her limitations in order to improve her overall safety and independence with functional mobility.     Follow Up Recommendations  Home health PT     Equipment Recommendations  Other (comment) (rollator with seat)    Recommendations for Other Services       Precautions / Restrictions Precautions Precautions: Fall;Other (comment) Precaution Comments: watch O2 sats Restrictions Weight Bearing Restrictions: No    Mobility  Bed Mobility Overal bed mobility: Needs Assistance Bed Mobility: Supine to Sit     Supine to sit: HOB elevated;Supervision Sit to supine: Supervision   General bed mobility comments: increased time, use of bed rails with HOB elevated, supervision for safety  Transfers Overall transfer level: Needs assistance Equipment used: 4-wheeled walker Transfers: Sit to/from Stand Sit to Stand: Supervision         General transfer comment: increased time, supervision for safety  Ambulation/Gait Ambulation/Gait assistance: Min guard Ambulation Distance (Feet): 125 Feet Assistive device: 4-wheeled walker Gait Pattern/deviations: Step-to pattern;Step-through pattern;Decreased step length - right;Decreased step  length - left;Decreased stride length;Shuffle;Drifts right/left Gait velocity: decreased Gait velocity interpretation: Below normal speed for age/gender General Gait Details: pt with greatly improved stability and endurance with ambulation. Pt ambulated on 2L of O2 with SPO2 maintaining >89% throughout.   Stairs            Wheelchair Mobility    Modified Rankin (Stroke Patients Only)       Balance Overall balance assessment: Needs assistance Sitting-balance support: Feet unsupported;No upper extremity supported Sitting balance-Leahy Scale: Good     Standing balance support: During functional activity;No upper extremity supported Standing balance-Leahy Scale: Fair                      Cognition Arousal/Alertness: Awake/alert Behavior During Therapy: WFL for tasks assessed/performed Overall Cognitive Status: Within Functional Limits for tasks assessed                      Exercises      General Comments        Pertinent Vitals/Pain Pain Assessment: No/denies pain    Home Living                      Prior Function            PT Goals (current goals can now be found in the care plan section) Acute Rehab PT Goals PT Goal Formulation: With patient Time For Goal Achievement: 07/05/16 Potential to Achieve Goals: Fair Progress towards PT goals: Progressing toward goals    Frequency    Min 3X/week      PT Plan Current plan remains appropriate    Co-evaluation             End of Session Equipment  Utilized During Treatment: Gait belt;Oxygen (2L of O2) Activity Tolerance: Patient tolerated treatment well Patient left: in bed;with call bell/phone within reach;with family/visitor present Nurse Communication: Mobility status PT Visit Diagnosis: Unsteadiness on feet (R26.81);Muscle weakness (generalized) (M62.81)     Time: 1610-9604 PT Time Calculation (min) (ACUTE ONLY): 20 min  Charges:  $Gait Training: 8-22 mins                     G CodesAlessandra Bevels Mckinzee Adams 2016/07/19, 3:44 PM Denise Adams, PT, DPT [phone]

## 2016-06-24 NOTE — Progress Notes (Signed)
Attempted to call report with no answer after 10 mins to 5W room 8.

## 2016-06-24 NOTE — Progress Notes (Signed)
Attempt to call report  5W with no answer on hold .

## 2016-06-24 NOTE — Progress Notes (Addendum)
Pharmacy Antibiotic Note  Denise Adams is a 83 y.o. female admitted on 06/19/2016 with pneumonia.  Pharmacy has been consulted for cefepime dosing.  Day #6 of abx for PNA. CXR on 3/12 shows right mid and lower lung opacities. Afebrile, WBC elevated but decreased to 12.3 k/uL (on steroids), serum creatinine stable. Per MD note, patient was still short of breath with exertion yesterday.  Plan: Continue cefepime 2g IV Q24h Monitor clinical picture, renal function F/U length of therapy and/or ability to transition to PO option  Height: 5\' 3"  (160 cm) Weight: 97 lb 3.6 oz (44.1 kg) IBW/kg (Calculated) : 52.4  Temp (24hrs), Avg:98.6 F (37 C), Min:98.3 F (36.8 C), Max:99 F (37.2 C)   Recent Labs Lab 06/19/16 2145 06/20/16 0248 06/21/16 0255 06/22/16 0314 06/23/16 0238 06/24/16 0350  WBC  --  29.2* 29.4* 17.2* 12.8* 12.3*  CREATININE  --  0.57 0.50 0.55 0.52 0.52  LATICACIDVEN 2.8* 1.1 0.6  --   --   --     Estimated Creatinine Clearance: 37.7 mL/min (by C-G formula based on SCr of 0.52 mg/dL).    No Known Allergies  Antimicrobials this admission:  Cefepime 3/12 >>  Vancomycin 3/12 >> 3/13  Dose adjustments this admission:  N/a  Microbiology results:  3/12 BCx: ngtd 3/12 Sputum: sent  3/12 MRSA PCR: negative  [name], PharmD Acute Care Pharmacy Resident  Pager: [phone] 06/24/2016

## 2016-06-24 NOTE — Progress Notes (Signed)
Attempt to call Report again to 5W with RN saying she isn t sure that patient is for the floor.

## 2016-06-24 NOTE — Progress Notes (Signed)
Patient ID: Denise Adams, female   DOB: 1931/10/27, 83 y.o.   MRN: [id number]  PROGRESS NOTE    Denise Adams  JEH:[id number] DOB: 07-20-1931 DOA: 06/19/2016  PCP: Tivis Ringer, MD   Brief Narrative:  83 y.o. female with medical history significant for hypertension, depression, tobacco abuse, COPD, peripheral vascular disease. Patient presented to [facility] with worsening shortness of breath and associated cough productive of yellowish sputum for past 1 week prior to this admission. She smokes about 1-1/2 to one pack of cigarettes per day on average. Patient tried Mucinex over-the-counter which has caused her to have a dry cough for past 2 days. She also reported associated wheezing. She uses home inhalers including Symbicort and albuterol without significant symptomatic relief. No chest pain. No fevers. In ED, patient was afebrile, heart rate up to 138, respirations 2133, oxygen saturation 91% on 2 L nasal cannula oxygen support. Blood work demonstrated white blood cell count of 29, hemoglobin 11.3, sodium 1:30. Chest x-ray showed a right mid and lower lung opacities. Patient was given Levaquin in the ED and then was placed on vancomycin and cefepime while awaiting culture results.  Assessment & Plan:   Principal Problem: Sepsis secondary to lobar pneumonia / leukocytosis / lactic acidosis - Sepsis criteria met on the admission with tachycardia, tachypnea, hypoxia, hypotension, leukocytosis and lactic acidosis. Pneumonia is likely source of infection. - Chest x-ray on the admission showed opacities in the right mid and lower lung lobe - Stopped vanco 3/13 - Continue cefepime  - Blood cx are negative to date  - Lactic acid WNL  Active Problems: Troponin elevation - Demand ischemia from sepsis and pneumonia - Troponin level flat, 0.032 - Last 2-D echo in 2013 showed ejection fraction 55% and grade 1 diastolic dysfunction  Acute COPD exacerbation / acute respiratory  failure with hypoxia - Continue current neb treatment: duoneb every 4 hours scheduled and every 2 hours as needed for shortness of breath - Continue Pulmicort and Brovana nebulizer twice daily - Stop solumedrol today and use prednisone starting tomorrow  - Continue oxygen support via nasal cannula to keep oxygen saturation above 90%  Normocytic anemia - Hemoglobin on the admission 11.3 and repeat hemoglobin 8.8 - Hgb stable   Hyponatremia - Likely in the setting of acute infectious process, pneumonia, sepsis, possibly dehydration versus HCTZ - Stopped hctz on 3/13 - Sodium stable at 133  Hypokalemia - Due to hctz - Supplemented, WNL   Restless leg syndrome - Continue Requip  Depression and anxiety - Continue Ativan and Lexapro  Tobacco abuse - Counseled on smoking cessation - Continue nicotine patch  Severe protein calorie malnutrition / Underweight  - In the context of acute illness - Continue nutritional supplementation      DVT prophylaxis: SCDs and aspirin  Code Status: full code  Family Communication: family not at the bedside this am  Disposition Plan: home tomorrow; transfer to tele today   Consultants:   PT  Nutrition   Procedures:   None   Antimicrobials:   Vanco 06/19/2016 --> 06/20/2016  Cefepime 06/19/2016 -->   Subjective: Little better this am.   Objective: Vitals:   06/24/16 0500 06/24/16 0600 06/24/16 0721 06/24/16 0832  BP: (!) 130/55 (!) 150/68 137/60 (!) 124/57  Pulse: 71 84 68 68  Resp: _0 Temp:   98.5 F (36.9 C)   TempSrc:   Oral   SpO2: 95% 95% 99% 93%  Weight:  Height:        Intake/Output Summary (Last 24 hours) at 06/24/16 1115 Last data filed at 06/24/16 0343  Gross per 24 hour  Intake              530 ml  Output             1075 ml  Net             -545 ml   Filed Weights   06/19/16 2215 06/19/16 2300 06/21/16 0400  Weight: 42.1 kg (92 lb 13 oz) 42.6 kg (93 lb 14.7 oz) 44.1 kg (97 lb 3.6 oz)     Examination:  General exam: calm , comfortable  Respiratory system: rhonchoros breath sounds, less wheezing this am Cardiovascular system: S1 & S2 heard, Rate controlled  Gastrointestinal system: (+) BS, no distention  Central nervous system: No focal deficits  Extremities: No swelling, palpable pulses bilaterally  Skin: warm, dry Psychiatry: no agitation or restlessness   Data Reviewed: I have personally reviewed following labs and imaging studies  CBC:  Recent Labs Lab 06/19/16 1920 06/20/16 0248 06/20/16 0757 06/21/16 0255 06/22/16 0314 06/23/16 0238 06/24/16 0350  WBC 29.0* 29.2*  --  29.4* 17.2* 12.8* 12.3*  NEUTROABS 25.8*  --   --   --   --   --   --   HGB 11.3* 8.8* 9.3* 9.0* 9.1* 9.4* 9.4*  HCT 33.9* 26.2* 28.1* 27.5* 27.9* 28.6* 28.9*  MCV 98.0 97.8  --  96.5 97.9 98.6 100.0  PLT 378 301  --  309 323 297 629   Basic Metabolic Panel:  Recent Labs Lab 06/20/16 0248 06/21/16 0255 06/22/16 0314 06/23/16 0238 06/24/16 0350  NA 129* 131* 129* 133* 133*  K 3.4* 4.7 4.7 4.6 4.0  CL 97* 96* 94* 92* 92*  CO2 _0 34* 36*  GLUCOSE 197* 94 83 146* 105*  BUN _1 CREATININE 0.57 0.50 0.55 0.52 0.52  CALCIUM 7.7* 8.8* 8.7* 9.1 8.8*   GFR: Estimated Creatinine Clearance: 37.7 mL/min (by C-G formula based on SCr of 0.52 mg/dL). Liver Function Tests:  Recent Labs Lab 06/19/16 1920  AST 30  ALT 19  ALKPHOS 83  BILITOT 0.9  PROT 5.7*  ALBUMIN 3.2*   No results for input(s): LIPASE, AMYLASE in the last 168 hours. No results for input(s): AMMONIA in the last 168 hours. Coagulation Profile: No results for input(s): INR, PROTIME in the last 168 hours. Cardiac Enzymes:  Recent Labs Lab 06/20/16 0037 06/20/16 0248  TROPONINI 0.03* 0.03*   BNP (last 3 results) No results for input(s): PROBNP in the last 8760 hours. HbA1C: No results for input(s): HGBA1C in the last 72 hours. CBG:  Recent Labs Lab 06/23/16 1600  GLUCAP 255*    Lipid Profile: No results for input(s): CHOL, HDL, LDLCALC, TRIG, CHOLHDL, LDLDIRECT in the last 72 hours. Thyroid Function Tests: No results for input(s): TSH, T4TOTAL, FREET4, T3FREE, THYROIDAB in the last 72 hours. Anemia Panel: No results for input(s): VITAMINB12, FOLATE, FERRITIN, TIBC, IRON, RETICCTPCT in the last 72 hours. Urine analysis:    Component Value Date/Time   COLORURINE YELLOW 06/20/2016 0005   APPEARANCEUR CLEAR 06/20/2016 0005   LABSPEC 1.013 06/20/2016 0005   PHURINE 5.0 06/20/2016 0005   GLUCOSEU >=500 (A) 06/20/2016 0005   HGBUR NEGATIVE 06/20/2016 0005   BILIRUBINUR NEGATIVE 06/20/2016 0005   KETONESUR 5 (A) 06/20/2016 0005   PROTEINUR NEGATIVE 06/20/2016 0005   UROBILINOGEN 0.2  05/25/2009 1224   NITRITE NEGATIVE 06/20/2016 0005   LEUKOCYTESUR NEGATIVE 06/20/2016 0005   Sepsis Labs: _0 (procalcitonin:4,lacticidven:4)    Results for orders placed or performed during the hospital encounter of 06/19/16  Blood culture (routine x 2)     Status: None (Preliminary result)   Collection Time: 06/19/16  8:55 PM  Result Value Ref Range Status   Specimen Description LEFT ANTECUBITAL  Final   Special Requests BOTTLES DRAWN AEROBIC AND ANAEROBIC 5CC  Final   Culture NO GROWTH 4 DAYS  Final   Report Status PENDING  Incomplete  Blood culture (routine x 2)     Status: None (Preliminary result)   Collection Time: 06/19/16  9:04 PM  Result Value Ref Range Status   Specimen Description BLOOD  Final   Special Requests NONE  Final   Culture NO GROWTH 4 DAYS  Final   Report Status PENDING  Incomplete  MRSA PCR Screening     Status: None   Collection Time: 06/19/16 11:00 PM  Result Value Ref Range Status   MRSA by PCR NEGATIVE NEGATIVE Final    Comment:        The GeneXpert MRSA Assay (FDA approved for NASAL specimens only), is one component of a comprehensive MRSA colonization surveillance program. It is not intended to diagnose MRSA infection nor to  guide or monitor treatment for MRSA infections.      Radiology Studies: Dg Chest Port 1 View Result Date: 06/19/2016 Heterogeneous opacities right mid and lower lung may represent pneumonia in the appropriate clinical setting. Followup PA and lateral chest X-ray is recommended in 3-4 weeks following trial of antibiotic therapy to ensure resolution and exclude underlying malignancy. Nonspecific left-greater-than-right apical pleuroparenchymal thickening.   Marland Kitchen arformoterol  15 mcg Nebulization BID  . aspirin EC  81 mg Oral Daily  . budesonide (PULMICORT) nebulizer solution  0.5 mg Nebulization BID  . ceFEPime (MAXIPIME) IV  2 g Intravenous Q24H  . escitalopram  5 mg Oral QHS  . feeding supplement (ENSURE ENLIVE)  237 mL Oral BID BM  . guaiFENesin  600 mg Oral BID  . ipratropium-albutero  3 mL Nebulization QID  . LORazepam  0.5 mg Oral Q8H  . methylPREDNISolo  40 mg Intravenous Daily  . rOPINIRole  0.5 mg Oral QHS    Continuous Infusions:   LOS: 5 days    Time spent: 25 minutes  Greater than 50% of the time spent on counseling and coordinating the care.   Leisa Lenz, MD [facility] Pager [phone]  If 7PM-7AM, please contact night-coverage [email or web address] Password [email or web address] 06/24/2016, 11:15 AM

## 2016-06-24 NOTE — Progress Notes (Signed)
Report received. Pt was clarified with CN to be going to bed 8.

## 2016-06-25 LAB — BASIC METABOLIC PANEL
ANION GAP: 3 — AB (ref 5–15)
BUN: 18 mg/dL (ref 6–20)
CALCIUM: 8.7 mg/dL — AB (ref 8.9–10.3)
CHLORIDE: 91 mmol/L — AB (ref 101–111)
CO2: 38 mmol/L — AB (ref 22–32)
CREATININE: 0.58 mg/dL (ref 0.44–1.00)
GFR calc non Af Amer: >60 mL/min (ref >60)
Glucose, Bld: 130 mg/dL — ABNORMAL HIGH (ref 65–99)
Potassium: 4.2 mmol/L (ref 3.5–5.1)
SODIUM: 132 mmol/L — AB (ref 135–145)

## 2016-06-25 LAB — CBC
HEMATOCRIT: 30.8 % — AB (ref 36.0–46.0)
HEMOGLOBIN: 9.8 g/dL — AB (ref 12.0–15.0)
MCH: 31.9 pg (ref 26.0–34.0)
MCHC: 31.8 g/dL (ref 30.0–36.0)
MCV: 100.3 fL — ABNORMAL HIGH (ref 78.0–100.0)
Platelets: 348 10*3/uL (ref 150–400)
RBC: 3.07 MIL/uL — ABNORMAL LOW (ref 3.87–5.11)
RDW: 13.6 % (ref 11.5–15.5)
WBC: 15.4 10*3/uL — AB (ref 4.0–10.5)

## 2016-06-25 MED ORDER — IPRATROPIUM-ALBUTEROL 0.5-2.5 (3) MG/3ML IN SOLN
3.0000 mL | Freq: Three times a day (TID) | RESPIRATORY_TRACT | Status: DC
  Administered 2016-06-25 – 2016-06-26 (×2): 3 mL via RESPIRATORY_TRACT
  Filled 2016-06-25 (×3): qty 3

## 2016-06-25 NOTE — Progress Notes (Signed)
Pt arrived floor in the company of family members and a NT. Pt was settled in the room and telemetry and suction were initiated. Will continue to monitor.

## 2016-06-25 NOTE — Progress Notes (Signed)
Patient ID: Denise Adams, female   DOB: Jun 09, 1931, 83 y.o.   MRN: [id number]  PROGRESS NOTE    ILYNN STAUFFER  SFS:[id number] DOB: 26-Jun-1931 DOA: 06/19/2016  PCP: Tivis Ringer, MD   Brief Narrative:  83 y.o. female with medical history significant for hypertension, depression, tobacco abuse, COPD, peripheral vascular disease. Patient presented to [facility] with worsening shortness of breath and associated cough productive of yellowish sputum for past 1 week prior to this admission. She smokes about 1-1/2 to one pack of cigarettes per day on average. Patient tried Mucinex over-the-counter which has caused her to have a dry cough for past 2 days. She also reported associated wheezing. She uses home inhalers including Symbicort and albuterol without significant symptomatic relief. No chest pain. No fevers. In ED, patient was afebrile, heart rate up to 138, respirations 2133, oxygen saturation 91% on 2 L nasal cannula oxygen support. Blood work demonstrated white blood cell count of 29, hemoglobin 11.3, sodium 1:30. Chest x-ray showed a right mid and lower lung opacities. Patient was given Levaquin in the ED and then was placed on vancomycin and cefepime while awaiting culture results.  Assessment & Plan:   Principal Problem: Sepsis secondary to lobar pneumonia / leukocytosis / lactic acidosis - Sepsis criteria met on the admission with tachycardia, tachypnea, hypoxia, hypotension, leukocytosis and lactic acidosis. Pneumonia is likely source of infection. - Chest x-ray on the admission showed opacities in the right mid and lower lung lobe - Stopped vanco 3/13 - Continue cefepime until the discharge, will switch to Levaquin on discharge  - Blood cx are negative to date  - Lactic acid WNL  Active Problems: Troponin elevation - Demand ischemia from sepsis and pneumonia - Troponin level flat, 0.032 - Last 2-D echo in 2013 showed ejection fraction 55% and grade 1 diastolic  dysfunction  Acute COPD exacerbation / acute respiratory failure with hypoxia - Continue duoneb every 4 hours scheduled and every 2 hours as needed for shortness of breath - Continue Pulmicort and Brovana nebulizer twice daily - Started prednisone PO today, stopped solumedrol, last dose 3/17 - Continue oxygen support via nasal cannula to keep oxygen saturation above 90%  Normocytic anemia - Hemoglobin on the admission 11.3 and repeat hemoglobin 8.8 - Hgb stable overall   Hyponatremia - Likely in the setting of acute infectious process, pneumonia, sepsis, possibly dehydration versus HCTZ - Stopped hctz on 3/13 - Sodium stable   Hypokalemia - Due to hctz - Supplemented, WNL   Restless leg syndrome - Continue Requip  Depression and anxiety - Continue Ativan and Lexapro  Tobacco abuse - Counseled on smoking cessation - Continue nicotine patch  Severe protein calorie malnutrition / Underweight  - In the context of acute illness - Continue nutritional supplementation      DVT prophylaxis: SCDs and aspirin  Code Status: full code  Family Communication: family not at the bedside this am  Disposition Plan: home tomorrow; [facility] PT orders placed    Consultants:   PT  Nutrition   Procedures:   None   Antimicrobials:   Vanco 06/19/2016 --> 06/20/2016  Cefepime 06/19/2016 -->   Subjective: No overnight events.   Objective: Vitals:   06/24/16 1600 06/24/16 2005 06/24/16 2100 06/25/16 0518  BP: (!) 146/58 (!) 119/47 (!) 114/56 (!) 124/55  Pulse: 83 71 77 66  Resp: 19 (!) _0 Temp: 98.5 F (36.9 C) 98.7 F (37.1 C) 98.2 F (36.8 C) 98.3 F (36.8 C)  TempSrc:  Oral Oral Oral Oral  SpO2: 98% 94% 91% 93%  Weight:      Height:        Intake/Output Summary (Last 24 hours) at 06/25/16 1154 Last data filed at 06/24/16 1716  Gross per 24 hour  Intake              480 ml  Output              600 ml  Net             -120 ml   Filed Weights   06/19/16 2215  06/19/16 2300 06/21/16 0400  Weight: 42.1 kg (92 lb 13 oz) 42.6 kg (93 lb 14.7 oz) 44.1 kg (97 lb 3.6 oz)    Examination:  General exam: calm, no distress  Respiratory system: rhonchoros but no wheezing  Cardiovascular system: S1 & S2 heard, Rate controlled  Gastrointestinal system: (+) BS, no distention, non tender  Central nervous system: Nonfocal  Extremities: No edema, palpable pulses Skin: no lesions, no ulcers  Psychiatry: no agitation or restlessness   Data Reviewed: I have personally reviewed following labs and imaging studies  CBC:  Recent Labs Lab 06/19/16 1920  06/21/16 0255 06/22/16 0314 06/23/16 0238 06/24/16 0350 06/25/16 0538  WBC 29.0*  < > 29.4* 17.2* 12.8* 12.3* 15.4*  NEUTROABS 25.8*  --   --   --   --   --   --   HGB 11.3*  < > 9.0* 9.1* 9.4* 9.4* 9.8*  HCT 33.9*  < > 27.5* 27.9* 28.6* 28.9* 30.8*  MCV 98.0  < > 96.5 97.9 98.6 100.0 100.3*  PLT 378  < > 309 323 297 331 348  < > = values in this interval not displayed. Basic Metabolic Panel:  Recent Labs Lab 06/21/16 0255 06/22/16 0314 06/23/16 0238 06/24/16 0350 06/25/16 0538  NA 131* 129* 133* 133* 132*  K 4.7 4.7 4.6 4.0 4.2  CL 96* 94* 92* 92* 91*  CO2 28 31 34* 36* 38*  GLUCOSE 94 83 146* 105* 130*  BUN _0 CREATININE 0.50 0.55 0.52 0.52 0.58  CALCIUM 8.8* 8.7* 9.1 8.8* 8.7*   GFR: Estimated Creatinine Clearance: 37.7 mL/min (by C-G formula based on SCr of 0.58 mg/dL). Liver Function Tests:  Recent Labs Lab 06/19/16 1920  AST 30  ALT 19  ALKPHOS 83  BILITOT 0.9  PROT 5.7*  ALBUMIN 3.2*   No results for input(s): LIPASE, AMYLASE in the last 168 hours. No results for input(s): AMMONIA in the last 168 hours. Coagulation Profile: No results for input(s): INR, PROTIME in the last 168 hours. Cardiac Enzymes:  Recent Labs Lab 06/20/16 0037 06/20/16 0248  TROPONINI 0.03* 0.03*   BNP (last 3 results) No results for input(s): PROBNP in the last 8760  hours. HbA1C: No results for input(s): HGBA1C in the last 72 hours. CBG:  Recent Labs Lab 06/23/16 1600  GLUCAP 255*   Lipid Profile: No results for input(s): CHOL, HDL, LDLCALC, TRIG, CHOLHDL, LDLDIRECT in the last 72 hours. Thyroid Function Tests: No results for input(s): TSH, T4TOTAL, FREET4, T3FREE, THYROIDAB in the last 72 hours. Anemia Panel: No results for input(s): VITAMINB12, FOLATE, FERRITIN, TIBC, IRON, RETICCTPCT in the last 72 hours. Urine analysis:    Component Value Date/Time   COLORURINE YELLOW 06/20/2016 0005   APPEARANCEUR CLEAR 06/20/2016 0005   LABSPEC 1.013 06/20/2016 0005   PHURINE 5.0 06/20/2016 0005   GLUCOSEU >=500 (A) 06/20/2016 0005  HGBUR NEGATIVE 06/20/2016 0005   BILIRUBINUR NEGATIVE 06/20/2016 0005   KETONESUR 5 (A) 06/20/2016 0005   PROTEINUR NEGATIVE 06/20/2016 0005   UROBILINOGEN 0.2 05/25/2009 1224   NITRITE NEGATIVE 06/20/2016 0005   LEUKOCYTESUR NEGATIVE 06/20/2016 0005   Sepsis Labs: _0 (procalcitonin:4,lacticidven:4)    Results for orders placed or performed during the hospital encounter of 06/19/16  Blood culture (routine x 2)     Status: None   Collection Time: 06/19/16  8:55 PM  Result Value Ref Range Status   Specimen Description LEFT ANTECUBITAL  Final   Special Requests BOTTLES DRAWN AEROBIC AND ANAEROBIC 5CC  Final   Culture NO GROWTH 5 DAYS  Final   Report Status 06/24/2016 FINAL  Final  Blood culture (routine x 2)     Status: None   Collection Time: 06/19/16  9:04 PM  Result Value Ref Range Status   Specimen Description BLOOD  Final   Special Requests NONE  Final   Culture NO GROWTH 5 DAYS  Final   Report Status 06/24/2016 FINAL  Final  MRSA PCR Screening     Status: None   Collection Time: 06/19/16 11:00 PM  Result Value Ref Range Status   MRSA by PCR NEGATIVE NEGATIVE Final    Comment:        The GeneXpert MRSA Assay (FDA approved for NASAL specimens only), is one component of a comprehensive MRSA  colonization surveillance program. It is not intended to diagnose MRSA infection nor to guide or monitor treatment for MRSA infections.      Radiology Studies: Dg Chest Port 1 View Result Date: 06/19/2016 Heterogeneous opacities right mid and lower lung may represent pneumonia in the appropriate clinical setting. Followup PA and lateral chest X-ray is recommended in 3-4 weeks following trial of antibiotic therapy to ensure resolution and exclude underlying malignancy. Nonspecific left-greater-than-right apical pleuroparenchymal thickening.   Marland Kitchen arformoterol  15 mcg Nebulization BID  . aspirin EC  81 mg Oral Daily  . budesonide (PULMICORT) nebulizer solution  0.5 mg Nebulization BID  . ceFEPime (MAXIPIME) IV  2 g Intravenous Q24H  . escitalopram  5 mg Oral QHS  . feeding supplement (ENSURE ENLIVE)  237 mL Oral BID BM  . guaiFENesin  600 mg Oral BID  . ipratropium-albutero  3 mL Nebulization QID  . LORazepam  0.5 mg Oral Q8H  . methylPREDNISolo  40 mg Intravenous Daily  . rOPINIRole  0.5 mg Oral QHS    Continuous Infusions:   LOS: 6 days    Time spent: 15 minutes  Greater than 50% of the time spent on counseling and coordinating the care.   Leisa Lenz, MD [facility] Pager [phone]  If 7PM-7AM, please contact night-coverage [email or web address] Password [facility] 06/25/2016, 11:54 AM

## 2016-06-26 MED ORDER — ARFORMOTEROL TARTRATE 15 MCG/2ML IN NEBU: 15.0000 ug | INHALATION_SOLUTION | Freq: Two times a day (BID) | RESPIRATORY_TRACT | 0 refills | Status: DC

## 2016-06-26 MED ORDER — GUAIFENESIN ER 600 MG PO TB12: 600.0000 mg | ORAL_TABLET | Freq: Two times a day (BID) | ORAL | 0 refills | Status: DC | PRN

## 2016-06-26 MED ORDER — BUDESONIDE 0.5 MG/2ML IN SUSP: 0.5000 mg | Freq: Two times a day (BID) | RESPIRATORY_TRACT | 0 refills | Status: DC

## 2016-06-26 MED ORDER — PREDNISONE 50 MG PO TABS: 50.0000 mg | ORAL_TABLET | Freq: Every day | ORAL | 0 refills | Status: AC

## 2016-06-26 MED ORDER — SENNA 8.6 MG PO TABS
1.0000 | ORAL_TABLET | Freq: Every day | ORAL | Status: DC
  Administered 2016-06-26: 8.6 mg via ORAL
  Filled 2016-06-26: qty 1

## 2016-06-26 MED ORDER — ACETAMINOPHEN 325 MG PO TABS: 650.0000 mg | ORAL_TABLET | Freq: Four times a day (QID) | ORAL | 0 refills | Status: DC | PRN

## 2016-06-26 MED ORDER — POLYETHYLENE GLYCOL 3350 17 G PO PACK
17.0000 g | PACK | Freq: Every day | ORAL | Status: DC
  Administered 2016-06-26: 17 g via ORAL
  Filled 2016-06-26: qty 1

## 2016-06-26 MED ORDER — ROPINIROLE HCL 0.5 MG PO TABS: 0.5000 mg | ORAL_TABLET | Freq: Every day | ORAL | 0 refills | Status: AC

## 2016-06-26 MED ORDER — IPRATROPIUM-ALBUTEROL 0.5-2.5 (3) MG/3ML IN SOLN: 3.0000 mL | RESPIRATORY_TRACT | 0 refills | Status: AC | PRN

## 2016-06-26 MED ORDER — ENSURE ENLIVE PO LIQD: 237.0000 mL | Freq: Two times a day (BID) | ORAL | 0 refills | Status: DC

## 2016-06-26 NOTE — Progress Notes (Addendum)
Hospital bed will be delivered to home [facility](  [address] [phone] )   prior to discharge. Rolling walker will be delivered to bedside prior to discharge and home oxygen arranged. CM to make pt aware, and noted on AVS.  [facility] [phone], [id number] Gae GallopAngela Dquan Cortopassi RN,BSN, CM

## 2016-06-26 NOTE — Discharge Instructions (Signed)

## 2016-06-26 NOTE — Discharge Summary (Signed)
Physician Discharge Summary  Denise Adams PIR:[id number] DOB: [birth year] DOA: 06/19/2016  PCP: Tivis Ringer, MD  Admit date: 06/19/2016 Discharge date: 06/26/2016  Recommendations for Outpatient Follow-up:  Please avoid narcotics and benzos.  Continue prednisone for 3 days on discharge.  Discharge Diagnoses:  Principal Problem:   Sepsis ([place]) Active Problems:   HLD (hyperlipidemia)   PVD (peripheral vascular disease) (HCC)   Tobacco abuse   Abdominal aortic aneurysm (HCC)   HCAP (healthcare-associated pneumonia)   Anxiety state   COPD exacerbation (HCC)   Protein-calorie malnutrition, severe    Discharge Condition: stable   Diet recommendation: as tolerated   History of present illness:  83 y.o.femalewith medical history significant for hypertension, depression, tobacco abuse, COPD, peripheral vascular disease. Patient presented to [facility] with worsening shortness of breath and associated cough productive of yellowish sputum for past 1 week prior to this admission. She smokes about 1-1/2 to one pack of cigarettes per day on average. Patient tried Mucinex over-the-counter which has caused her to have a dry cough for past 2 days. She also reported associated wheezing. She uses home inhalers including Symbicort and albuterol without significant symptomatic relief. No chest pain. No fevers. In ED, patient was afebrile, heart rate up to 138, respirations 2133, oxygen saturation 91% on 2 L nasal cannula oxygen support. Blood work demonstrated white blood cell count of 29, hemoglobin 11.3, sodium 1:30. Chest x-ray showed a right mid and lower lung opacities. Patient was given Levaquin in the ED and then was placed on vancomycin and cefepime while awaiting culture results.  Hospital Course:   Principal Problem: Sepsis secondary to lobar pneumonia / leukocytosis / lactic acidosis - Sepsis criteria met on the admission with tachycardia, tachypnea, hypoxia, hypotension,  leukocytosis and lactic acidosis. Pneumonia is likely source of infection. - Chest x-ray on the admission showed opacities in the right mid and lower lung lobe - Stopped vanco 3/13 - Stop cefepime today, has completed 7 days on cefepime  - Continue cefepime until the discharge, will switch to Levaquin on discharge  - Blood cx are negative to date  - Lactic acid WNL  Active Problems: Troponin elevation - Demand ischemia from sepsis and pneumonia - Troponin level flat, 0.032 - Last 2-D echo in 2013 showed ejection fraction 55% and grade 1 diastolic dysfunction  Acute COPD exacerbation / acute respiratory failure with hypoxia - Continue duoneb every 4 hours scheduled and every 2 hours as needed for shortness of breath - Continue Pulmicort and Brovana nebulizer twice daily - Continue prednisone for  3 more days on discharge   Normocytic anemia - Hemoglobin on the admission 11.3 and repeat hemoglobin 8.8 - Hgb stable overall   Hyponatremia - Likely in the setting of acute infectious process, pneumonia, sepsis, possibly dehydration versus HCTZ - Stopped hctz on 3/13 - Sodium stable   Hypokalemia - Due to hctz - Supplemented, WNL   Restless leg syndrome - Continue Requip  Depression and anxiety - Continue Lexapro, would avoid narcotics and benzos   Tobacco abuse - Counseled on smoking cessation - Nicotine patch in hospital   Severe protein calorie malnutrition / Underweight  - In the context of acute illness - Continue nutritional supplementation      DVT prophylaxis: SCDs and aspirin  Code Status: full code  Family Communication: daughter at the bedside this am; spoke with another daughter over the phone this am    Consultants:   PT  Nutrition   Procedures:   None  Antimicrobials:   Vanco 06/19/2016 --> 06/20/2016  Cefepime 06/19/2016 --> 06/26/2016  Signed:  Leisa Lenz, MD  [facility] 06/26/2016, 10:04 AM  Pager #:  [phone]  Time spent in minutes: less than 30 minutes    Discharge Exam: Vitals:   06/25/16 2200 06/26/16 0516  BP: (!) 142/61 (!) 146/55  Pulse: (!) 103 82  Resp: 18 18  Temp: 97.9 F (36.6 C) 98.3 F (36.8 C)   Vitals:   06/25/16 1959 06/25/16 2200 06/26/16 0516 06/26/16 0842  BP:  (!) 142/61 (!) 146/55   Pulse:  (!) 103 82   Resp:  18 18   Temp:  97.9 F (36.6 C) 98.3 F (36.8 C)   TempSrc:  Oral Oral   SpO2: 93% 94% 91% 93%  Weight:      Height:        General: Pt is alert, follows commands appropriately, not in acute distress Cardiovascular: Regular rate and rhythm, S1/S2 +, no murmurs Respiratory: Clear to auscultation bilaterally, no wheezing, no crackles, no rhonchi Abdominal: Soft, non tender, non distended, bowel sounds +, no guarding Extremities: no edema, no cyanosis, pulses palpable bilaterally DP and PT Neuro: Grossly nonfocal  Discharge Instructions  Discharge Instructions    Call MD for:  persistant nausea and vomiting    Complete by:  As directed    Call MD for:  redness, tenderness, or signs of infection (pain, swelling, redness, odor or green/yellow discharge around incision site)    Complete by:  As directed    Call MD for:  severe uncontrolled pain    Complete by:  As directed    Diet - low sodium heart healthy    Complete by:  As directed    Discharge instructions    Complete by:  As directed    Please avoid narcotics and benzos.  Continue prednisone for 3 days on discharge.   Increase activity slowly    Complete by:  As directed      Allergies as of 06/26/2016   No Known Allergies     Medication List    STOP taking these medications   hydrochlorothiazide 25 MG tablet Commonly known as:  HYDRODIURIL   HYDROcodone-acetaminophen 7.5-325 MG tablet Commonly known as:  NORCO   LORazepam 0.5 MG tablet Commonly known as:  ATIVAN     TAKE these medications   acetaminophen 325 MG tablet Commonly known as:  TYLENOL Take 2  tablets (650 mg total) by mouth every 6 (six) hours as needed for mild pain (or Fever >/= 101).   albuterol 108 (90 Base) MCG/ACT inhaler Commonly known as:  PROVENTIL HFA;VENTOLIN HFA Inhale 2 puffs into the lungs every 6 (six) hours as needed for shortness of breath.   arformoterol 15 MCG/2ML Nebu Commonly known as:  BROVANA Take 2 mLs (15 mcg total) by nebulization 2 (two) times daily.   aspirin EC 81 MG tablet Take 81 mg by mouth daily.   budesonide 0.5 MG/2ML nebulizer solution Commonly known as:  PULMICORT Take 2 mLs (0.5 mg total) by nebulization 2 (two) times daily.   budesonide-formoterol 160-4.5 MCG/ACT inhaler Commonly known as:  SYMBICORT Inhale 2 puffs into the lungs 2 (two) times daily.   cyanocobalamin 1000 MCG tablet Take 1,000 mcg by mouth daily. Reported on 09/21/2015   escitalopram 5 MG tablet Commonly known as:  LEXAPRO Take 5 mg by mouth at bedtime.   feeding supplement (ENSURE ENLIVE) Liqd Take 237 mLs by mouth 2 (two) times daily between meals.  guaiFENesin 600 MG 12 hr tablet Commonly known as:  MUCINEX Take 1 tablet (600 mg total) by mouth 2 (two) times daily as needed.   ipratropium-albuterol 0.5-2.5 (3) MG/3ML Soln Commonly known as:  DUONEB Take 3 mLs by nebulization every 4 (four) hours as needed.   Krill Oil 1000 MG Caps Take 1,000 mg by mouth daily.   methocarbamol 500 MG tablet Commonly known as:  ROBAXIN Take 1 tablet (500 mg total) by mouth every 8 (eight) hours as needed for muscle spasms.   predniSONE 50 MG tablet Commonly known as:  DELTASONE Take 1 tablet (50 mg total) by mouth daily with breakfast.   PRESERVISION AREDS PO Take 1 tablet by mouth 2 (two) times daily.   rOPINIRole 0.5 MG tablet Commonly known as:  REQUIP Take 1 tablet (0.5 mg total) by mouth at bedtime.   Vitamin D 2000 units Caps Take 2,000 Units by mouth daily.            Durable Medical Equipment        Start     Ordered   06/26/16 [phone]  For  home use only DME Hospital bed  Once    Question Answer Comment  Patient has (list medical condition): weakness, generalzied pain   The above medical condition requires: Patient requires the ability to reposition frequently   Head must be elevated greater than: 30 degrees   Bed type Semi-electric      06/26/16 0951     Follow-up Information    [place] Follow up.   Specialty:  Home Health Services Why:  For [facility] RN PT OT HHA. They will call 1-2 days after DC to set up first [facility] visit.  Contact information: [address] [place] [address] [phone]        Tivis Ringer, MD. Schedule an appointment as soon as possible for a visit in 1 week(s).   Specialty:  Internal Medicine Contact information: [place] [address] [phone]        Rigoberto Noel., MD. Schedule an appointment as soon as possible for a visit.   Specialty:  Pulmonary Disease Contact information: [address] [place] [address] [phone]            The results of significant diagnostics from this hospitalization (including imaging, microbiology, ancillary and laboratory) are listed below for reference.    Significant Diagnostic Studies: Dg Chest Port 1 View  Result Date: 06/19/2016 CLINICAL DATA:  Patient with history of shortness of breath for 1 week. EXAM: PORTABLE CHEST 1 VIEW COMPARISON:  Chest radiograph 05/12/2016. FINDINGS: Monitoring leads overlie the patient. Normal cardiac and mediastinal contours. Aortic vascular calcifications. Heterogeneous opacities within the right mid lower lung. Left-greater-than-right apical pleuroparenchymal thickening. IMPRESSION: Heterogeneous opacities right mid and lower lung may represent pneumonia in the appropriate clinical setting. Followup PA and lateral chest X-ray is recommended in 3-4 weeks following trial of antibiotic therapy to ensure resolution and exclude underlying malignancy.  Nonspecific left-greater-than-right apical pleuroparenchymal thickening. Electronically Signed   By: Lovey Newcomer M.D.   On: 06/19/2016 19:59    Microbiology: Recent Results (from the past 240 hour(s))  Blood culture (routine x 2)     Status: None   Collection Time: 06/19/16  8:55 PM  Result Value Ref Range Status   Specimen Description LEFT ANTECUBITAL  Final   Special Requests BOTTLES DRAWN AEROBIC AND ANAEROBIC 5CC  Final   Culture NO GROWTH 5 DAYS  Final   Report Status 06/24/2016 FINAL  Final  Blood culture (routine x 2)     Status: None   Collection Time: 06/19/16  9:04 PM  Result Value Ref Range Status   Specimen Description BLOOD  Final   Special Requests NONE  Final   Culture NO GROWTH 5 DAYS  Final   Report Status 06/24/2016 FINAL  Final  MRSA PCR Screening     Status: None   Collection Time: 06/19/16 11:00 PM  Result Value Ref Range Status   MRSA by PCR NEGATIVE NEGATIVE Final     Labs: Basic Metabolic Panel:  Recent Labs Lab 06/21/16 0255 06/22/16 0314 06/23/16 0238 06/24/16 0350 06/25/16 0538  NA 131* 129* 133* 133* 132*  K 4.7 4.7 4.6 4.0 4.2  CL 96* 94* 92* 92* 91*  CO2 28 31 34* 36* 38*  GLUCOSE 94 83 146* 105* 130*  BUN '8 8 15 18 18  ' CREATININE 0.50 0.55 0.52 0.52 0.58  CALCIUM 8.8* 8.7* 9.1 8.8* 8.7*   Liver Function Tests:  Recent Labs Lab 06/19/16 1920  AST 30  ALT 19  ALKPHOS 83  BILITOT 0.9  PROT 5.7*  ALBUMIN 3.2*   No results for input(s): LIPASE, AMYLASE in the last 168 hours. No results for input(s): AMMONIA in the last 168 hours. CBC:  Recent Labs Lab 06/19/16 1920  06/21/16 0255 06/22/16 0314 06/23/16 0238 06/24/16 0350 06/25/16 0538  WBC 29.0*  < > 29.4* 17.2* 12.8* 12.3* 15.4*  NEUTROABS 25.8*  --   --   --   --   --   --   HGB 11.3*  < > 9.0* 9.1* 9.4* 9.4* 9.8*  HCT 33.9*  < > 27.5* 27.9* 28.6* 28.9* 30.8*  MCV 98.0  < > 96.5 97.9 98.6 100.0 100.3*  PLT 378  < > 309 323 297 331 348  < > = values in this interval  not displayed. Cardiac Enzymes:  Recent Labs Lab 06/20/16 0037 06/20/16 0248  TROPONINI 0.03* 0.03*   BNP: BNP (last 3 results)  Recent Labs  05/11/16 1621 06/19/16 1920  BNP 439.1* 285.3*    ProBNP (last 3 results) No results for input(s): PROBNP in the last 8760 hours.  CBG:  Recent Labs Lab 06/23/16 1600  GLUCAP 255*

## 2016-06-26 NOTE — Care Management Note (Signed)
Case Management Note  Patient Details  Name: Denise Adams MRN: [id number] Date of Birth: [birth year]  Subjective/Objective:   Admitted with sepsis/ COPD exacerbation.        Action/Plan: Plan is to d/c to home today with home health services  and oxygen.  Expected Discharge Date:  06/26/16               Expected Discharge Plan:  Home w Home Health Services  In-House Referral:     Discharge planning Services  CM Consult  Post Acute Care Choice:    Choice offered to:  Patient  DME Arranged:  Oxygen, Walker rolling, Hospital bed, 3-N-1 DME Agency:  [facility]  HH Arranged:  RN, PT, OT, [facility]([place], [phone])  Status of Service:  Completed, signed off  If discussed at Long Length of Stay Meetings, dates discussed:    Additional Comments:  Epifanio LeschesCole, Willard Farquharson Hudson, RN 06/26/2016, 11:19 AM

## 2016-06-26 NOTE — Care Management Important Message (Signed)
Important Message  Patient Details  Name: Rennis ChrisKatherine M Terwilliger MRN: [id number] Date of Birth: [birth year]   Medicare Important Message Given:  Yes    Kyla BalzarineShealy, Hervey Wedig Abena 06/26/2016, 1:05 PM

## 2016-06-26 NOTE — Progress Notes (Signed)
SATURATION QUALIFICATIONS: (This note is used to comply with regulatory documentation for home oxygen)  Patient Saturations on Room Air at Rest = 88%      

## 2016-08-03 ENCOUNTER — Telehealth: Payer: Self-pay | Admitting: Cardiology

## 2016-08-03 NOTE — Telephone Encounter (Signed)
Received records from [facility] for appointment on 10/02/16 with Dr Jens Som.  Records put with Dr Ludwig Clarks schedule for 10/02/16. lp

## 2016-08-19 ENCOUNTER — Inpatient Hospital Stay (HOSPITAL_COMMUNITY)
Admission: EM | Admit: 2016-08-19 | Discharge: 2016-08-24 | DRG: 208 | Disposition: A | Discharge status: Home / Self Care | Payer: Medicare Other | Attending: Family Medicine | Admitting: Family Medicine

## 2016-08-19 DIAGNOSIS — F17210 Nicotine dependence, cigarettes, uncomplicated: Secondary | ICD-10-CM | POA: Diagnosis present

## 2016-08-19 DIAGNOSIS — Z681 Body mass index (BMI) 19 or less, adult: Secondary | ICD-10-CM

## 2016-08-19 DIAGNOSIS — R531 Weakness: Secondary | ICD-10-CM

## 2016-08-19 DIAGNOSIS — Z90710 Acquired absence of both cervix and uterus: Secondary | ICD-10-CM

## 2016-08-19 DIAGNOSIS — I5033 Acute on chronic diastolic (congestive) heart failure: Secondary | ICD-10-CM | POA: Diagnosis present

## 2016-08-19 DIAGNOSIS — Z9981 Dependence on supplemental oxygen: Secondary | ICD-10-CM

## 2016-08-19 DIAGNOSIS — Z7982 Long term (current) use of aspirin: Secondary | ICD-10-CM

## 2016-08-19 DIAGNOSIS — J441 Chronic obstructive pulmonary disease with (acute) exacerbation: Secondary | ICD-10-CM

## 2016-08-19 DIAGNOSIS — R000 Tachycardia, unspecified: Secondary | ICD-10-CM | POA: Diagnosis present

## 2016-08-19 DIAGNOSIS — Z515 Encounter for palliative care: Secondary | ICD-10-CM

## 2016-08-19 DIAGNOSIS — Z9289 Personal history of other medical treatment: Secondary | ICD-10-CM

## 2016-08-19 DIAGNOSIS — I350 Nonrheumatic aortic (valve) stenosis: Secondary | ICD-10-CM | POA: Diagnosis present

## 2016-08-19 DIAGNOSIS — J9602 Acute respiratory failure with hypercapnia: Secondary | ICD-10-CM | POA: Diagnosis present

## 2016-08-19 DIAGNOSIS — D649 Anemia, unspecified: Secondary | ICD-10-CM | POA: Diagnosis present

## 2016-08-19 DIAGNOSIS — E43 Unspecified severe protein-calorie malnutrition: Secondary | ICD-10-CM

## 2016-08-19 DIAGNOSIS — R296 Repeated falls: Secondary | ICD-10-CM | POA: Diagnosis present

## 2016-08-19 DIAGNOSIS — Z7951 Long term (current) use of inhaled steroids: Secondary | ICD-10-CM

## 2016-08-19 DIAGNOSIS — J9601 Acute respiratory failure with hypoxia: Secondary | ICD-10-CM

## 2016-08-19 DIAGNOSIS — R54 Age-related physical debility: Secondary | ICD-10-CM | POA: Diagnosis present

## 2016-08-19 DIAGNOSIS — I110 Hypertensive heart disease with heart failure: Secondary | ICD-10-CM | POA: Diagnosis present

## 2016-08-19 DIAGNOSIS — R0602 Shortness of breath: Secondary | ICD-10-CM | POA: Diagnosis not present

## 2016-08-19 DIAGNOSIS — Z8249 Family history of ischemic heart disease and other diseases of the circulatory system: Secondary | ICD-10-CM

## 2016-08-19 DIAGNOSIS — Z79899 Other long term (current) drug therapy: Secondary | ICD-10-CM

## 2016-08-19 DIAGNOSIS — J9690 Respiratory failure, unspecified, unspecified whether with hypoxia or hypercapnia: Secondary | ICD-10-CM | POA: Diagnosis present

## 2016-08-19 DIAGNOSIS — E1151 Type 2 diabetes mellitus with diabetic peripheral angiopathy without gangrene: Secondary | ICD-10-CM | POA: Diagnosis present

## 2016-08-19 DIAGNOSIS — N179 Acute kidney failure, unspecified: Secondary | ICD-10-CM | POA: Diagnosis present

## 2016-08-19 DIAGNOSIS — Z7189 Other specified counseling: Secondary | ICD-10-CM

## 2016-08-19 DIAGNOSIS — Z9849 Cataract extraction status, unspecified eye: Secondary | ICD-10-CM

## 2016-08-19 DIAGNOSIS — A419 Sepsis, unspecified organism: Secondary | ICD-10-CM

## 2016-08-19 DIAGNOSIS — I252 Old myocardial infarction: Secondary | ICD-10-CM

## 2016-08-19 DIAGNOSIS — I4892 Unspecified atrial flutter: Secondary | ICD-10-CM | POA: Diagnosis present

## 2016-08-19 DIAGNOSIS — R627 Adult failure to thrive: Secondary | ICD-10-CM | POA: Diagnosis present

## 2016-08-19 DIAGNOSIS — E1165 Type 2 diabetes mellitus with hyperglycemia: Secondary | ICD-10-CM | POA: Diagnosis present

## 2016-08-19 DIAGNOSIS — J9600 Acute respiratory failure, unspecified whether with hypoxia or hypercapnia: Secondary | ICD-10-CM

## 2016-08-19 DIAGNOSIS — E785 Hyperlipidemia, unspecified: Secondary | ICD-10-CM | POA: Diagnosis present

## 2016-08-19 DIAGNOSIS — J189 Pneumonia, unspecified organism: Secondary | ICD-10-CM

## 2016-08-19 DIAGNOSIS — I480 Paroxysmal atrial fibrillation: Secondary | ICD-10-CM | POA: Diagnosis present

## 2016-08-19 DIAGNOSIS — I4901 Ventricular fibrillation: Secondary | ICD-10-CM | POA: Diagnosis present

## 2016-08-19 DIAGNOSIS — I248 Other forms of acute ischemic heart disease: Secondary | ICD-10-CM | POA: Diagnosis present

## 2016-08-19 NOTE — ED Provider Notes (Signed)
[facility]-EMERGENCY DEPT Provider Note   CSN: [id number] Arrival date & time: 08/19/16  2358  By signing my name below, I, Denise Adams, attest that this documentation has been prepared under the direction and in the presence of physician practitioner, Azalia Bilisampos, Elmore Hyslop, MD. Electronically Signed: Linna Darnerussell Adams, Scribe. 08/20/2016. 12:12 AM.  History   Chief Complaint Chief Complaint  Patient presents with  . Shortness of Breath   The history is provided by the EMS personnel. No language interpreter was used.   HPI Comments: LEVEL 5 CAVEAT FOR ACUITY OF CONDITION Denise Adams is a 83 y.o. female with PMHx including COPD, MI, DM, HTN, and HLD who presents to the Emergency Department via EMS complaining of constant, severe dyspnea beginning shortly PTA. Per EMS, patient is supposed to be on 2L O2 at home continuously but did not use it throughout the day today. Per a family member, patient became confused several hours ago and then suddenly developed significant dyspnea shortly PTA that has worsened. There are no other complaints noted at this time.   Past Medical History:  Diagnosis Date  . Aortic stenosis   . COPD (chronic obstructive pulmonary disease) (HCC)   . Diabetes mellitus   . HLD (hyperlipidemia)   . HTN (hypertension)   . Myocardial infarction   . PVD (peripheral vascular disease) [facility](HCC)     Patient Active Problem List   Diagnosis Date Noted  . Anxiety state 06/20/2016  . COPD exacerbation (HCC) 06/20/2016  . Sepsis (HCC) 06/20/2016  . Protein-calorie malnutrition, severe 06/20/2016  . HCAP (healthcare-associated pneumonia) 06/19/2016  . Viral syndrome 05/11/2016  . Hypothyroidism, acquired 05/11/2016  . Hyponatremia 05/11/2016  . Elevated LFTs 05/11/2016  . Carotid artery stenosis 09/21/2015  . Occlusion and stenosis of carotid artery without mention of cerebral infarction 09/10/2012  . Abdominal aortic aneurysm (HCC) 08/23/2010  . Nonspecific abnormal  unspecified cardiovascular function study 08/23/2010  . Aortic stenosis 06/30/2010  . Shortness of breath dyspnea 06/30/2010  . Abdominal bruit 06/30/2010  . Tobacco abuse 06/30/2010  . Essential hypertension   . HLD (hyperlipidemia)   . Diabetes mellitus   . PVD (peripheral vascular disease) (HCC)     Past Surgical History:  Procedure Laterality Date  . ABDOMINAL HYSTERECTOMY    . APPENDECTOMY    . CAROTID ENDARTERECTOMY     Left  . CATARACT EXTRACTION    . FOOT SURGERY     right  . TONSILLECTOMY      OB History    No data available       Home Medications    Prior to Admission medications   Medication Sig Start Date End Date Taking? Authorizing Provider  acetaminophen (TYLENOL) 325 MG tablet Take 2 tablets (650 mg total) by mouth every 6 (six) hours as needed for mild pain (or Fever >/= 101). 06/26/16   Alison Murrayevine, Alma M, MD  albuterol (PROVENTIL HFA;VENTOLIN HFA) 108 (90 BASE) MCG/ACT inhaler Inhale 2 puffs into the lungs every 6 (six) hours as needed for shortness of breath.     [provider]  arformoterol (BROVANA) 15 MCG/2ML NEBU Take 2 mLs (15 mcg total) by nebulization 2 (two) times daily. 06/26/16   Alison Murrayevine, Alma M, MD  aspirin EC 81 MG tablet Take 81 mg by mouth daily.    [provider]  budesonide (PULMICORT) 0.5 MG/2ML nebulizer solution Take 2 mLs (0.5 mg total) by nebulization 2 (two) times daily. 06/26/16   Alison Murrayevine, Alma M, MD  budesonide-formoterol [facility](SYMBICORT) 160-4.5 MCG/ACT inhaler  Inhale 2 puffs into the lungs 2 (two) times daily. 05/13/16   Rai, Delene Ruffini, MD  Cholecalciferol (VITAMIN D) 2000 UNITS CAPS Take 2,000 Units by mouth daily.     [provider]  cyanocobalamin 1000 MCG tablet Take 1,000 mcg by mouth daily. Reported on 09/21/2015    [provider]  escitalopram (LEXAPRO) 5 MG tablet Take 5 mg by mouth at bedtime.  06/18/16   [provider]  feeding supplement, ENSURE ENLIVE, (ENSURE ENLIVE) LIQD Take 237 mLs  by mouth 2 (two) times daily between meals. 06/26/16   Alison Murray, MD  guaiFENesin (MUCINEX) 600 MG 12 hr tablet Take 1 tablet (600 mg total) by mouth 2 (two) times daily as needed. 06/26/16   Alison Murray, MD  ipratropium-albuterol (DUONEB) 0.5-2.5 (3) MG/3ML SOLN Take 3 mLs by nebulization every 4 (four) hours as needed. 06/26/16   Alison Murray, MD  Krill Oil 1000 MG CAPS Take 1,000 mg by mouth daily.    [provider]  methocarbamol (ROBAXIN) 500 MG tablet Take 1 tablet (500 mg total) by mouth every 8 (eight) hours as needed for muscle spasms. Patient not taking: Reported on 06/19/2016 05/13/16   Rai, Delene Ruffini, MD  Multiple Vitamins-Minerals (PRESERVISION AREDS PO) Take 1 tablet by mouth 2 (two) times daily.     [provider]  rOPINIRole (REQUIP) 0.5 MG tablet Take 1 tablet (0.5 mg total) by mouth at bedtime. 06/26/16   Alison Murray, MD    Family History Family History  Problem Relation Age of Onset  . Coronary artery disease Father   . Coronary artery disease Brother 94  . Coronary artery disease Brother 47    Social History Social History  Substance Use Topics  . Smoking status: Current Every Day Smoker    Packs/day: 0.75    Years: 60.00    Types: Cigarettes  . Smokeless tobacco: Never Used  . Alcohol use No     Allergies   Patient has no known allergies.   Review of Systems Review of Systems  Unable to perform ROS: Acuity of condition   Physical Exam Updated Vital Signs BP (!) 171/85   Pulse (!) 131   Resp (!) 26   Ht 5\' 4"  (1.626 m)   Wt 97 lb (44 kg)   SpO2 (!) 87% Comment: 15L  BMI 16.65 kg/m   Physical Exam  Constitutional: She appears well-developed and well-nourished. No distress.  HENT:  Head: Normocephalic and atraumatic.  Eyes: EOM are normal.  Neck: Normal range of motion.  Cardiovascular: Normal rate, regular rhythm and normal heart sounds.   Pulmonary/Chest: Accessory muscle usage present. Tachypnea noted. She is in  respiratory distress.  Abdominal: Soft. She exhibits no distension. There is no tenderness.  Musculoskeletal: Normal range of motion.  Neurological: She is alert.  Skin: Skin is warm and dry.  Psychiatric: She has a normal mood and affect. Judgment normal.  Nursing note and vitals reviewed.  ED Treatments / Results  Labs (all labs ordered are listed, but only abnormal results are displayed) Labs Reviewed  CBC WITH DIFFERENTIAL/PLATELET - Abnormal; Notable for the following:       Result Value   WBC 20.6 (*)    RBC 3.38 (*)    Hemoglobin 11.1 (*)    HCT 35.8 (*)    MCV 105.9 (*)    Neutro Abs 11.8 (*)    Lymphs Abs 6.4 (*)    Monocytes Absolute 1.6 (*)  Eosinophils Absolute 0.8 (*)    All other components within normal limits  BASIC METABOLIC PANEL - Abnormal; Notable for the following:    Glucose, Bld 159 (*)    GFR calc non Af Amer 52 (*)    All other components within normal limits  BRAIN NATRIURETIC PEPTIDE - Abnormal; Notable for the following:    B Natriuretic Peptide 2,074.8 (*)    All other components within normal limits  TROPONIN I - Abnormal; Notable for the following:    Troponin I 0.11 (*)    All other components within normal limits  I-STAT TROPOININ, ED - Abnormal; Notable for the following:    Troponin i, poc 0.11 (*)    All other components within normal limits  URINE CULTURE  CULTURE, BLOOD (ROUTINE X 2)  CULTURE, BLOOD (ROUTINE X 2)  I-STAT CG4 LACTIC ACID, ED  I-STAT ARTERIAL BLOOD GAS, ED    EKG  EKG Interpretation  Date/Time:  Sunday Aug 20 2016 00:02:00 EDT Ventricular Rate:  132 PR Interval:    QRS Duration: 98 QT Interval:  316 QTC Calculation: 469 R Axis:   67 Text Interpretation:  Sinus tachycardia Anterior infarct, acute (LAD) Baseline wander in lead(s) II III aVF No significant change was found Confirmed by Kiaya Haliburton  MD, Caryn Bee ([id number]) on 08/20/2016 1:20:18 AM       Radiology Dg Chest Port 1 View  Result Date: 08/20/2016 CLINICAL  DATA:  Acute onset of respiratory distress. Endotracheal tube and orogastric tube placement. Initial encounter. EXAM: PORTABLE CHEST 1 VIEW COMPARISON:  Chest radiograph performed 06/19/2016 FINDINGS: The patient's endotracheal tube is seen ending 7 cm above the carina. An enteric tube is noted extending over the body of the stomach. Vascular congestion is noted. Bilateral central airspace opacities raise concern for pulmonary edema. No significant pleural effusion or pneumothorax is seen. The cardiomediastinal silhouette is borderline enlarged. No acute osseous abnormalities are identified. IMPRESSION: 1. Endotracheal tube seen ending 7 cm above the carina. This could be advanced 3 cm, as deemed clinically appropriate. 2. Vascular congestion and borderline cardiomegaly. Bilateral central airspace opacities raise concern for pulmonary edema. Electronically Signed   By: Roanna Raider M.D.   On: 08/20/2016 00:41      ++++++++++++++++++++++++++++++++++++++++++++++++++++  Procedures .Critical Care Performed by: Azalia Bilis Authorized by: Azalia Bilis    Total critical care time: 35 minutes Critical care time was exclusive of separately billable procedures and treating other patients. Critical care was necessary to treat or prevent imminent or life-threatening deterioration. Critical care was time spent personally by me on the following activities: development of treatment plan with patient and/or surrogate as well as nursing, discussions with consultants, evaluation of patient's response to treatment, examination of patient, obtaining history from patient or surrogate, ordering and performing treatments and interventions, ordering and review of laboratory studies, ordering and review of radiographic studies, pulse oximetry and re-evaluation of patient's condition.  INTUBATION Performed by: Lyanne Co Required items: required blood products, implants, devices, and special equipment  available Patient identity confirmed: provided demographic data and hospital-assigned identification number Time out: Immediately prior to procedure a "time out" was called to verify the correct patient, procedure, equipment, support staff and site/side marked as required. Indications: respiratory failure Intubation method: Glidescope Laryngoscopy  Preoxygenation: BVM Sedatives: Etomidate Paralytic: Succinylcholine Tube Size: 7.5 cuffed Post-procedure assessment: chest rise and ETCO2 monitor Breath sounds: equal and absent over the epigastrium Tube secured with: ETT holder Chest x-ray interpreted by radiologist and me. Chest x-ray findings: endotracheal  tube in appropriate position Patient tolerated the procedure well with no immediate complications.   ++++++++++++++++++++++++++++++++++++++++++++++++++++++   DIAGNOSTIC STUDIES: Oxygen Saturation is 87% on 15L O2, low by my interpretation.  Medications Ordered in ED Medications  propofol (DIPRIVAN) 1000 MG/100ML infusion (0 mcg/kg/min  44 kg Intravenous Hold 08/20/16 0116)  propofol (DIPRIVAN) 1000 MG/100ML infusion (  Canceled Entry 08/20/16 0117)  propofol (DIPRIVAN) 1000 MG/100ML infusion (10 mcg/kg/min  44 kg Intravenous New Bag/Given 08/20/16 0040)  sodium chloride 0.9 % bolus 2,000 mL (not administered)  piperacillin-tazobactam (ZOSYN) IVPB 3.375 g (not administered)  vancomycin (VANCOCIN) IVPB 1000 mg/200 mL premix (not administered)  furosemide (LASIX) injection 40 mg (not administered)  etomidate (AMIDATE) injection (20 mg Intravenous Given 08/20/16 0003)  succinylcholine (ANECTINE) injection (100 mg Intravenous Given 08/20/16 0004)     Initial Impression / Assessment and Plan / ED Course  I have reviewed the triage vital signs and the nursing notes.  Pertinent labs & imaging results that were available during my care of the patient were reviewed by me and considered in my medical decision making (see chart for  details).    Patient with hypoxia and shortness of breath for EMS on arrival but then she had a rapid decline while in route.  This is likely respiratory failure with associated flash pulmonary edema.  Patient in extremitis on arrival and intubated for respiratory failure.  Chest x-ray demonstrates pulmonary edema.  Core temperature 99.2.  White count 20,000.  Tachycardic to 140.  Concern for sepsis.  Broad-spectrum antibiotics.  Greater than 30 cc/kg bolus given.  Patient be admitted the ICU.  Ongoing aggressive management while here in the emergency department.  Family updated.  Patient in critical condition    Final Clinical Impressions(s) / ED Diagnoses   Final diagnoses:  Acute respiratory failure with hypoxia (HCC)  Sepsis, due to unspecified organism [facility])    New Prescriptions New Prescriptions   No medications on file     I personally performed the services described in this documentation, which was scribed in my presence. The recorded information has been reviewed and is accurate.       Azalia Bilis, MD 08/20/16 [phone]

## 2016-08-20 ENCOUNTER — Emergency Department (HOSPITAL_COMMUNITY): Payer: Medicare Other

## 2016-08-20 ENCOUNTER — Encounter (HOSPITAL_COMMUNITY): Payer: Self-pay | Admitting: Emergency Medicine

## 2016-08-20 ENCOUNTER — Inpatient Hospital Stay (HOSPITAL_COMMUNITY): Payer: Medicare Other

## 2016-08-20 DIAGNOSIS — R000 Tachycardia, unspecified: Secondary | ICD-10-CM | POA: Diagnosis present

## 2016-08-20 DIAGNOSIS — Z7951 Long term (current) use of inhaled steroids: Secondary | ICD-10-CM | POA: Diagnosis not present

## 2016-08-20 DIAGNOSIS — I4892 Unspecified atrial flutter: Secondary | ICD-10-CM | POA: Diagnosis present

## 2016-08-20 DIAGNOSIS — R0602 Shortness of breath: Secondary | ICD-10-CM | POA: Diagnosis present

## 2016-08-20 DIAGNOSIS — I5033 Acute on chronic diastolic (congestive) heart failure: Secondary | ICD-10-CM | POA: Diagnosis not present

## 2016-08-20 DIAGNOSIS — Z7189 Other specified counseling: Secondary | ICD-10-CM | POA: Diagnosis not present

## 2016-08-20 DIAGNOSIS — F17210 Nicotine dependence, cigarettes, uncomplicated: Secondary | ICD-10-CM | POA: Diagnosis present

## 2016-08-20 DIAGNOSIS — Z90710 Acquired absence of both cervix and uterus: Secondary | ICD-10-CM | POA: Diagnosis not present

## 2016-08-20 DIAGNOSIS — Z515 Encounter for palliative care: Secondary | ICD-10-CM | POA: Diagnosis not present

## 2016-08-20 DIAGNOSIS — N179 Acute kidney failure, unspecified: Secondary | ICD-10-CM | POA: Diagnosis present

## 2016-08-20 DIAGNOSIS — Z79899 Other long term (current) drug therapy: Secondary | ICD-10-CM | POA: Diagnosis not present

## 2016-08-20 DIAGNOSIS — I350 Nonrheumatic aortic (valve) stenosis: Secondary | ICD-10-CM | POA: Diagnosis not present

## 2016-08-20 DIAGNOSIS — E1151 Type 2 diabetes mellitus with diabetic peripheral angiopathy without gangrene: Secondary | ICD-10-CM | POA: Diagnosis present

## 2016-08-20 DIAGNOSIS — J9600 Acute respiratory failure, unspecified whether with hypoxia or hypercapnia: Secondary | ICD-10-CM | POA: Diagnosis present

## 2016-08-20 DIAGNOSIS — I248 Other forms of acute ischemic heart disease: Secondary | ICD-10-CM | POA: Diagnosis present

## 2016-08-20 DIAGNOSIS — Z9981 Dependence on supplemental oxygen: Secondary | ICD-10-CM | POA: Diagnosis not present

## 2016-08-20 DIAGNOSIS — J441 Chronic obstructive pulmonary disease with (acute) exacerbation: Secondary | ICD-10-CM | POA: Diagnosis present

## 2016-08-20 DIAGNOSIS — Z7982 Long term (current) use of aspirin: Secondary | ICD-10-CM | POA: Diagnosis not present

## 2016-08-20 DIAGNOSIS — R0609 Other forms of dyspnea: Secondary | ICD-10-CM | POA: Diagnosis not present

## 2016-08-20 DIAGNOSIS — I360 Nonrheumatic tricuspid (valve) stenosis: Secondary | ICD-10-CM | POA: Diagnosis not present

## 2016-08-20 DIAGNOSIS — J9621 Acute and chronic respiratory failure with hypoxia: Secondary | ICD-10-CM | POA: Diagnosis not present

## 2016-08-20 DIAGNOSIS — I4901 Ventricular fibrillation: Secondary | ICD-10-CM | POA: Diagnosis present

## 2016-08-20 DIAGNOSIS — J9601 Acute respiratory failure with hypoxia: Secondary | ICD-10-CM | POA: Diagnosis not present

## 2016-08-20 DIAGNOSIS — I252 Old myocardial infarction: Secondary | ICD-10-CM | POA: Diagnosis not present

## 2016-08-20 DIAGNOSIS — D649 Anemia, unspecified: Secondary | ICD-10-CM | POA: Diagnosis present

## 2016-08-20 DIAGNOSIS — E785 Hyperlipidemia, unspecified: Secondary | ICD-10-CM | POA: Diagnosis present

## 2016-08-20 DIAGNOSIS — Z9849 Cataract extraction status, unspecified eye: Secondary | ICD-10-CM | POA: Diagnosis not present

## 2016-08-20 DIAGNOSIS — J9690 Respiratory failure, unspecified, unspecified whether with hypoxia or hypercapnia: Secondary | ICD-10-CM | POA: Diagnosis present

## 2016-08-20 DIAGNOSIS — Z681 Body mass index (BMI) 19 or less, adult: Secondary | ICD-10-CM | POA: Diagnosis not present

## 2016-08-20 DIAGNOSIS — J9602 Acute respiratory failure with hypercapnia: Secondary | ICD-10-CM | POA: Diagnosis not present

## 2016-08-20 DIAGNOSIS — E43 Unspecified severe protein-calorie malnutrition: Secondary | ICD-10-CM | POA: Diagnosis present

## 2016-08-20 LAB — POCT I-STAT 3, ART BLOOD GAS (G3+)
ACID-BASE DEFICIT: 2 mmol/L (ref 0.0–2.0)
Bicarbonate: 27.6 mmol/L (ref 20.0–28.0)
O2 SAT: 97 %
PCO2 ART: 69.1 mmHg — AB (ref 32.0–48.0)
TCO2: 30 mmol/L (ref 0–100)
pH, Arterial: 7.207 — ABNORMAL LOW (ref 7.350–7.450)
pO2, Arterial: 117 mmHg — ABNORMAL HIGH (ref 83.0–108.0)

## 2016-08-20 LAB — RESPIRATORY PANEL BY PCR
Adenovirus: NOT DETECTED
BORDETELLA PERTUSSIS-RVPCR: NOT DETECTED
CHLAMYDOPHILA PNEUMONIAE-RVPPCR: NOT DETECTED
CORONAVIRUS HKU1-RVPPCR: NOT DETECTED
Coronavirus 229E: NOT DETECTED
Coronavirus NL63: NOT DETECTED
Coronavirus OC43: NOT DETECTED
INFLUENZA A-RVPPCR: NOT DETECTED
INFLUENZA B-RVPPCR: NOT DETECTED
METAPNEUMOVIRUS-RVPPCR: NOT DETECTED
Mycoplasma pneumoniae: NOT DETECTED
PARAINFLUENZA VIRUS 2-RVPPCR: NOT DETECTED
PARAINFLUENZA VIRUS 3-RVPPCR: NOT DETECTED
PARAINFLUENZA VIRUS 4-RVPPCR: NOT DETECTED
Parainfluenza Virus 1: NOT DETECTED
Respiratory Syncytial Virus: NOT DETECTED
Rhinovirus / Enterovirus: NOT DETECTED

## 2016-08-20 LAB — BASIC METABOLIC PANEL
Anion gap: 11 (ref 5–15)
Anion gap: 8 (ref 5–15)
BUN: 19 mg/dL (ref 6–20)
BUN: 20 mg/dL (ref 6–20)
CO2: 24 mmol/L (ref 22–32)
CO2: 25 mmol/L (ref 22–32)
CREATININE: 0.98 mg/dL (ref 0.44–1.00)
Calcium: 8.1 mg/dL — ABNORMAL LOW (ref 8.9–10.3)
Calcium: 9.2 mg/dL (ref 8.9–10.3)
Chloride: 102 mmol/L (ref 101–111)
Chloride: 105 mmol/L (ref 101–111)
Creatinine, Ser: 0.95 mg/dL (ref 0.44–1.00)
GFR calc Af Amer: >60 mL/min (ref >60)
GFR, EST NON AFRICAN AMERICAN: 52 mL/min — AB (ref >60)
GFR, EST NON AFRICAN AMERICAN: 54 mL/min — AB (ref >60)
Glucose, Bld: 159 mg/dL — ABNORMAL HIGH (ref 65–99)
Glucose, Bld: 197 mg/dL — ABNORMAL HIGH (ref 65–99)
Potassium: 4.1 mmol/L (ref 3.5–5.1)
Potassium: 4.3 mmol/L (ref 3.5–5.1)
SODIUM: 137 mmol/L (ref 135–145)
SODIUM: 138 mmol/L (ref 135–145)

## 2016-08-20 LAB — MRSA PCR SCREENING: MRSA by PCR: NEGATIVE

## 2016-08-20 LAB — CBC WITH DIFFERENTIAL/PLATELET
BASOS PCT: 0 %
Basophils Absolute: 0 10*3/uL (ref 0.0–0.1)
EOS ABS: 0.8 10*3/uL — AB (ref 0.0–0.7)
Eosinophils Relative: 4 %
HCT: 35.8 % — ABNORMAL LOW (ref 36.0–46.0)
Hemoglobin: 11.1 g/dL — ABNORMAL LOW (ref 12.0–15.0)
Lymphocytes Relative: 31 %
Lymphs Abs: 6.4 10*3/uL — ABNORMAL HIGH (ref 0.7–4.0)
MCH: 32.8 pg (ref 26.0–34.0)
MCHC: 31 g/dL (ref 30.0–36.0)
MCV: 105.9 fL — ABNORMAL HIGH (ref 78.0–100.0)
MONO ABS: 1.6 10*3/uL — AB (ref 0.1–1.0)
Monocytes Relative: 8 %
NEUTROS ABS: 11.8 10*3/uL — AB (ref 1.7–7.7)
Neutrophils Relative %: 57 %
PLATELETS: 319 10*3/uL (ref 150–400)
RBC: 3.38 MIL/uL — ABNORMAL LOW (ref 3.87–5.11)
RDW: 14.4 % (ref 11.5–15.5)
WBC: 20.6 10*3/uL — ABNORMAL HIGH (ref 4.0–10.5)

## 2016-08-20 LAB — I-STAT ARTERIAL BLOOD GAS, ED
Acid-base deficit: 4 mmol/L — ABNORMAL HIGH (ref 0.0–2.0)
Acid-base deficit: 2 mmol/L (ref 0.0–2.0)
BICARBONATE: 27.5 mmol/L (ref 20.0–28.0)
Bicarbonate: 27.1 mmol/L (ref 20.0–28.0)
O2 SAT: 98 %
O2 Saturation: 95 %
PCO2 ART: 71.7 mmHg — AB (ref 32.0–48.0)
PH ART: 7.103 — AB (ref 7.350–7.450)
PO2 ART: 130 mmHg — AB (ref 83.0–108.0)
Patient temperature: 36.5
TCO2: 30 mmol/L (ref 0–100)
TCO2: 30 mmol/L (ref 0–100)
pCO2 arterial: 87.1 mmHg (ref 32.0–48.0)
pH, Arterial: 7.188 — CL (ref 7.350–7.450)
pO2, Arterial: 107 mmHg (ref 83.0–108.0)

## 2016-08-20 LAB — CBC
HEMATOCRIT: 31.7 % — AB (ref 36.0–46.0)
HEMOGLOBIN: 9.9 g/dL — AB (ref 12.0–15.0)
MCH: 33.2 pg (ref 26.0–34.0)
MCHC: 31.2 g/dL (ref 30.0–36.0)
MCV: 106.4 fL — ABNORMAL HIGH (ref 78.0–100.0)
Platelets: 251 10*3/uL (ref 150–400)
RBC: 2.98 MIL/uL — AB (ref 3.87–5.11)
RDW: 14.5 % (ref 11.5–15.5)
WBC: 14.6 10*3/uL — ABNORMAL HIGH (ref 4.0–10.5)

## 2016-08-20 LAB — TROPONIN I
TROPONIN I: 0.11 ng/mL — AB (ref <0.03)
Troponin I: 0.13 ng/mL (ref <0.03)

## 2016-08-20 LAB — I-STAT TROPONIN, ED: TROPONIN I, POC: 0.11 ng/mL — AB (ref 0.00–0.08)

## 2016-08-20 LAB — BRAIN NATRIURETIC PEPTIDE: B NATRIURETIC PEPTIDE 5: 2074.8 pg/mL — AB (ref 0.0–100.0)

## 2016-08-20 LAB — MAGNESIUM: Magnesium: 1.5 mg/dL — ABNORMAL LOW (ref 1.7–2.4)

## 2016-08-20 LAB — I-STAT CG4 LACTIC ACID, ED: LACTIC ACID, VENOUS: 0.97 mmol/L (ref 0.5–1.9)

## 2016-08-20 LAB — PHOSPHORUS: Phosphorus: 5.2 mg/dL — ABNORMAL HIGH (ref 2.5–4.6)

## 2016-08-20 MED ORDER — ENOXAPARIN SODIUM 30 MG/0.3ML ~~LOC~~ SOLN
30.0000 mg | SUBCUTANEOUS | Status: DC
  Administered 2016-08-20 – 2016-08-23 (×4): 30 mg via SUBCUTANEOUS
  Filled 2016-08-20 (×4): qty 0.3

## 2016-08-20 MED ORDER — PIPERACILLIN-TAZOBACTAM 3.375 G IVPB 30 MIN
3.3750 g | Freq: Once | INTRAVENOUS | Status: AC
Start: 2016-08-20 — End: 2016-08-20
  Administered 2016-08-20: 3.375 g via INTRAVENOUS
  Filled 2016-08-20: qty 50

## 2016-08-20 MED ORDER — FENTANYL CITRATE (PF) 100 MCG/2ML IJ SOLN
INTRAMUSCULAR | Status: AC
  Filled 2016-08-20: qty 2

## 2016-08-20 MED ORDER — ETOMIDATE 2 MG/ML IV SOLN
INTRAVENOUS | Status: AC | PRN
  Administered 2016-08-20: 20 mg via INTRAVENOUS

## 2016-08-20 MED ORDER — FUROSEMIDE 10 MG/ML IJ SOLN
40.0000 mg | Freq: Once | INTRAMUSCULAR | Status: AC
  Administered 2016-08-20: 40 mg via INTRAVENOUS
  Filled 2016-08-20: qty 4

## 2016-08-20 MED ORDER — CHLORHEXIDINE GLUCONATE 0.12% ORAL RINSE (MEDLINE KIT)
15.0000 mL | Freq: Two times a day (BID) | OROMUCOSAL | Status: DC
  Administered 2016-08-20 – 2016-08-21 (×3): 15 mL via OROMUCOSAL

## 2016-08-20 MED ORDER — SODIUM CHLORIDE 0.9 % IV BOLUS (SEPSIS)
2000.0000 mL | Freq: Once | INTRAVENOUS | Status: AC
  Administered 2016-08-20: 2000 mL via INTRAVENOUS

## 2016-08-20 MED ORDER — PROPOFOL 1000 MG/100ML IV EMUL
5.0000 ug/kg/min | INTRAVENOUS | Status: DC
  Administered 2016-08-20: 60 ug/kg/min via INTRAVENOUS
  Administered 2016-08-21: 40 ug/kg/min via INTRAVENOUS
  Filled 2016-08-20 (×3): qty 100

## 2016-08-20 MED ORDER — MAGNESIUM SULFATE 4 GM/100ML IV SOLN
4.0000 g | Freq: Once | INTRAVENOUS | Status: AC
  Administered 2016-08-20: 4 g via INTRAVENOUS
  Filled 2016-08-20: qty 100

## 2016-08-20 MED ORDER — FAMOTIDINE IN NACL 20-0.9 MG/50ML-% IV SOLN
20.0000 mg | Freq: Two times a day (BID) | INTRAVENOUS | Status: DC
  Administered 2016-08-20 – 2016-08-23 (×8): 20 mg via INTRAVENOUS
  Filled 2016-08-20 (×8): qty 50

## 2016-08-20 MED ORDER — ALBUTEROL SULFATE (2.5 MG/3ML) 0.083% IN NEBU
2.5000 mg | INHALATION_SOLUTION | RESPIRATORY_TRACT | Status: DC | PRN
  Administered 2016-08-20: 2.5 mg via RESPIRATORY_TRACT
  Filled 2016-08-20: qty 3

## 2016-08-20 MED ORDER — PIPERACILLIN-TAZOBACTAM 3.375 G IVPB
3.3750 g | Freq: Three times a day (TID) | INTRAVENOUS | Status: DC
  Administered 2016-08-20 – 2016-08-24 (×12): 3.375 g via INTRAVENOUS
  Filled 2016-08-20 (×14): qty 50

## 2016-08-20 MED ORDER — FENTANYL CITRATE (PF) 100 MCG/2ML IJ SOLN
50.0000 ug | Freq: Once | INTRAMUSCULAR | Status: AC
  Administered 2016-08-20: 50 ug via INTRAVENOUS

## 2016-08-20 MED ORDER — SENNOSIDES 8.8 MG/5ML PO SYRP
5.0000 mL | ORAL_SOLUTION | Freq: Two times a day (BID) | ORAL | Status: DC | PRN
  Filled 2016-08-20: qty 5

## 2016-08-20 MED ORDER — PROPOFOL 1000 MG/100ML IV EMUL
5.0000 ug/kg/min | Freq: Once | INTRAVENOUS | Status: DC
Start: 2016-08-20 — End: 2016-08-23
  Filled 2016-08-20 (×2): qty 100

## 2016-08-20 MED ORDER — SODIUM CHLORIDE 0.9 % IV BOLUS (SEPSIS): 30.0000 mL/kg | Freq: Once | INTRAVENOUS | Status: DC

## 2016-08-20 MED ORDER — VANCOMYCIN HCL 500 MG IV SOLR
500.0000 mg | INTRAVENOUS | Status: DC
  Administered 2016-08-21 – 2016-08-24 (×4): 500 mg via INTRAVENOUS
  Filled 2016-08-20 (×4): qty 500

## 2016-08-20 MED ORDER — SODIUM CHLORIDE 0.9 % IV SOLN: 250.0000 mL | INTRAVENOUS | Status: DC | PRN

## 2016-08-20 MED ORDER — FENTANYL CITRATE (PF) 100 MCG/2ML IJ SOLN
50.0000 ug | INTRAMUSCULAR | Status: DC | PRN
  Administered 2016-08-20 – 2016-08-21 (×2): 50 ug via INTRAVENOUS
  Filled 2016-08-20: qty 2

## 2016-08-20 MED ORDER — FENTANYL CITRATE (PF) 100 MCG/2ML IJ SOLN
50.0000 ug | Freq: Once | INTRAMUSCULAR | Status: AC
  Administered 2016-08-20: 50 ug via INTRAVENOUS
  Filled 2016-08-20: qty 2

## 2016-08-20 MED ORDER — PROPOFOL 1000 MG/100ML IV EMUL
INTRAVENOUS | Status: AC
  Filled 2016-08-20: qty 100

## 2016-08-20 MED ORDER — VANCOMYCIN HCL IN DEXTROSE 1-5 GM/200ML-% IV SOLN
1000.0000 mg | Freq: Once | INTRAVENOUS | Status: AC
  Administered 2016-08-20: 1000 mg via INTRAVENOUS
  Filled 2016-08-20: qty 200

## 2016-08-20 MED ORDER — BISACODYL 10 MG RE SUPP: 10.0000 mg | Freq: Every day | RECTAL | Status: DC | PRN

## 2016-08-20 MED ORDER — FENTANYL BOLUS VIA INFUSION
25.0000 ug | INTRAVENOUS | Status: DC | PRN
  Filled 2016-08-20: qty 25

## 2016-08-20 MED ORDER — ORAL CARE MOUTH RINSE
15.0000 mL | OROMUCOSAL | Status: DC
  Administered 2016-08-20 – 2016-08-21 (×14): 15 mL via OROMUCOSAL

## 2016-08-20 MED ORDER — IPRATROPIUM-ALBUTEROL 0.5-2.5 (3) MG/3ML IN SOLN
3.0000 mL | Freq: Four times a day (QID) | RESPIRATORY_TRACT | Status: DC
  Administered 2016-08-20 – 2016-08-21 (×7): 3 mL via RESPIRATORY_TRACT
  Filled 2016-08-20 (×7): qty 3

## 2016-08-20 MED ORDER — PROPOFOL 1000 MG/100ML IV EMUL
INTRAVENOUS | Status: DC | PRN
  Administered 2016-08-20: 10 ug/kg/min via INTRAVENOUS
  Administered 2016-08-20: 40 ug/kg/min via INTRAVENOUS

## 2016-08-20 MED ORDER — SUCCINYLCHOLINE CHLORIDE 20 MG/ML IJ SOLN
INTRAMUSCULAR | Status: AC | PRN
  Administered 2016-08-20: 100 mg via INTRAVENOUS

## 2016-08-20 MED ORDER — FENTANYL 2500MCG IN NS 250ML (10MCG/ML) PREMIX INFUSION: 25.0000 ug/h | INTRAVENOUS | Status: DC

## 2016-08-20 MED ORDER — FENTANYL CITRATE (PF) 100 MCG/2ML IJ SOLN
50.0000 ug | INTRAMUSCULAR | Status: DC | PRN
  Administered 2016-08-20: 50 ug via INTRAVENOUS
  Filled 2016-08-20 (×2): qty 2

## 2016-08-20 MED ORDER — ASPIRIN 81 MG PO CHEW: 324.0000 mg | CHEWABLE_TABLET | ORAL | Status: AC

## 2016-08-20 MED ORDER — IPRATROPIUM-ALBUTEROL 0.5-2.5 (3) MG/3ML IN SOLN
RESPIRATORY_TRACT | Status: AC
  Administered 2016-08-20: 3 mL
  Filled 2016-08-20: qty 3

## 2016-08-20 MED ORDER — ASPIRIN 300 MG RE SUPP: 300.0000 mg | RECTAL | Status: AC

## 2016-08-20 MED ORDER — METHYLPREDNISOLONE SODIUM SUCC 125 MG IJ SOLR
60.0000 mg | Freq: Every day | INTRAMUSCULAR | Status: DC
  Administered 2016-08-20 – 2016-08-21 (×2): 60 mg via INTRAVENOUS
  Filled 2016-08-20 (×3): qty 2

## 2016-08-20 NOTE — ED Triage Notes (Signed)
Pt brought to ED by [facility] from home home for respiratory distress, pt has a Hx of COPD with home O2, was out of O2 for most of the day. 2 Albuterol neb tx, 125 mg Solumedrol given by EMS pta to ED, no VS reported by EMS just SPO2 95% on the neb treatment with O2.

## 2016-08-20 NOTE — Progress Notes (Signed)
eLink Physician-Brief Progress Note Patient Name: Denise Adams DOB: 1931/12/28 MRN: [id number]   Date of Service  08/20/2016  HPI/Events of Note  hypomag  eICU Interventions  Mag replaced     Intervention Category Intermediate Interventions: Electrolyte abnormality - evaluation and management  Samina Weekes 08/20/2016, 3:56 AM

## 2016-08-20 NOTE — Progress Notes (Signed)
AM Follow Up Rounding   S:  RT reports air trapping on vent.  Hemodynamics stable.  Most recent ABG with hypercarbia but improving.    O: Blood pressure 124/69, pulse 82, temperature 98.1 F (36.7 C), temperature source Core (Comment), resp. rate (!) 22, height 5\' 2"  (1.575 m), weight 110 lb 14.3 oz (50.3 kg), SpO2 100 %.  General:  Frail elderly female in NAD on vent HEENT: MM pink/moist, ETT  Neuro: sedate, moves all ext's spontaneously  CV: s1s2 rrr, SEM 3/6 PULM: even/non-labored, lungs bilaterally coarse ZO:XWRUGI:soft, non-tender, bsx4 active  Extremities: warm/dry, no edema  Skin: no rashes or lesions   Recent Labs Lab 08/20/16 0013 08/20/16 0306  HGB 11.1* 9.9*  HCT 35.8* 31.7*  WBC 20.6* 14.6*  PLT 319 251    Recent Labs Lab 08/20/16 0013 08/20/16 0306  NA 138 137  K 4.3 4.1  CL 102 105  CO2 25 24  GLUCOSE 159* 197*  BUN 19 20  CREATININE 0.98 0.95  CALCIUM 9.2 8.1*  MG  --  1.5*  PHOS  --  5.2*    Recent Labs Lab 08/20/16 0128 08/20/16 0312 08/20/16 0527  PHART 7.103* 7.188* 7.207*  PCO2ART 87.1* 71.7* 69.1*  PO2ART 107.0 130.0* 117.0*  HCO3 27.1 27.5 27.6  TCO2 30 30 30   O2SAT 95.0 98.0 97.0    A:  Acute Hypoxic / Hypercarbic Respiratory Failure  2L O2 Dependent COPD R/O PNA AF vs AFlutter  ? CHF  Anemia    P: Continue vent support Intermittent CXR  [facility] after episode of poor air movement / low VT on vent> follow up ABG  Assess CXR now to confirm ETT placement / no PTX Empiric antibiotics as outlined in Dr. Belinda BlockGiddings note  Follow-up labs ordered for 5/14 a.m. Await echo  Lovenox for DVT prophylaxis  Pepcid for SUP s/p lasix > good UOP  Minimize sedation as able  Daily WUA / SBT    Additional CC Time: 30 minutes   Canary BrimBrandi Osualdo Hansell, NP-C [place] [facility] Pgr: [phone] or if no answer [phone] 08/20/2016, 8:27 AM

## 2016-08-20 NOTE — H&P (Signed)
Name: Denise Adams MRN: [id number] DOB: July 01, 1931    LOS: 0  Referring Provider:  Dr. Patria Mane Reason for Referral:  Hypercarbic respiratory failure  PULMONARY / CRITICAL CARE MEDICINE  HPI:  History taken from EMR as pt is intubated and sedated and no family is at the bedside.  Denise Adams is an 83 y/o woman with a history of COPD, DM and HTN who had worsening shortness of breath and confusion prior to arrival in the ED.  She is on 2L O2 but had apparently run out.  She developed acute respiratory distress after arrival in the ED and was intubated.  Past Medical History:  Diagnosis Date  . Aortic stenosis   . COPD (chronic obstructive pulmonary disease) (HCC)   . Diabetes mellitus   . HLD (hyperlipidemia)   . HTN (hypertension)   . Myocardial infarction (HCC)   . PVD (peripheral vascular disease) (HCC)    Past Surgical History:  Procedure Laterality Date  . ABDOMINAL HYSTERECTOMY    . APPENDECTOMY    . CAROTID ENDARTERECTOMY     Left  . CATARACT EXTRACTION    . FOOT SURGERY     right  . TONSILLECTOMY     Prior to Admission medications   Medication Sig Start Date End Date Taking? Authorizing Provider  acetaminophen (TYLENOL) 325 MG tablet Take 2 tablets (650 mg total) by mouth every 6 (six) hours as needed for mild pain (or Fever >/= 101). 06/26/16   Alison Murray, MD  albuterol (PROVENTIL HFA;VENTOLIN HFA) 108 (90 BASE) MCG/ACT inhaler Inhale 2 puffs into the lungs every 6 (six) hours as needed for shortness of breath.     [provider]  arformoterol (BROVANA) 15 MCG/2ML NEBU Take 2 mLs (15 mcg total) by nebulization 2 (two) times daily. 06/26/16   Alison Murray, MD  aspirin EC 81 MG tablet Take 81 mg by mouth daily.    [provider]  budesonide (PULMICORT) 0.5 MG/2ML nebulizer solution Take 2 mLs (0.5 mg total) by nebulization 2 (two) times daily. 06/26/16   Alison Murray, MD  budesonide-formoterol [facility]) 160-4.5 MCG/ACT inhaler Inhale 2 puffs  into the lungs 2 (two) times daily. 05/13/16   Rai, Delene Ruffini, MD  Cholecalciferol (VITAMIN D) 2000 UNITS CAPS Take 2,000 Units by mouth daily.     [provider]  cyanocobalamin 1000 MCG tablet Take 1,000 mcg by mouth daily. Reported on 09/21/2015    [provider]  escitalopram (LEXAPRO) 5 MG tablet Take 5 mg by mouth at bedtime.  06/18/16   [provider]  feeding supplement, ENSURE ENLIVE, (ENSURE ENLIVE) LIQD Take 237 mLs by mouth 2 (two) times daily between meals. 06/26/16   Alison Murray, MD  guaiFENesin (MUCINEX) 600 MG 12 hr tablet Take 1 tablet (600 mg total) by mouth 2 (two) times daily as needed. 06/26/16   Alison Murray, MD  ipratropium-albuterol (DUONEB) 0.5-2.5 (3) MG/3ML SOLN Take 3 mLs by nebulization every 4 (four) hours as needed. 06/26/16   Alison Murray, MD  Krill Oil 1000 MG CAPS Take 1,000 mg by mouth daily.    [provider]  methocarbamol (ROBAXIN) 500 MG tablet Take 1 tablet (500 mg total) by mouth every 8 (eight) hours as needed for muscle spasms. Patient not taking: Reported on 06/19/2016 05/13/16   Rai, Delene Ruffini, MD  Multiple Vitamins-Minerals (PRESERVISION AREDS PO) Take 1 tablet by mouth 2 (two) times daily.     [provider]  rOPINIRole (REQUIP) 0.5 MG tablet Take 1 tablet (0.5 mg total) by mouth at bedtime. 06/26/16   Alison Murray, MD   Allergies No Known Allergies  Family History Family History  Problem Relation Age of Onset  . Coronary artery disease Father   . Coronary artery disease Brother 46  . Coronary artery disease Brother 58   Social History  reports that she has been smoking Cigarettes.  She has a 45.00 pack-year smoking history. She has never used smokeless tobacco. She reports that she does not drink alcohol or use drugs.  Review Of Systems:  Could not be obtained as pt is intubated and sedated.   Brief patient description:  83 y/o woman with COPD admitted with respiratory failure  Events Since  Admission: Intubated  Current Status:  Vital Signs: Temp:  [97.7 F (36.5 C)-99.5 F (37.5 C)] 97.7 F (36.5 C) (05/13 0315) Pulse Rate:  [103-145] 112 (05/13 0359) Resp:  [16-29] 28 (05/13 0359) BP: (100-171)/(51-85) 124/81 (05/13 0359) SpO2:  [86 %-100 %] 98 % (05/13 0359) FiO2 (%):  [60 %-100 %] 60 % (05/13 0359) Weight:  [44 kg (97 lb)] 44 kg (97 lb) (05/13 0009)  Physical Examination: General:  Intubated woman on stretcher Neuro:  Sedated but moving all 4 extremeties HEENT:  PERRL, EOMI, ETT in place Neck:  Trachea midline Cardiovascular:  HR 140, appears regular, no MRG Lungs:  Diffuse bilateral expiratory wheezes.  Abdomen:  Soft, NTND, no HSM Musculoskeletal:  No joint abnormalities,  Skin:  No edema  Active Problems:   Acute respiratory failure (HCC)   Respiratory failure (HCC)   ASSESSMENT AND PLAN  PULMONARY  Recent Labs Lab 08/20/16 0128 08/20/16 0312  PHART 7.103* 7.188*  PCO2ART 87.1* 71.7*  PO2ART 107.0 130.0*  HCO3 27.1 27.5  O2SAT 95.0 98.0   Ventilator Settings: Vent Mode: PRVC FiO2 (%):  [60 %-100 %] 60 % Set Rate:  [20 bmp-26 bmp] 26 bmp Vt Set:  [330 mL] 330 mL PEEP:  [5 cmH20] 5 cmH20 Plateau Pressure:  [15 cmH20-17 cmH20] 17 cmH20 CXR:  Pulmonary edema ETT:  8.0  A: respiratory failure P:   COPD exacerbation v. Infection v. Pulmonary edema Intubated Wean vent as able SUP VAP prevention Solumedrol 60mg  daily Nebs  CARDIOVASCULAR  Recent Labs Lab 08/20/16 0013 08/20/16 0022  TROPONINI 0.11*  --   LATICACIDVEN  --  0.97   ECG:  Rate 132 - a flutter v a fib   A: a-flutter v fib with RVR ? CHF P:  Converted back to sinus rhythm without intervention Echo ordered  RENAL  Recent Labs Lab 08/20/16 0013 08/20/16 0306  NA 138 137  K 4.3 4.1  CL 102 105  CO2 25 24  BUN 19 20  CREATININE 0.98 0.95  CALCIUM 9.2 8.1*  MG  --  1.5*  PHOS  --  5.2*   Intake/Output      05/12 0701 - 05/13 0700   Other 100    IV Piggyback 2250   Total Intake(mL/kg) 2350 (53.4)   Urine (mL/kg/hr) 800   Total Output 800   Net +1550        Foley:  08/20/16  A:  Volume overload? P:   Lasix given for pulmonary edema BNP elevated Follow  UOP and electrolytes   HEMATOLOGIC  Recent Labs Lab 08/20/16 0013 08/20/16 0306  HGB 11.1* 9.9*  HCT 35.8* 31.7*  PLT 319 251   A:  anemia P:  Follow CBC  INFECTIOUS  Recent Labs Lab 08/20/16 0013 08/20/16 0306  WBC 20.6* 14.6*   Cultures: Blood  Antibiotics: Vanc 5/13---> Zosyn 5/13--->  A:  ? pneumonia P:   abx as above pending cx  ENDOCRINE No results for input(s): GLUCAP in the last 168 hours. A:  At risk for hypo/hyperglycemia   P:   Follow glucose on BMP  NEUROLOGIC  A:  sedated P:   RASS goal -1 to 1 Daily awakening trial  BEST PRACTICE / DISPOSITION Level of Care:  ICU Primary Service:  PCCM Consultants:   Code Status:  full Diet:  NPO DVT Px:  lovenox GI Px:  pepcid Skin Integrity:  good Social / Family:    Carolan ClinesGIDDINGS, Stephanieann Popescu K., M.D. Pulmonary and Critical Care Medicine [facility] Pager: [phone]  08/20/2016, 4:09 AM

## 2016-08-20 NOTE — Progress Notes (Signed)
Pharmacy Antibiotic Note  Rennis ChrisKatherine M Mehta is a 83 y.o. female admitted on 08/19/2016 with sepsis.  Pharmacy has been consulted for Vancomycin/Zosyn dosing. WBC elevate. CrCl ~30.   Plan: Vancomycin 500 mg IV q24h Zosyn 3.375G IV q8h to be infused over 4 hours Trend WBC, temp, renal function  F/U infectious work-up Drug levels as indicated  Height: 5\' 2"  (157.5 cm) Weight: 97 lb (44 kg) IBW/kg (Calculated) : 50.1  Temp (24hrs), Avg:99.1 F (37.3 C), Min:98.6 F (37 C), Max:99.5 F (37.5 C)   Recent Labs Lab 08/20/16 0013 08/20/16 0022  WBC 20.6*  --   CREATININE 0.98  --   LATICACIDVEN  --  0.97    Estimated Creatinine Clearance: 30.7 mL/min (by C-G formula based on SCr of 0.98 mg/dL).    No Known Allergies   Abran DukeLedford, Yisroel Mullendore 08/20/2016 2:32 AM

## 2016-08-21 ENCOUNTER — Inpatient Hospital Stay (HOSPITAL_COMMUNITY): Payer: Medicare Other

## 2016-08-21 DIAGNOSIS — J9602 Acute respiratory failure with hypercapnia: Secondary | ICD-10-CM

## 2016-08-21 LAB — BLOOD CULTURE ID PANEL (REFLEXED)
ACINETOBACTER BAUMANNII: NOT DETECTED
CANDIDA GLABRATA: NOT DETECTED
CANDIDA KRUSEI: NOT DETECTED
CANDIDA PARAPSILOSIS: NOT DETECTED
Candida albicans: NOT DETECTED
Candida tropicalis: NOT DETECTED
ENTEROBACTER CLOACAE COMPLEX: NOT DETECTED
ENTEROBACTERIACEAE SPECIES: NOT DETECTED
ESCHERICHIA COLI: NOT DETECTED
Enterococcus species: NOT DETECTED
Haemophilus influenzae: NOT DETECTED
KLEBSIELLA OXYTOCA: NOT DETECTED
KLEBSIELLA PNEUMONIAE: NOT DETECTED
Listeria monocytogenes: NOT DETECTED
Methicillin resistance: DETECTED — AB
Neisseria meningitidis: NOT DETECTED
PSEUDOMONAS AERUGINOSA: NOT DETECTED
Proteus species: NOT DETECTED
STAPHYLOCOCCUS AUREUS BCID: NOT DETECTED
STAPHYLOCOCCUS SPECIES: DETECTED — AB
STREPTOCOCCUS PNEUMONIAE: NOT DETECTED
Serratia marcescens: NOT DETECTED
Streptococcus agalactiae: NOT DETECTED
Streptococcus pyogenes: NOT DETECTED
Streptococcus species: NOT DETECTED

## 2016-08-21 LAB — CBC
HCT: 27.5 % — ABNORMAL LOW (ref 36.0–46.0)
Hemoglobin: 8.9 g/dL — ABNORMAL LOW (ref 12.0–15.0)
MCH: 33 pg (ref 26.0–34.0)
MCHC: 32.4 g/dL (ref 30.0–36.0)
MCV: 101.9 fL — AB (ref 78.0–100.0)
PLATELETS: 243 10*3/uL (ref 150–400)
RBC: 2.7 MIL/uL — AB (ref 3.87–5.11)
RDW: 14.5 % (ref 11.5–15.5)
WBC: 12.2 10*3/uL — AB (ref 4.0–10.5)

## 2016-08-21 LAB — BASIC METABOLIC PANEL
Anion gap: 11 (ref 5–15)
BUN: 31 mg/dL — AB (ref 6–20)
CALCIUM: 8.4 mg/dL — AB (ref 8.9–10.3)
CHLORIDE: 101 mmol/L (ref 101–111)
CO2: 28 mmol/L (ref 22–32)
CREATININE: 1.25 mg/dL — AB (ref 0.44–1.00)
GFR calc non Af Amer: 39 mL/min — ABNORMAL LOW (ref >60)
GFR, EST AFRICAN AMERICAN: 45 mL/min — AB (ref >60)
Glucose, Bld: 94 mg/dL (ref 65–99)
Potassium: 3.5 mmol/L (ref 3.5–5.1)
SODIUM: 140 mmol/L (ref 135–145)

## 2016-08-21 LAB — HEPATIC FUNCTION PANEL
ALBUMIN: 2.9 g/dL — AB (ref 3.5–5.0)
ALT: 19 U/L (ref 14–54)
AST: 27 U/L (ref 15–41)
Alkaline Phosphatase: 62 U/L (ref 38–126)
Bilirubin, Direct: 0.1 mg/dL (ref 0.1–0.5)
Indirect Bilirubin: 0.4 mg/dL (ref 0.3–0.9)
Total Bilirubin: 0.5 mg/dL (ref 0.3–1.2)
Total Protein: 5.5 g/dL — ABNORMAL LOW (ref 6.5–8.1)

## 2016-08-21 LAB — URINE CULTURE: Culture: NO GROWTH

## 2016-08-21 LAB — BLOOD GAS, ARTERIAL
Acid-base deficit: 0.2 mmol/L (ref 0.0–2.0)
Bicarbonate: 24.6 mmol/L (ref 20.0–28.0)
DRAWN BY: 505221
FIO2: 50
LHR: 14 {breaths}/min
O2 Saturation: 92.3 %
PEEP/CPAP: 5 cmH2O
PRESSURE CONTROL: 15 cmH2O
Patient temperature: 98.6
pCO2 arterial: 45.7 mmHg (ref 32.0–48.0)
pH, Arterial: 7.351 (ref 7.350–7.450)
pO2, Arterial: 66.8 mmHg — ABNORMAL LOW (ref 83.0–108.0)

## 2016-08-21 LAB — TROPONIN I
Troponin I: 0.13 ng/mL (ref <0.03)
Troponin I: 0.14 ng/mL (ref <0.03)

## 2016-08-21 LAB — PATHOLOGIST SMEAR REVIEW

## 2016-08-21 LAB — PHOSPHORUS: Phosphorus: 4.6 mg/dL (ref 2.5–4.6)

## 2016-08-21 LAB — MAGNESIUM: MAGNESIUM: 2.5 mg/dL — AB (ref 1.7–2.4)

## 2016-08-21 MED ORDER — ROPINIROLE HCL 1 MG PO TABS
0.5000 mg | ORAL_TABLET | Freq: Every day | ORAL | Status: DC
  Administered 2016-08-21 – 2016-08-23 (×3): 0.5 mg via ORAL
  Filled 2016-08-21 (×4): qty 1

## 2016-08-21 MED ORDER — ASPIRIN EC 81 MG PO TBEC
81.0000 mg | DELAYED_RELEASE_TABLET | Freq: Every day | ORAL | Status: DC
  Administered 2016-08-21 – 2016-08-24 (×4): 81 mg via ORAL
  Filled 2016-08-21 (×4): qty 1

## 2016-08-21 MED ORDER — ORAL CARE MOUTH RINSE
15.0000 mL | Freq: Two times a day (BID) | OROMUCOSAL | Status: DC
  Administered 2016-08-22: 15 mL via OROMUCOSAL

## 2016-08-21 MED ORDER — ACETAMINOPHEN 160 MG/5ML PO SOLN
650.0000 mg | Freq: Four times a day (QID) | ORAL | Status: DC | PRN
  Administered 2016-08-21 – 2016-08-23 (×2): 650 mg
  Filled 2016-08-21 (×2): qty 20.3

## 2016-08-21 NOTE — Progress Notes (Signed)
Initial Nutrition Assessment  DOCUMENTATION CODES:   Severe malnutrition in context of chronic illness  INTERVENTION:   Once diet advanced recommend Ensure Enlive po TID, each supplement provides 350 kcal and 20 grams of protein   NUTRITION DIAGNOSIS:   Malnutrition (Severe) related to chronic illness (COPD) as evidenced by severe depletion of body fat, severe depletion of muscle mass.  GOAL:   Patient will meet greater than or equal to 90% of their needs  MONITOR:   PO intake, Supplement acceptance, Diet advancement  REASON FOR ASSESSMENT:   Consult, Malnutrition Screening Tool Assessment of nutrition requirement/status  ASSESSMENT:   Pt with hx of COPD, DM, HTN admitted with worsening SOB, pt is on 2L O2 at home but had run out. Intubated after arrival for COPD exacerbation with pulmonary edema but now extubated.    Per family pt had gained up to 107 lb at her last doctor visit. Pt and her sister are living with son-in-law. Family is doing most of the cooking and they are teaching her sister to use microwave.  Nutrition-Focused physical exam completed. Findings are severe fat depletion, severe muscle depletion, and no edema.     Diet Order:  Diet NPO time specified  Skin:  Reviewed, no issues  Last BM:  unknown  Height:   Ht Readings from Last 1 Encounters:  08/20/16 5\' 2"  (1.575 m)    Weight:   Wt Readings from Last 1 Encounters:  08/21/16 102 lb 11.8 oz (46.6 kg)    Ideal Body Weight:  50 kg  BMI:  Body mass index is 18.79 kg/m.  Estimated Nutritional Needs:   Kcal:  1200-1400  Protein:  65-75 grams  Fluid:  > 1.5 L/day  EDUCATION NEEDS:   No education needs identified at this time  Kendell BaneHeather Kamoria Lucien RD, LDN, CNSC [phone] Pager [phone] After Hours Pager

## 2016-08-21 NOTE — Care Management Note (Signed)
Case Management Note Donn PieriniKristi Albaro Deviney RN, BSN Unit 2W-Case Manager-- 2H coverage [phone]  Patient Details  Name: Denise [name] MRN: [id number] Date of Birth: Dec 10, 1931  Subjective/Objective:   Pt admitted with Acute Resp. Failure- Intubated- currently remains on VENT -08/21/16 with plan to wean today                Action/Plan: PTA pt lived at home- has home 02-2L- CM to follow for d/c needs- once off vent  Expected Discharge Date:                  Expected Discharge Plan:     In-House Referral:     Discharge planning Services  CM Consult  Post Acute Care Choice:    Choice offered to:     DME Arranged:    DME Agency:     HH Arranged:    HH Agency:     Status of Service:  In process, will continue to follow  If discussed at Long Length of Stay Meetings, dates discussed:    Additional Comments:  Darrold SpanWebster, Ananya Mccleese Hall, RN 08/21/2016, 11:20 AM

## 2016-08-21 NOTE — Progress Notes (Signed)
eLink Physician-Brief Progress Note Patient Name: Denise Adams DOB: [birth year] MRN: [id number]   Date of Service  08/21/2016  HPI/Events of Note  Low-grade fever.   eICU Interventions  Tylenol when necessary.  Check LFTs in the morning.  Send for trach aspirate for culture.      Intervention Category Evaluation Type: Other  Linder Prajapati Bridgette Habermannngelo A De Dios 08/21/2016, 12:28 AM

## 2016-08-21 NOTE — Progress Notes (Signed)
PHARMACY - PHYSICIAN COMMUNICATION CRITICAL VALUE ALERT - BLOOD CULTURE IDENTIFICATION (BCID)  Results for orders placed or performed during the hospital encounter of 08/19/16  Blood Culture ID Panel (Reflexed) (Collected: 08/20/2016 12:01 AM)  Result Value Ref Range   Enterococcus species NOT DETECTED NOT DETECTED   Listeria monocytogenes NOT DETECTED NOT DETECTED   Staphylococcus species DETECTED (A) NOT DETECTED   Staphylococcus aureus NOT DETECTED NOT DETECTED   Methicillin resistance DETECTED (A) NOT DETECTED   Streptococcus species NOT DETECTED NOT DETECTED   Streptococcus agalactiae NOT DETECTED NOT DETECTED   Streptococcus pneumoniae NOT DETECTED NOT DETECTED   Streptococcus pyogenes NOT DETECTED NOT DETECTED   Acinetobacter baumannii NOT DETECTED NOT DETECTED   Enterobacteriaceae species NOT DETECTED NOT DETECTED   Enterobacter cloacae complex NOT DETECTED NOT DETECTED   Escherichia coli NOT DETECTED NOT DETECTED   Klebsiella oxytoca NOT DETECTED NOT DETECTED   Klebsiella pneumoniae NOT DETECTED NOT DETECTED   Proteus species NOT DETECTED NOT DETECTED   Serratia marcescens NOT DETECTED NOT DETECTED   Haemophilus influenzae NOT DETECTED NOT DETECTED   Neisseria meningitidis NOT DETECTED NOT DETECTED   Pseudomonas aeruginosa NOT DETECTED NOT DETECTED   Candida albicans NOT DETECTED NOT DETECTED   Candida glabrata NOT DETECTED NOT DETECTED   Candida krusei NOT DETECTED NOT DETECTED   Candida parapsilosis NOT DETECTED NOT DETECTED   Candida tropicalis NOT DETECTED NOT DETECTED    Name of physician (or Provider) Contacted: CCM  Changes to prescribed antibiotics required: None, remains on Zosyn and vancomycin for possible acute bronchitis and likely a contaminant as not MRSA.   Sheron NightingaleJames A Arvine Clayburn 08/21/2016  8:26 AM

## 2016-08-21 NOTE — Progress Notes (Signed)
eLink Physician-Brief Progress Note Patient Name: Denise Adams DOB: 05-18-1931 MRN: [id number]   Date of Service  08/21/2016  HPI/Events of Note    eICU Interventions  Home requip orderd     Intervention Category Intermediate Interventions: Other:  Nidhi Jacome S. 08/21/2016, 6:49 PM

## 2016-08-21 NOTE — Procedures (Signed)
Extubation Procedure Note  Patient Details:   Name: Denise Adams DOB: 05/10/1931 MRN: [id number]   Airway Documentation:     Evaluation  O2 sats: stable throughout Complications: No apparent complications Patient did tolerate procedure well. Bilateral Breath Sounds: Clear, Diminished   Yes   Pt extubated per MD order.  Placed on 4L nasal cannula and tolerating well at this time.  RT will continue to monitor.   Denise Adams, Denise Adams 08/21/2016, 10:09 AM

## 2016-08-21 NOTE — Progress Notes (Signed)
Name: Denise Adams MRN: [id number] DOB: March 07, 1932    LOS: 1  Referring Provider:  Dr. Patria Mane Reason for Referral:  Hypercarbic respiratory failure  PULMONARY / CRITICAL CARE MEDICINE  HPI:  History taken from EMR as pt is intubated and sedated and no family is at the bedside.  Denise Adams is an 83 y/o woman with a history of COPD, DM and HTN who had worsening shortness of breath and confusion prior to arrival in the ED.  She is on 2L O2 but had apparently run out.  She developed acute respiratory distress after arrival in the ED and was intubated.  Past Medical History:  Diagnosis Date  . Aortic stenosis   . COPD (chronic obstructive pulmonary disease) (HCC)   . Diabetes mellitus   . HLD (hyperlipidemia)   . HTN (hypertension)   . Myocardial infarction (HCC)   . PVD (peripheral vascular disease) (HCC)    Past Surgical History:  Procedure Laterality Date  . ABDOMINAL HYSTERECTOMY    . APPENDECTOMY    . CAROTID ENDARTERECTOMY     Left  . CATARACT EXTRACTION    . FOOT SURGERY     right  . TONSILLECTOMY     Prior to Admission medications   Medication Sig Start Date End Date Taking? Authorizing Provider  acetaminophen (TYLENOL) 325 MG tablet Take 2 tablets (650 mg total) by mouth every 6 (six) hours as needed for mild pain (or Fever >/= 101). 06/26/16   Alison Murray, MD  albuterol (PROVENTIL HFA;VENTOLIN HFA) 108 (90 BASE) MCG/ACT inhaler Inhale 2 puffs into the lungs every 6 (six) hours as needed for shortness of breath.     [provider]  arformoterol (BROVANA) 15 MCG/2ML NEBU Take 2 mLs (15 mcg total) by nebulization 2 (two) times daily. 06/26/16   Alison Murray, MD  aspirin EC 81 MG tablet Take 81 mg by mouth daily.    [provider]  budesonide (PULMICORT) 0.5 MG/2ML nebulizer solution Take 2 mLs (0.5 mg total) by nebulization 2 (two) times daily. 06/26/16   Alison Murray, MD  budesonide-formoterol [facility]) 160-4.5 MCG/ACT inhaler Inhale 2 puffs  into the lungs 2 (two) times daily. 05/13/16   Rai, Delene Ruffini, MD  Cholecalciferol (VITAMIN D) 2000 UNITS CAPS Take 2,000 Units by mouth daily.     [provider]  cyanocobalamin 1000 MCG tablet Take 1,000 mcg by mouth daily. Reported on 09/21/2015    [provider]  escitalopram (LEXAPRO) 5 MG tablet Take 5 mg by mouth at bedtime.  06/18/16   [provider]  feeding supplement, ENSURE ENLIVE, (ENSURE ENLIVE) LIQD Take 237 mLs by mouth 2 (two) times daily between meals. 06/26/16   Alison Murray, MD  guaiFENesin (MUCINEX) 600 MG 12 hr tablet Take 1 tablet (600 mg total) by mouth 2 (two) times daily as needed. 06/26/16   Alison Murray, MD  ipratropium-albuterol (DUONEB) 0.5-2.5 (3) MG/3ML SOLN Take 3 mLs by nebulization every 4 (four) hours as needed. 06/26/16   Alison Murray, MD  Krill Oil 1000 MG CAPS Take 1,000 mg by mouth daily.    [provider]  methocarbamol (ROBAXIN) 500 MG tablet Take 1 tablet (500 mg total) by mouth every 8 (eight) hours as needed for muscle spasms. Patient not taking: Reported on 06/19/2016 05/13/16   Rai, Delene Ruffini, MD  Multiple Vitamins-Minerals (PRESERVISION AREDS PO) Take 1 tablet by mouth 2 (two) times daily.     [provider]  rOPINIRole (REQUIP) 0.5 MG tablet Take 1 tablet (0.5 mg total) by mouth at bedtime. 06/26/16   Alison Murrayevine, Alma M, MD   Allergies No Known Allergies  Family History Family History  Problem Relation Age of Onset  . Coronary artery disease Father   . Coronary artery disease Brother 1954  . Coronary artery disease Brother 6162   Social History  reports that she has been smoking Cigarettes.  She has a 45.00 pack-year smoking history. She has never used smokeless tobacco. She reports that she does not drink alcohol or use drugs.  Review Of Systems:  Could not be obtained as pt is intubated and sedated.   Brief patient description:  83 y/o woman with COPD admitted with respiratory  failure  Subjective: Had issues with delirium overnight. She is calmer today morning and doing well on a weaning trial  Current Status:  Vital Signs: Temp:  [96.6 F (35.9 C)-100.4 F (38 C)] 98.8 F (37.1 C) (05/14 0830) Pulse Rate:  [75-100] 91 (05/14 0830) Resp:  [7-30] 25 (05/14 0830) BP: (88-128)/(41-64) 125/63 (05/14 0800) SpO2:  [94 %-100 %] 97 % (05/14 0830) FiO2 (%):  [40 %-50 %] 40 % (05/14 0827) Weight:  [102 lb 11.8 oz (46.6 kg)] 102 lb 11.8 oz (46.6 kg) (05/14 0500)  Physical Examination: Blood pressure 125/63, pulse 91, temperature 98.8 F (37.1 C), temperature source Core (Comment), resp. rate (!) 25, height 5\' 2"  (1.575 m), weight 102 lb 11.8 oz (46.6 kg), SpO2 97 %. Gen:      No acute distress HEENT:  EOMI, sclera anicteric, ETT in place Neck:     No masses; no thyromegaly Lungs:    Clear to auscultation bilaterally; normal respiratory effort CV:         Regular rate and rhythm; no murmurs Abd:      + bowel sounds; soft, non-tender; no palpable masses, no distension Ext:    No edema; adequate peripheral perfusion Skin:      Warm and dry; no rash Neuro: alert and oriented x 3 Psych: normal mood and affect  CBC Recent Labs     08/20/16  0013  08/20/16  0306  08/21/16  0545  WBC  20.6*  14.6*  12.2*  HGB  11.1*  9.9*  8.9*  HCT  35.8*  31.7*  27.5*  PLT  319  251  243    Coag's No results for input(s): APTT, INR in the last 72 hours.  BMET Recent Labs     08/20/16  0013  08/20/16  0306  08/21/16  0545  NA  138  137  140  K  4.3  4.1  3.5  CL  102  105  101  CO2  25  24  28   BUN  19  20  31*  CREATININE  0.98  0.95  1.25*  GLUCOSE  159*  197*  94    Electrolytes Recent Labs     08/20/16  0013  08/20/16  0306  08/21/16  0545  CALCIUM  9.2  8.1*  8.4*  MG   --   1.5*  2.5*  PHOS   --   5.2*  4.6    Sepsis Markers No results for input(s): PROCALCITON, O2SATVEN in the last 72 hours.  Invalid input(s): LACTICACIDVEN  ABG Recent  Labs     08/20/16  0312  08/20/16  0527  08/20/16  1025  PHART  7.188*  7.207*  7.351  PCO2ART  71.7*  69.1*  45.7  PO2ART  130.0*  117.0*  66.8*    Liver Enzymes Recent Labs     08/21/16  0545  AST  27  ALT  19  ALKPHOS  62  BILITOT  0.5  ALBUMIN  2.9*    Cardiac Enzymes Recent Labs     08/20/16  1803  08/20/16  2358  08/21/16  0545  TROPONINI  0.13*  0.13*  0.14*    Glucose No results for input(s): GLUCAP in the last 72 hours.  Imaging CXR 5/14 > ET tube in place. Improved bilateral infiltrates with pleural effusion. I reviewed the images personally.  Cultures: Blood 5/13 1/2 CNS  Antibiotics: Vanc 5/13---> Zosyn 5/13--->  Active Problems:   Acute respiratory failure (HCC)   Respiratory failure (HCC)   ASSESSMENT AND PLAN  PULMONARY A:Acute hypercarbic respiratory failure COPD, ex-smoker Likely COPD exacerbation in the setting of pulmonary edema P:   Continue Solu-Medrol at current dose Continue standing duonebs, albuterol when necessary Plan for extubation today She will need pulmonary follow-up on discharge  CARDIOVASCULAR A: a-flutter v fib with RVR > resolved Diastolic heart failure Moderate AS P:  Follow troponins and echo  RENAL A:  Volume overload AKI P:   Hold Lasix as creatinine is higher Follow urine output and electrolytes   HEMATOLOGIC A:  Anemia with no evidence of bleed. P:  Follow CBC  INFECTIOUS    A:  ? Pneumonia CNS staph 1/2 bottles. ? contaminant P: Continue antibiotics Resend Blood cultures Follow Pct  ENDOCRINE  A:  At risk for hypo/hyperglycemia   P:   Follow glucoose  NEUROLOGIC A:  No issues. P:    Hold sedation in anticipation of extubation  BEST PRACTICE / DISPOSITION Level of Care:  ICU Primary Service:  PCCM Consultants:   Code Status:  Full Diet:  NPO DVT Px:  lovenox GI Px:  pepcid Skin Integrity:  good Social / Family:  Daughter updated at bedside  The patient is  critically ill with multiple organ system failure and requires high complexity decision making for assessment and support, frequent evaluation and titration of therapies, advanced monitoring, review of radiographic studies and interpretation of complex data.   Critical Care Time devoted to patient care services, exclusive of separately billable procedures, described in this note is 45 minutes.   Chilton Greathouse MD [place] [facility] Pager [phone] If no answer or after 3pm call: [phone] 08/21/2016, 9:16 AM

## 2016-08-22 ENCOUNTER — Inpatient Hospital Stay (HOSPITAL_COMMUNITY): Payer: Medicare Other

## 2016-08-22 DIAGNOSIS — J9600 Acute respiratory failure, unspecified whether with hypoxia or hypercapnia: Secondary | ICD-10-CM

## 2016-08-22 DIAGNOSIS — I360 Nonrheumatic tricuspid (valve) stenosis: Secondary | ICD-10-CM

## 2016-08-22 LAB — CBC
HEMATOCRIT: 30.1 % — AB (ref 36.0–46.0)
Hemoglobin: 9.4 g/dL — ABNORMAL LOW (ref 12.0–15.0)
MCH: 32.2 pg (ref 26.0–34.0)
MCHC: 31.2 g/dL (ref 30.0–36.0)
MCV: 103.1 fL — AB (ref 78.0–100.0)
PLATELETS: 300 10*3/uL (ref 150–400)
RBC: 2.92 MIL/uL — ABNORMAL LOW (ref 3.87–5.11)
RDW: 14.1 % (ref 11.5–15.5)
WBC: 12.8 10*3/uL — ABNORMAL HIGH (ref 4.0–10.5)

## 2016-08-22 LAB — CULTURE, BLOOD (ROUTINE X 2): SPECIAL REQUESTS: ADEQUATE

## 2016-08-22 LAB — BASIC METABOLIC PANEL
Anion gap: 9 (ref 5–15)
BUN: 30 mg/dL — AB (ref 6–20)
CO2: 31 mmol/L (ref 22–32)
Calcium: 8.9 mg/dL (ref 8.9–10.3)
Chloride: 101 mmol/L (ref 101–111)
Creatinine, Ser: 0.93 mg/dL (ref 0.44–1.00)
GFR calc Af Amer: >60 mL/min (ref >60)
GFR, EST NON AFRICAN AMERICAN: 56 mL/min — AB (ref >60)
GLUCOSE: 95 mg/dL (ref 65–99)
Potassium: 4.2 mmol/L (ref 3.5–5.1)
Sodium: 141 mmol/L (ref 135–145)

## 2016-08-22 LAB — GLUCOSE, CAPILLARY
GLUCOSE-CAPILLARY: 199 mg/dL — AB (ref 65–99)
Glucose-Capillary: 89 mg/dL (ref 65–99)
Glucose-Capillary: 183 mg/dL — ABNORMAL HIGH (ref 65–99)
Glucose-Capillary: 163 mg/dL — ABNORMAL HIGH (ref 65–99)

## 2016-08-22 LAB — ECHOCARDIOGRAM COMPLETE
Height: 62 in
Weight: 1622.59 oz

## 2016-08-22 LAB — PROCALCITONIN: PROCALCITONIN: 0.39 ng/mL

## 2016-08-22 MED ORDER — IPRATROPIUM-ALBUTEROL 0.5-2.5 (3) MG/3ML IN SOLN
3.0000 mL | Freq: Three times a day (TID) | RESPIRATORY_TRACT | Status: DC
  Administered 2016-08-22 – 2016-08-24 (×8): 3 mL via RESPIRATORY_TRACT
  Filled 2016-08-22 (×9): qty 3

## 2016-08-22 MED ORDER — PREDNISONE 20 MG PO TABS
40.0000 mg | ORAL_TABLET | Freq: Every day | ORAL | Status: DC
  Administered 2016-08-22 – 2016-08-24 (×3): 40 mg via ORAL
  Filled 2016-08-22 (×3): qty 2

## 2016-08-22 MED ORDER — INSULIN ASPART 100 UNIT/ML ~~LOC~~ SOLN
0.0000 [IU] | SUBCUTANEOUS | Status: DC
  Administered 2016-08-22 (×3): 2 [IU] via SUBCUTANEOUS
  Administered 2016-08-23: 3 [IU] via SUBCUTANEOUS
  Administered 2016-08-23: 2 [IU] via SUBCUTANEOUS
  Administered 2016-08-23: 1 [IU] via SUBCUTANEOUS
  Administered 2016-08-24: 2 [IU] via SUBCUTANEOUS
  Administered 2016-08-24 (×2): 1 [IU] via SUBCUTANEOUS

## 2016-08-22 MED ORDER — ENSURE ENLIVE PO LIQD
237.0000 mL | Freq: Three times a day (TID) | ORAL | Status: DC
  Administered 2016-08-22 – 2016-08-23 (×3): 237 mL via ORAL

## 2016-08-22 NOTE — Progress Notes (Signed)
  Echocardiogram 2D Echocardiogram has been performed.  Arvil ChacoFoster, Nike Southers 08/22/2016, 3:10 PM

## 2016-08-22 NOTE — Progress Notes (Signed)
Name: Denise Adams MRN: [id number] DOB: 07-26-1931    LOS: 2  Referring Provider:  Dr. Patria Mane Reason for Referral:  Hypercarbic respiratory failure  PULMONARY / CRITICAL CARE MEDICINE  HPI:  History taken from EMR as pt is intubated and sedated and no family is at the bedside.  Ms. Denise Adams is an 83 y/o woman with a history of COPD, DM and HTN who had worsening shortness of breath and confusion prior to arrival in the ED.  She is on 2L O2 but had apparently run out.  She developed acute respiratory distress after arrival in the ED and was intubated.  Past Medical History:  Diagnosis Date  . Aortic stenosis   . COPD (chronic obstructive pulmonary disease) (HCC)   . Diabetes mellitus   . HLD (hyperlipidemia)   . HTN (hypertension)   . Myocardial infarction (HCC)   . PVD (peripheral vascular disease) (HCC)    Past Surgical History:  Procedure Laterality Date  . ABDOMINAL HYSTERECTOMY    . APPENDECTOMY    . CAROTID ENDARTERECTOMY     Left  . CATARACT EXTRACTION    . FOOT SURGERY     right  . TONSILLECTOMY     Prior to Admission medications   Medication Sig Start Date End Date Taking? Authorizing Provider  acetaminophen (TYLENOL) 325 MG tablet Take 2 tablets (650 mg total) by mouth every 6 (six) hours as needed for mild pain (or Fever >/= 101). 06/26/16   Alison Murray, MD  albuterol (PROVENTIL HFA;VENTOLIN HFA) 108 (90 BASE) MCG/ACT inhaler Inhale 2 puffs into the lungs every 6 (six) hours as needed for shortness of breath.     [provider]  arformoterol (BROVANA) 15 MCG/2ML NEBU Take 2 mLs (15 mcg total) by nebulization 2 (two) times daily. 06/26/16   Alison Murray, MD  aspirin EC 81 MG tablet Take 81 mg by mouth daily.    [provider]  budesonide (PULMICORT) 0.5 MG/2ML nebulizer solution Take 2 mLs (0.5 mg total) by nebulization 2 (two) times daily. 06/26/16   Alison Murray, MD  budesonide-formoterol [facility]) 160-4.5 MCG/ACT inhaler Inhale 2 puffs  into the lungs 2 (two) times daily. 05/13/16   Rai, Delene Ruffini, MD  Cholecalciferol (VITAMIN D) 2000 UNITS CAPS Take 2,000 Units by mouth daily.     [provider]  cyanocobalamin 1000 MCG tablet Take 1,000 mcg by mouth daily. Reported on 09/21/2015    [provider]  escitalopram (LEXAPRO) 5 MG tablet Take 5 mg by mouth at bedtime.  06/18/16   [provider]  feeding supplement, ENSURE ENLIVE, (ENSURE ENLIVE) LIQD Take 237 mLs by mouth 2 (two) times daily between meals. 06/26/16   Alison Murray, MD  guaiFENesin (MUCINEX) 600 MG 12 hr tablet Take 1 tablet (600 mg total) by mouth 2 (two) times daily as needed. 06/26/16   Alison Murray, MD  ipratropium-albuterol (DUONEB) 0.5-2.5 (3) MG/3ML SOLN Take 3 mLs by nebulization every 4 (four) hours as needed. 06/26/16   Alison Murray, MD  Krill Oil 1000 MG CAPS Take 1,000 mg by mouth daily.    [provider]  methocarbamol (ROBAXIN) 500 MG tablet Take 1 tablet (500 mg total) by mouth every 8 (eight) hours as needed for muscle spasms. Patient not taking: Reported on 06/19/2016 05/13/16   Rai, Delene Ruffini, MD  Multiple Vitamins-Minerals (PRESERVISION AREDS PO) Take 1 tablet by mouth 2 (two) times daily.     [provider]  rOPINIRole (REQUIP) 0.5 MG tablet Take 1 tablet (0.5 mg total) by mouth at bedtime. 06/26/16   Alison Murray, MD   Allergies No Known Allergies  Family History Family History  Problem Relation Age of Onset  . Coronary artery disease Father   . Coronary artery disease Brother 82  . Coronary artery disease Brother 54   Social History  reports that she has been smoking Cigarettes.  She has a 45.00 pack-year smoking history. She has never used smokeless tobacco. She reports that she does not drink alcohol or use drugs.  Review Of Systems:  Could not be obtained as pt is intubated and sedated.   Brief patient description:  83 y/o woman with COPD admitted with respiratory  failure  Subjective: Had issues with delirium overnight. She is calmer today morning and doing well on a weaning trial  Current Status:  Vital Signs: Temp:  [96.6 F (35.9 C)-99.9 F (37.7 C)] 98.5 F (36.9 C) (05/15 0400) Pulse Rate:  [76-111] 87 (05/15 0600) Resp:  [16-28] 19 (05/15 0600) BP: (113-140)/(54-80) 140/71 (05/15 0600) SpO2:  [92 %-100 %] 94 % (05/15 0600) FiO2 (%):  [40 %] 40 % (05/14 0827) Weight:  [101 lb 6.6 oz (46 kg)] 101 lb 6.6 oz (46 kg) (05/15 0338)  Physical Examination: Gen:      No acute distress HEENT:  EOMI, sclera anicteric Neck:     No masses; no thyromegaly Lungs:    Clear to auscultation bilaterally; normal respiratory effort CV:         Regular rate and rhythm; no murmurs Abd:      + bowel sounds; soft, non-tender; no palpable masses, no distension Ext:    No edema; adequate peripheral perfusion Skin:      Warm and dry; no rash Neuro: alert and oriented x 3 Psych: normal mood and affect  CBC Recent Labs     08/20/16  0306  08/21/16  0545  08/22/16  0552  WBC  14.6*  12.2*  12.8*  HGB  9.9*  8.9*  9.4*  HCT  31.7*  27.5*  30.1*  PLT  251  243  300    Coag's No results for input(s): APTT, INR in the last 72 hours.  BMET Recent Labs     08/20/16  0013  08/20/16  0306  08/21/16  0545  NA  138  137  140  K  4.3  4.1  3.5  CL  102  105  101  CO2  25  24  28   BUN  19  20  31*  CREATININE  0.98  0.95  1.25*  GLUCOSE  159*  197*  94    Electrolytes Recent Labs     08/20/16  0013  08/20/16  0306  08/21/16  0545  CALCIUM  9.2  8.1*  8.4*  MG   --   1.5*  2.5*  PHOS   --   5.2*  4.6    Sepsis Markers No results for input(s): PROCALCITON, O2SATVEN in the last 72 hours.  Invalid input(s): LACTICACIDVEN  ABG Recent Labs     08/20/16  0312  08/20/16  0527  08/20/16  1025  PHART  7.188*  7.207*  7.351  PCO2ART  71.7*  69.1*  45.7  PO2ART  130.0*  117.0*  66.8*    Liver Enzymes Recent Labs     08/21/16  0545   AST  27  ALT  19  ALKPHOS  62  BILITOT  0.5  ALBUMIN  2.9*    Cardiac Enzymes Recent Labs     08/20/16  1803  08/20/16  2358  08/21/16  0545  TROPONINI  0.13*  0.13*  0.14*    Glucose No results for input(s): GLUCAP in the last 72 hours.  Imaging CXR 5/14 > ET tube in place. Improved bilateral infiltrates with pleural effusion. I reviewed the images personally.  Cultures: Blood 5/13- 1/2 GPCs Blood 5/14- pending Sputum cx 5/14 - GPCs RVP 5/13- negative  Antibiotics: Vanc 5/13---> Zosyn 5/13--->  Active Problems:   Acute respiratory failure (HCC)   Respiratory failure (HCC)   ASSESSMENT AND PLAN  PULMONARY A:Acute hypercarbic respiratory failure COPD, ex-smoker Likely COPD exacerbation in the setting of pulmonary edema P:   Continue steroids. Change to prednisone 40 gm today and start taper over 1 week Continue standing duonebs and albuterol PRN Pulmonary follow up as outpt. We will make the appointment.  CARDIOVASCULAR A: a-flutter v fib with RVR > resolved Diastolic heart failure Moderate AS P:  Follow echo  RENAL A:  Volume overload AKI P:   Holding lasix as Cr was higher. BMP on 5/15 is pending. Follow urine output and electrolytes  HEMATOLOGIC A:  Anemia with no evidence of bleed. P:  Follow CBC  INFECTIOUS A:  ? Pneumonia GPC in blood and sputum P: Continue antibiotics Follow repeat Bcx Follow Pct  ENDOCRINE A:  Hyperglycemia P:   Start SSI coverage while on steroids  NEUROLOGIC A:  No issues. P:   Started on requip  BEST PRACTICE / DISPOSITION Level of Care:  Transfer to med surg Primary Service:  PCCM Consultants:   Code Status:  Full Diet:  Full DVT Px:  lovenox GI Px:  pepcid Skin Integrity:  good Social / Family:  Daughter updated at bedside  Chilton GreathousePraveen Tykira Wachs MD [place] [facility] Pager [phone] If no answer or after 3pm call: [phone] 08/22/2016, 7:04 AM

## 2016-08-23 ENCOUNTER — Telehealth: Payer: Self-pay

## 2016-08-23 ENCOUNTER — Telehealth: Payer: Self-pay | Admitting: Cardiology

## 2016-08-23 DIAGNOSIS — R531 Weakness: Secondary | ICD-10-CM

## 2016-08-23 DIAGNOSIS — R0609 Other forms of dyspnea: Secondary | ICD-10-CM

## 2016-08-23 DIAGNOSIS — Z515 Encounter for palliative care: Secondary | ICD-10-CM

## 2016-08-23 DIAGNOSIS — R0602 Shortness of breath: Secondary | ICD-10-CM

## 2016-08-23 DIAGNOSIS — J9601 Acute respiratory failure with hypoxia: Secondary | ICD-10-CM

## 2016-08-23 DIAGNOSIS — J9621 Acute and chronic respiratory failure with hypoxia: Secondary | ICD-10-CM

## 2016-08-23 DIAGNOSIS — Z7189 Other specified counseling: Secondary | ICD-10-CM

## 2016-08-23 LAB — CBC
HEMATOCRIT: 33.1 % — AB (ref 36.0–46.0)
HEMOGLOBIN: 10.1 g/dL — AB (ref 12.0–15.0)
MCH: 31.7 pg (ref 26.0–34.0)
MCHC: 30.5 g/dL (ref 30.0–36.0)
MCV: 103.8 fL — AB (ref 78.0–100.0)
Platelets: 367 10*3/uL (ref 150–400)
RBC: 3.19 MIL/uL — ABNORMAL LOW (ref 3.87–5.11)
RDW: 13.9 % (ref 11.5–15.5)
WBC: 12.6 10*3/uL — ABNORMAL HIGH (ref 4.0–10.5)

## 2016-08-23 LAB — BASIC METABOLIC PANEL
ANION GAP: 6 (ref 5–15)
BUN: 21 mg/dL — AB (ref 6–20)
CO2: 35 mmol/L — AB (ref 22–32)
Calcium: 9.1 mg/dL (ref 8.9–10.3)
Chloride: 98 mmol/L — ABNORMAL LOW (ref 101–111)
Creatinine, Ser: 0.71 mg/dL (ref 0.44–1.00)
GFR calc Af Amer: >60 mL/min (ref >60)
GFR calc non Af Amer: >60 mL/min (ref >60)
GLUCOSE: 98 mg/dL (ref 65–99)
POTASSIUM: 3.9 mmol/L (ref 3.5–5.1)
Sodium: 139 mmol/L (ref 135–145)

## 2016-08-23 LAB — GLUCOSE, CAPILLARY
GLUCOSE-CAPILLARY: 161 mg/dL — AB (ref 65–99)
Glucose-Capillary: 102 mg/dL — ABNORMAL HIGH (ref 65–99)
Glucose-Capillary: 148 mg/dL — ABNORMAL HIGH (ref 65–99)
Glucose-Capillary: 213 mg/dL — ABNORMAL HIGH (ref 65–99)
Glucose-Capillary: 91 mg/dL (ref 65–99)
Glucose-Capillary: 91 mg/dL (ref 65–99)

## 2016-08-23 LAB — MAGNESIUM: Magnesium: 1.8 mg/dL (ref 1.7–2.4)

## 2016-08-23 LAB — CULTURE, RESPIRATORY W GRAM STAIN: Special Requests: NORMAL

## 2016-08-23 LAB — CULTURE, RESPIRATORY

## 2016-08-23 LAB — PHOSPHORUS: Phosphorus: 2.6 mg/dL (ref 2.5–4.6)

## 2016-08-23 MED ORDER — PANTOPRAZOLE SODIUM 40 MG PO TBEC
40.0000 mg | DELAYED_RELEASE_TABLET | Freq: Every day | ORAL | Status: DC
  Administered 2016-08-23: 40 mg via ORAL
  Filled 2016-08-23: qty 1

## 2016-08-23 MED ORDER — ENOXAPARIN SODIUM 40 MG/0.4ML ~~LOC~~ SOLN
40.0000 mg | SUBCUTANEOUS | Status: DC
  Administered 2016-08-24: 40 mg via SUBCUTANEOUS
  Filled 2016-08-23: qty 0.4

## 2016-08-23 NOTE — Telephone Encounter (Signed)
New message    1 week TOC appt made with Dr. Swazil[place]Jordan on 5/24. Tiffany asked for another provider on team and APP's did not have appt.

## 2016-08-23 NOTE — Care Management Note (Addendum)
Case Management Note  Patient Details  Name: Denise Adams MRN: [id number] Date of Birth: 10/11/1931  Subjective/Objective:         CM following for progression and d/c planning. Per pt RN this pt is upset re her account, states that she has had several recent admissions and is working with collections re repayment however she and her daughter feel that they have been treated rudely and the pt states that she can not pay anymore than she is currently paying and is being pressured to do more. This CM contacted Financial Counsel[employer] and they will send a rep to speak to this pt. Pt and daughter were also advised to contact the Office of Patient Experience re their experience with the collectors.          Artistinancial Counselor in with pt and family and assisting in explaining pt financial issues. Pt will need HH had [facility] previously and wishes to use that agency again.    Action/Plan: Will follow for d/c needs. [facility] . Would like ongoing followup with [facility]. .  Will contact [facility].  Expected Discharge Date:                  Expected Discharge Plan:  Home w Home Health Services  In-House Referral:     Discharge planning Services  CM Consult  Post Acute Care Choice:  Home Health Choice offered to:  Patient  DME Arranged:    DME Agency:     HH Arranged:  RN HH Agency:     Status of Service:  In process, will continue to follow  If discussed at Long Length of Stay Meetings, dates discussed:    Additional Comments:  Starlyn SkeansRoyal, Larron Armor U, RN 08/23/2016, 10:15 AM

## 2016-08-23 NOTE — Progress Notes (Signed)
Pharmacy Antibiotic Note  Denise Adams is a 83 y.o. female admitted on 08/19/2016 with sepsis.  Pharmacy has been consulted for Vancomycin/Zosyn dosing.  WBC trended down, LA and PCT now normal. Cultures are negative so far, including negative MRSA PCR which has high specificity for negative MRSA PNA.  Plan: Vancomycin 500 mg IV q24h- consider stopping d/t negative MRSA PCR Zosyn 3.375G IV q8h to be infused over 4 hours Follow c/s, clinical progression, de-escalation/LOT, renal function  Height: 5\' 2"  (157.5 cm) Weight: 101 lb 6.6 oz (46 kg) IBW/kg (Calculated) : 50.1  Temp (24hrs), Avg:98.2 F (36.8 C), Min:98 F (36.7 C), Max:98.4 F (36.9 C)   Recent Labs Lab 08/20/16 0013 08/20/16 0022 08/20/16 0306 08/21/16 0545 08/22/16 0552 08/23/16 0720  WBC 20.6*  --  14.6* 12.2* 12.8* 12.6*  CREATININE 0.98  --  0.95 1.25* 0.93 0.71  LATICACIDVEN  --  0.97  --   --   --   --     Estimated Creatinine Clearance: 39.4 mL/min (by C-G formula based on SCr of 0.71 mg/dL).    No Known Allergies  Antimicrobials this admission:  5/13 vanc>> 5/13 zosyn>>  Microbiology results:  5/13 BCx: 1/2 GPC 5/13 BCID: CoNS 5/13 resp panel: neg 5/13 urine: neg 5/13 MRSA PCR: neg 5/14 BCx x 2: ngtd  5/14 TA: reincubated   Denise Adams D. Denise Adams, PharmD, BCPS Clinical Pharmacist Pager: [phone] Clinical Phone for 08/23/2016 until 3:30pm: [phone] If after 3:30pm, please call main pharmacy at [phone] 08/23/2016 11:23 AM

## 2016-08-23 NOTE — Consult Note (Signed)
Consultation Note Date: 08/23/2016   Patient Name: Denise Adams  DOB: Feb 23, 1932  MRN: [id number]  Age / Sex: 83 y.o., female  PCP: Chilton Greathouse, MD Referring Physician: Meredeth Ide, MD  Reason for Consultation: Establishing goals of care and Psychosocial/spiritual support  HPI/Patient Profile: 83 y.o. female   admitted on 08/19/2016 with  A past medical  history of COPD, DM and HTN, mild demntia who had worsening shortness of breath and confusion prior to arrival in the ED.  She is on 2L O2 but had apparently run out.  She developed acute respiratory distress after arrival in the ED and was intubated.  Cardiology consulted; severe aortic stenosis, diastolic heart failure, afib.  Decision to f/u as OP for possible TAVR procedure.  Patient has had continued physical, functional and cognitive decline over the past 6 months; multiple rehospitalization.  Patient and her family face treatment option decisions, advanced directive decisions and anticipatory care needs.   Clinical Assessment and Goals of Care:  This NP Lorinda Creed reviewed medical records, received report from team, assessed the patient and then meet at the patient's bedside along with her three daughters  to discuss diagnosis, prognosis, GOC, EOL wishes disposition and options.  A detailed discussion was had today regarding advanced directives.  Concepts specific to code status, artifical feeding and hydration, continued IV antibiotics and rehospitalization was had.  The difference between a aggressive medical intervention path  and a palliative comfort care path for this patient at this time was had.  Values and goals of care important to patient and family were attempted to be elicited.  MOST form introduced  Concept of Hospice and Palliative Care were discussed  Concepts specific to multiple co-morbid ites and failure to thrive  discussed.  Questions and concerns addressed.   Family encouraged to call with questions or concerns.  PMT will continue to support holistically.   HCPOA/ daughter Gerre Couch    SUMMARY OF RECOMMENDATIONS    Code Status/Advance Care Planning:  Full code   Symptom Management:   Dyspnea: Medical management   of chronic disease  Recommend Pulmonary f/u  as OP  Discussed smoking cessation, diet, exercise and life style and impact on disease   Additional Recommendations (Limitations, Scope, Preferences):  Full Scope Treatment  Psycho-social/Spiritual:   Desire for further Chaplaincy support:no  Additional Recommendations: Education on Hospice  Prognosis:   Unable to determine  Discharge Planning: To Be Determined      Primary Diagnoses: Present on Admission: . Acute respiratory failure (HCC) . Respiratory failure (HCC)   I have reviewed the medical record, interviewed the patient and family, and examined the patient. The following aspects are pertinent.  Past Medical History:  Diagnosis Date  . Aortic stenosis   . COPD (chronic obstructive pulmonary disease) (HCC)   . Diabetes mellitus   . HLD (hyperlipidemia)   . HTN (hypertension)   . Myocardial infarction (HCC)   . PVD (peripheral vascular disease) (HCC)    Social History   Social History  .  Marital status: Widowed    Spouse name: N/A  . Number of children: N/A  . Years of education: N/A   Occupational History  . Retired    Social History Main Topics  . Smoking status: Current Every Day Smoker    Packs/day: 0.75    Years: 60.00    Types: Cigarettes  . Smokeless tobacco: Never Used  . Alcohol use No  . Drug use: No  . Sexual activity: Not Asked   Other Topics Concern  . None   Social History Narrative  . None   Family History  Problem Relation Age of Onset  . Coronary artery disease Father   . Coronary artery disease Brother [phone]  . Coronary artery disease Brother [phone]   Scheduled  Meds: . aspirin EC  81 mg Oral Daily  . [START ON 08/24/2016] enoxaparin (LOVENOX) injection  40 mg Subcutaneous Q24H  . feeding supplement (ENSURE ENLIVE)  237 mL Oral TID BM  . insulin aspart  0-9 Units Subcutaneous Q4H  . ipratropium-albuterol  3 mL Nebulization TID  . mouth rinse  15 mL Mouth Rinse BID  . pantoprazole  40 mg Oral QHS  . predniSONE  40 mg Oral Q breakfast  . rOPINIRole  0.5 mg Oral QHS   Continuous Infusions: . sodium chloride    . piperacillin-tazobactam (ZOSYN)  IV 3.375 g (08/23/16 1025)  . vancomycin Stopped (08/23/16 0447)   PRN Meds:.sodium chloride, acetaminophen (TYLENOL) oral liquid 160 mg/5 mL, albuterol, bisacodyl, sennosides Medications Prior to Admission:  Prior to Admission medications   Medication Sig Start Date End Date Taking? Authorizing Provider  acetaminophen (TYLENOL) 325 MG tablet Take 2 tablets (650 mg total) by mouth every 6 (six) hours as needed for mild pain (or Fever >/= 101). 06/26/16  Yes Alison Murrayevine, Alma M, MD  albuterol (PROVENTIL HFA;VENTOLIN HFA) 108 (90 BASE) MCG/ACT inhaler Inhale 2 puffs into the lungs every 6 (six) hours as needed for shortness of breath.    Yes [provider]  arformoterol (BROVANA) 15 MCG/2ML NEBU Take 2 mLs (15 mcg total) by nebulization 2 (two) times daily. 06/26/16  Yes Alison Murrayevine, Alma M, MD  aspirin EC 81 MG tablet Take 81 mg by mouth daily.   Yes [provider]  budesonide (PULMICORT) 0.5 MG/2ML nebulizer solution Take 2 mLs (0.5 mg total) by nebulization 2 (two) times daily. 06/26/16  Yes Alison Murrayevine, Alma M, MD  budesonide-formoterol [facility](SYMBICORT) 160-4.5 MCG/ACT inhaler Inhale 2 puffs into the lungs 2 (two) times daily. 05/13/16  Yes Rai, Ripudeep K, MD  Cholecalciferol (VITAMIN D) 2000 UNITS CAPS Take 2,000 Units by mouth daily.    Yes [provider]  cyanocobalamin 1000 MCG tablet Take 1,000 mcg by mouth daily. Reported on 09/21/2015   Yes [provider]  escitalopram (LEXAPRO) 5 MG  tablet Take 5 mg by mouth at bedtime.  06/18/16  Yes [provider]  feeding supplement, ENSURE ENLIVE, (ENSURE ENLIVE) LIQD Take 237 mLs by mouth 2 (two) times daily between meals. 06/26/16  Yes Alison Murrayevine, Alma M, MD  hydrochlorothiazide (HYDRODIURIL) 25 MG tablet Take 25 mg by mouth daily. 05/27/16  Yes [provider]  HYDROcodone-acetaminophen (NORCO) 7.5-325 MG tablet Take 2 tablets by mouth 2 (two) times daily. 07/07/16  Yes [provider]  Ibuprofen-Diphenhydramine HCl (IBUPROFEN PM) 200-25 MG CAPS Take 1 capsule by mouth at bedtime as needed (sleep).   Yes [provider]  ipratropium-albuterol (DUONEB) 0.5-2.5 (3) MG/3ML SOLN Take 3 mLs by nebulization every 4 (  four) hours as needed. 06/26/16  Yes Alison Murray, MD  LORazepam (ATIVAN) 0.5 MG tablet Take 0.5 mg by mouth every 8 (eight) hours as needed for anxiety.  07/28/16  Yes [provider]  Multiple Vitamins-Minerals (PRESERVISION AREDS PO) Take 1 tablet by mouth 2 (two) times daily.    Yes [provider]  ondansetron (ZOFRAN) 4 MG tablet Take 4 mg by mouth every 6 (six) hours as needed. for nausea 07/29/16  Yes [provider]  pantoprazole (PROTONIX) 40 MG tablet Take 40 mg by mouth every evening. 06/18/16  Yes [provider]  rOPINIRole (REQUIP) 0.5 MG tablet Take 1 tablet (0.5 mg total) by mouth at bedtime. 06/26/16  Yes Alison Murray, MD  guaiFENesin (MUCINEX) 600 MG 12 hr tablet Take 1 tablet (600 mg total) by mouth 2 (two) times daily as needed. 06/26/16   Alison Murray, MD  Krill Oil 1000 MG CAPS Take 1,000 mg by mouth daily.    [provider]  methocarbamol (ROBAXIN) 500 MG tablet Take 1 tablet (500 mg total) by mouth every 8 (eight) hours as needed for muscle spasms. Patient not taking: Reported on 06/19/2016 05/13/16   Cathren Harsh, MD   No Known Allergies Review of Systems  Constitutional: Positive for activity change and fatigue.  Respiratory:  Positive for shortness of breath.   Neurological: Positive for weakness.    Physical Exam  Constitutional: She appears cachectic. She appears ill.  -pleasantly confused  HENT:  Mouth/Throat: Oropharynx is clear and moist.  Cardiovascular: Tachycardia present.   Pulmonary/Chest: She has decreased breath sounds in the right lower field and the left lower field.  Neurological: She is alert.  Skin: Skin is warm and dry. Ecchymosis noted.    Vital Signs: BP 135/72 (BP Location: Right Arm)   Pulse 100   Temp 98.4 F (36.9 C) (Oral)   Resp 18   Ht 5\' 2"  (1.575 m)   Wt 46 kg (101 lb 6.6 oz)   SpO2 98%   BMI 18.55 kg/m  Pain Assessment: No/denies pain   Pain Score: 0-No pain   SpO2: SpO2: 98 % O2 Device:SpO2: 98 % O2 Flow Rate: .O2 Flow Rate (L/min): 2 L/min  IO: Intake/output summary:  Intake/Output Summary (Last 24 hours) at 08/23/16 1458 Last data filed at 08/23/16 0900  Gross per 24 hour  Intake              680 ml  Output              250 ml  Net              430 ml    LBM: Last BM Date: 08/23/16 Baseline Weight: Weight: 44 kg (97 lb) Most recent weight: Weight: 46 kg (101 lb 6.6 oz)      Palliative Assessment/Data: 30 %   Discussed with Dr Sharl Ma  Time In: 1300 Time Out: 1415 Time Total: 75 min Greater than 50%  of this time was spent counseling and coordinating care related to the above assessment and plan.  Signed by: Lorinda Creed, NP   Please contact Palliative Medicine Team phone at [phone] for questions and concerns.  For individual provider: See Loretha Stapler

## 2016-08-23 NOTE — Progress Notes (Signed)
[facility]  PROGRESS NOTE  Denise Adams ZOX:[id number] DOB: May 28, 1931 DOA: 08/19/2016 PCP: Chilton GreathouseAvva, Ravisankar, MD   Brief HPI:    83 year old female with a history of COPD, diabetes mellitus, hypertension who had worsening shortness of breath and confusion prior to arrival in the ED. Patient is on 2 L oxygen at home but had apparently run out. She developed acute respiratory distress after arrival in the ED and was intubated. Patient was under the care of PC CM. Patient was extubated and transferred to medical floor.   Tried assumed care on 08/23/2016.   Subjective   This morning patient is breathing better. Currently on 2 L of oxygen via nasal cannula which is at baseline.   Assessment/Plan:     1. Acute hypoxic respiratory failure- improving, currently patient at baseline. Likely from COPD exacerbation in setting of pulmonary edema. Patient was started on steroids and currently on prednisone taper. Continue DuoNeb nebs and albuterol when necessary. Also patient will need Lasix for pulmonary edema. [facility]. She will need follow-up with pulmonary as outpatient 2. Acute on chronic diastolic CHF-patient will need IV Lasix. [facility]. 3. Severe aortic stenosis-echocardiogram showed severe aortic stenosis, patient might need TAVR. [facility]. 4. Atrial flutter with RVR- resolved, currently patient is in  normal sinus rhythm. Cardiology consulted as above. 5. Coagulase negative staph bacteremia- blood cultures done on 09/06/2016 showed coagulase negative staph in 1 out of 2 bottles, repeat blood cultures are negative to date. Consider discontinuing vancomycin days if cultures remain negative. 6. Hyperglycemia- patient started on sliding scale insulin with NovoLog as currently she is on prednisone taper    DVT prophylaxis: Lovenox  Code Status: Full  code  Family Communication: Discussed with patient's daughter at bedside   Disposition Plan: Pending cardiology and palliative care consultation   Consultants: PC CM  Procedures:  Endotracheal intubation  Continuous infusions . sodium chloride    . piperacillin-tazobactam (ZOSYN)  IV 3.375 g (08/23/16 1025)  . vancomycin Stopped (08/23/16 0447)      Antibiotics:   Anti-infectives    Start     Dose/Rate Route Frequency Ordered Stop   08/21/16 0200  vancomycin (VANCOCIN) 500 mg in sodium chloride 0.9 % 100 mL IVPB     500 mg 100 mL/hr over 60 Minutes Intravenous Every 24 hours 08/20/16 0238     08/20/16 1000  piperacillin-tazobactam (ZOSYN) IVPB 3.375 g     3.375 g 12.5 mL/hr over 240 Minutes Intravenous Every 8 hours 08/20/16 0238     08/20/16 0130  piperacillin-tazobactam (ZOSYN) IVPB 3.375 g     3.375 g 100 mL/hr over 30 Minutes Intravenous  Once 08/20/16 0121 08/20/16 0248   08/20/16 0130  vancomycin (VANCOCIN) IVPB 1000 mg/200 mL premix     1,000 mg 200 mL/hr over 60 Minutes Intravenous  Once 08/20/16 0121 08/20/16 0248       Objective   Vitals:   08/23/16 0423 08/23/16 0857 08/23/16 0900 08/23/16 1515  BP: (!) 151/68  135/72   Pulse: 85  100   Resp: 18  18   Temp: 98.2 F (36.8 C)  98.4 F (36.9 C)   TempSrc: Oral  Oral   SpO2: 97% 93% 98% 93%  Weight:      Height:        Intake/Output Summary (Last 24 hours) at 08/23/16 1657 Last data filed at 08/23/16 1600  Gross per 24 hour  Intake  950 ml  Output              250 ml  Net              700 ml   Filed Weights   08/20/16 0400 08/21/16 0500 08/22/16 0338  Weight: 50.3 kg (110 lb 14.3 oz) 46.6 kg (102 lb 11.8 oz) 46 kg (101 lb 6.6 oz)     Physical Examination:   Physical Exam: Eyes: No icterus, extraocular muscles intact  Mouth: Oral mucosa is moist, no lesions on palate,  Neck: Supple, no deformities, masses, or tenderness Lungs: Normal respiratory effort, decreased breath  sounds bilaterally Heart: Regular rate and rhythm, S1 and S2 normal, no murmurs, rubs auscultated Abdomen: BS normoactive,soft,nondistended,non-tender to palpation,no organomegaly Extremities: No pretibial edema, no erythema, no cyanosis, no clubbing Neuro : Alert and oriented to time, place and person, No focal deficits  Skin: No rashes seen on exam     Data Reviewed: I have personally reviewed following labs and imaging studies  CBG:  Recent Labs Lab 08/22/16 2014 08/23/16 0004 08/23/16 0419 08/23/16 0729 08/23/16 1113  GLUCAP 163* 91 91 102* 148*    CBC:  Recent Labs Lab 08/20/16 0013 08/20/16 0306 08/21/16 0545 08/22/16 0552 08/23/16 0720  WBC 20.6* 14.6* 12.2* 12.8* 12.6*  NEUTROABS 11.8*  --   --   --   --   HGB 11.1* 9.9* 8.9* 9.4* 10.1*  HCT 35.8* 31.7* 27.5* 30.1* 33.1*  MCV 105.9* 106.4* 101.9* 103.1* 103.8*  PLT 319 251 243 300 367    Basic Metabolic Panel:  Recent Labs Lab 08/20/16 0013 08/20/16 0306 08/21/16 0545 08/22/16 0552 08/23/16 0720  NA 138 137 140 141 139  K 4.3 4.1 3.5 4.2 3.9  CL 102 105 101 101 98*  CO2 25 24 28 31  35*  GLUCOSE 159* 197* 94 95 98  BUN 19 20 31* 30* 21*  CREATININE 0.98 0.95 1.25* 0.93 0.71  CALCIUM 9.2 8.1* 8.4* 8.9 9.1  MG  --  1.5* 2.5*  --  1.8  PHOS  --  5.2* 4.6  --  2.6    Recent Results (from the past 240 hour(s))  Blood culture (routine x 2)     Status: Abnormal   Collection Time: 08/20/16 12:01 AM  Result Value Ref Range Status   Specimen Description BLOOD LEFT ARM  Final   Special Requests   Final    BOTTLES DRAWN AEROBIC ONLY Blood Culture adequate volume   Culture  Setup Time   Final    GRAM POSITIVE COCCI IN CLUSTERS AEROBIC BOTTLE ONLY Organism ID to follow CRITICAL RESULT CALLED TO, READ BACK BY AND VERIFIED WITH: A JOHNSTON 08/21/16 @ 0811 M VESTAL    Culture (A)  Final    STAPHYLOCOCCUS SPECIES (COAGULASE NEGATIVE) THE SIGNIFICANCE OF ISOLATING THIS ORGANISM FROM A SINGLE SET OF  BLOOD CULTURES WHEN MULTIPLE SETS ARE DRAWN IS UNCERTAIN. PLEASE NOTIFY THE MICROBIOLOGY DEPARTMENT WITHIN ONE WEEK IF SPECIATION AND SENSITIVITIES ARE REQUIRED.    Report Status 08/22/2016 FINAL  Final  Blood Culture ID Panel (Reflexed)     Status: Abnormal   Collection Time: 08/20/16 12:01 AM  Result Value Ref Range Status   Enterococcus species NOT DETECTED NOT DETECTED Final   Listeria monocytogenes NOT DETECTED NOT DETECTED Final   Staphylococcus species DETECTED (A) NOT DETECTED Final    Comment: Methicillin (oxacillin) resistant coagulase negative staphylococcus. Possible blood culture contaminant (unless isolated from more than one blood culture draw  or clinical case suggests pathogenicity). No antibiotic treatment is indicated for blood  culture contaminants. CRITICAL RESULT CALLED TO, READ BACK BY AND VERIFIED WITH: A JOHNSTON 08/21/16 @ 0811 M VESTAL    Staphylococcus aureus NOT DETECTED NOT DETECTED Final   Methicillin resistance DETECTED (A) NOT DETECTED Final    Comment: CRITICAL RESULT CALLED TO, READ BACK BY AND VERIFIED WITH: A JOHNSTON 08/21/16 @ 0811 M VESTAL    Streptococcus species NOT DETECTED NOT DETECTED Final   Streptococcus agalactiae NOT DETECTED NOT DETECTED Final   Streptococcus pneumoniae NOT DETECTED NOT DETECTED Final   Streptococcus pyogenes NOT DETECTED NOT DETECTED Final   Acinetobacter baumannii NOT DETECTED NOT DETECTED Final   Enterobacteriaceae species NOT DETECTED NOT DETECTED Final   Enterobacter cloacae complex NOT DETECTED NOT DETECTED Final   Escherichia coli NOT DETECTED NOT DETECTED Final   Klebsiella oxytoca NOT DETECTED NOT DETECTED Final   Klebsiella pneumoniae NOT DETECTED NOT DETECTED Final   Proteus species NOT DETECTED NOT DETECTED Final   Serratia marcescens NOT DETECTED NOT DETECTED Final   Haemophilus influenzae NOT DETECTED NOT DETECTED Final   Neisseria meningitidis NOT DETECTED NOT DETECTED Final   Pseudomonas aeruginosa NOT  DETECTED NOT DETECTED Final   Candida albicans NOT DETECTED NOT DETECTED Final   Candida glabrata NOT DETECTED NOT DETECTED Final   Candida krusei NOT DETECTED NOT DETECTED Final   Candida parapsilosis NOT DETECTED NOT DETECTED Final   Candida tropicalis NOT DETECTED NOT DETECTED Final  Blood culture (routine x 2)     Status: None (Preliminary result)   Collection Time: 08/20/16 12:30 AM  Result Value Ref Range Status   Specimen Description BLOOD LEFT HAND  Final   Special Requests IN PEDIATRIC BOTTLE Blood Culture adequate volume  Final   Culture NO GROWTH 3 DAYS  Final   Report Status PENDING  Incomplete  Urine culture     Status: None   Collection Time: 08/20/16 12:56 AM  Result Value Ref Range Status   Specimen Description URINE, CATHETERIZED  Final   Special Requests NONE  Final   Culture NO GROWTH  Final   Report Status 08/21/2016 FINAL  Final  MRSA PCR Screening     Status: None   Collection Time: 08/20/16  3:30 AM  Result Value Ref Range Status   MRSA by PCR NEGATIVE NEGATIVE Final    Comment:        The GeneXpert MRSA Assay (FDA approved for NASAL specimens only), is one component of a comprehensive MRSA colonization surveillance program. It is not intended to diagnose MRSA infection nor to guide or monitor treatment for MRSA infections.   Respiratory Panel by PCR     Status: None   Collection Time: 08/20/16  6:47 PM  Result Value Ref Range Status   Adenovirus NOT DETECTED NOT DETECTED Final   Coronavirus 229E NOT DETECTED NOT DETECTED Final   Coronavirus HKU1 NOT DETECTED NOT DETECTED Final   Coronavirus NL63 NOT DETECTED NOT DETECTED Final   Coronavirus OC43 NOT DETECTED NOT DETECTED Final   Metapneumovirus NOT DETECTED NOT DETECTED Final   Rhinovirus / Enterovirus NOT DETECTED NOT DETECTED Final   Influenza A NOT DETECTED NOT DETECTED Final   Influenza B NOT DETECTED NOT DETECTED Final   Parainfluenza Virus 1 NOT DETECTED NOT DETECTED Final   Parainfluenza  Virus 2 NOT DETECTED NOT DETECTED Final   Parainfluenza Virus 3 NOT DETECTED NOT DETECTED Final   Parainfluenza Virus 4 NOT DETECTED NOT DETECTED Final  Respiratory Syncytial Virus NOT DETECTED NOT DETECTED Final   Bordetella pertussis NOT DETECTED NOT DETECTED Final   Chlamydophila pneumoniae NOT DETECTED NOT DETECTED Final   Mycoplasma pneumoniae NOT DETECTED NOT DETECTED Final  Culture, respiratory (NON-Expectorated)     Status: None   Collection Time: 08/21/16  1:34 AM  Result Value Ref Range Status   Specimen Description TRACHEAL ASPIRATE  Final   Special Requests Normal  Final   Gram Stain   Final    FEW WBC PRESENT,BOTH PMN AND MONONUCLEAR RARE GRAM POSITIVE COCCI IN PAIRS IN CLUSTERS    Culture FEW CANDIDA ALBICANS  Final   Report Status 08/23/2016 FINAL  Final  Culture, blood (Routine X 2) w Reflex to ID Panel     Status: None (Preliminary result)   Collection Time: 08/21/16 12:30 PM  Result Value Ref Range Status   Specimen Description BLOOD RIGHT HAND  Final   Special Requests IN PEDIATRIC BOTTLE Blood Culture adequate volume  Final   Culture NO GROWTH 2 DAYS  Final   Report Status PENDING  Incomplete  Culture, blood (Routine X 2) w Reflex to ID Panel     Status: None (Preliminary result)   Collection Time: 08/21/16 12:40 PM  Result Value Ref Range Status   Specimen Description BLOOD RIGHT HAND  Final   Special Requests IN PEDIATRIC BOTTLE Blood Culture adequate volume  Final   Culture NO GROWTH 2 DAYS  Final   Report Status PENDING  Incomplete     Liver Function Tests:  Recent Labs Lab 08/21/16 0545  AST 27  ALT 19  ALKPHOS 62  BILITOT 0.5  PROT 5.5*  ALBUMIN 2.9*   No results for input(s): LIPASE, AMYLASE in the last 168 hours. No results for input(s): AMMONIA in the last 168 hours.  Cardiac Enzymes:  Recent Labs Lab 08/20/16 0013 08/20/16 1803 08/20/16 2358 08/21/16 0545  TROPONINI 0.11* 0.13* 0.13* 0.14*   BNP (last 3 results)  Recent  Labs  05/11/16 1621 06/19/16 1920 08/20/16 0013  BNP 439.1* 285.3* 2,074.8*    ProBNP (last 3 results) No results for input(s): PROBNP in the last 8760 hours.    Studies: Dg Chest Port 1 View  Result Date: 08/22/2016 CLINICAL DATA:  83 year old female with respiratory failure post extubation. Subsequent encounter. EXAM: PORTABLE CHEST 1 VIEW COMPARISON:  08/21/2016. FINDINGS: Endotracheal tube and nasogastric tube removed. Mild pulmonary edema superimposed upon chronic changes. Persistent consolidation retrocardiac region may represent atelectasis or infiltrate. Recommend follow-up until clearance. Biapical parenchymal changes greater on left without associated bony destruction. Findings have progressed slightly compared to 2011 exam. Stability can be assessed on follow-up. Calcified aorta. IMPRESSION: Endotracheal tube and nasogastric tube and been removed. Pulmonary edema superimposed upon chronic changes once again noted. Small pleural effusions suspected. Retrocardiac opacity may represent atelectasis or infiltrate. Biapical parenchymal changes suggestive of scar have progressed slightly since 2011 exam. Stability can be confirmed on follow-up. Electronically Signed   By: Lacy Duverney M.D.   On: 08/22/2016 07:39    Scheduled Meds: . aspirin EC  81 mg Oral Daily  . [START ON 08/24/2016] enoxaparin (LOVENOX) injection  40 mg Subcutaneous Q24H  . feeding supplement (ENSURE ENLIVE)  237 mL Oral TID BM  . insulin aspart  0-9 Units Subcutaneous Q4H  . ipratropium-albuterol  3 mL Nebulization TID  . mouth rinse  15 mL Mouth Rinse BID  . pantoprazole  40 mg Oral QHS  . predniSONE  40 mg Oral Q breakfast  .  rOPINIRole  0.5 mg Oral QHS      Time spent: 25 min  [facility]   [facility] Pager [phone]. If 7PM-7AM, please contact night-coverage at [email or web address], Office  [phone]  password [email or web address] 08/23/2016, 4:57 PM  LOS: 3 days

## 2016-08-23 NOTE — Telephone Encounter (Signed)
Pt is still currently admitted.  Will call after discharge to schedule HFU.

## 2016-08-23 NOTE — Consult Note (Signed)
   [facility] Consult   08/23/2016  Rennis ChrisKatherine M Wyman 1931/09/07 [id number]   Referral received. Patient is a Ambulance personbeneficiary of [employer] Medicare.  Came by to speak with patient and Corrie DandyMary from  Palliative Care came in to meet with the patient and family.  Will follow for referral needs. Updated inpatient RNCM of attempt to see the patient.  Charlesetta ShanksVictoria Shai Rasmussen, RN BSN CCM [facility] Liaison  [phone] business mobile phone Toll free office [phone]

## 2016-08-23 NOTE — Consult Note (Signed)
Cardiology Consultation:   Patient ID: Denise Adams; [id number]; 21-May-1931   Admit date: 08/19/2016 Date of Consult: 08/23/2016  Primary Care Provider: Chilton Greathouse, MD Primary Cardiologist:: Jens Som (2013) Primary Electrophysiologist:  None   Patient Profile:   Denise Adams is a 83 y.o. female with a hx of 0.75 PPD smoker (no longer smokes) with hypertension, hyperlipidemia, myocardial infarction and CAD (per chart but I can not find cath report to support CAD or MI diagnoses and pt family deny), diabetes, COPD (not consistently complaint with home O2), < 40% stenosis on Carotid Dopplers and previous left carotid endarterectomy site was patent (2015),  Myoview (2012) EF 76% w/ small reversible apical defect, Abdominal ultrasound (2012) showed mild dilatation measuring 2.6 x 2.7 cm with  severe in-stent restenosis of the right iliac stent, protein malnourished,who is being seen today for the evaluation of heart failure and severe aortic stenosis at the request of Dr. Sharl Ma.   History of Present Illness:   Denise Adams presented to the emergency department on 5/12 with severe SOB. She had become increasingly short of breath over a couple of days and had run out of her home O2, she eventually become confused per family member. She quickly declined after arrival in the ER,  felt to be due to flash pulmonary edema and she was intubated for her respiratory failure. Chest xray revealed pulmonary edema. WBC 20, tachycardic at 140 and a code sepsis was called and she was admitted to the ICU.   On 5/13 Critical Care recorded a-flutter v fib with RVR or questionable CHF, she converted spontaneously. She was started on lasix for pulmonary edema and diuresed well. Ttroponins are flat with peak trop at 0.14. Her lasix was discontinued on 5/15 reportedly due to creatinine elevation (1.25 --> 0.71 today) and she was extubated, now on nasal cannula.  Echocardiogram (08/23/2016) LVEF  50-55% with basal inferior hypokinesis, tethering of the posterior mitral leaflet with moderate poerteiorly directed MR. Severe aortic valve stenosis with AVA 0.6cm2 and mean gradient gradient of 40 mmHg, unable to assess LV function).  Net diuresis is - 685.4,  BP range 137/69-- 151/68, Na 139, K 3.9 Admission weight 110 and today 101.  Palliative/hospice care is currently in room during the consult. The patient is visibly short of breath and having a difficult time saying more than 2-3 words. The family is all present and they are reviewing goals of care. She admits she is not feeling well. Denies any CP or remembering and palpitations.  Past Medical History:  Diagnosis Date  . Aortic stenosis   . COPD (chronic obstructive pulmonary disease) (HCC)   . Diabetes mellitus   . HLD (hyperlipidemia)   . HTN (hypertension)   . Myocardial infarction (HCC)   . PVD (peripheral vascular disease) (HCC)     Past Surgical History:  Procedure Laterality Date  . ABDOMINAL HYSTERECTOMY    . APPENDECTOMY    . CAROTID ENDARTERECTOMY     Left  . CATARACT EXTRACTION    . FOOT SURGERY     right  . TONSILLECTOMY       Inpatient Medications: Scheduled Meds: . aspirin EC  81 mg Oral Daily  . enoxaparin (LOVENOX) injection  30 mg Subcutaneous Q24H  . feeding supplement (ENSURE ENLIVE)  237 mL Oral TID BM  . insulin aspart  0-9 Units Subcutaneous Q4H  . ipratropium-albuterol  3 mL Nebulization TID  . mouth rinse  15 mL Mouth Rinse BID  . pantoprazole  40 mg Oral QHS  . predniSONE  40 mg Oral Q breakfast  . rOPINIRole  0.5 mg Oral QHS   Continuous Infusions: . sodium chloride    . piperacillin-tazobactam (ZOSYN)  IV 3.375 g (08/23/16 1025)  . vancomycin Stopped (08/23/16 0447)   PRN Meds: sodium chloride, acetaminophen (TYLENOL) oral liquid 160 mg/5 mL, albuterol, bisacodyl, sennosides  Allergies:   No Known Allergies  Social History:   Social History   Social History  . Marital status:  Widowed    Spouse name: N/A  . Number of children: N/A  . Years of education: N/A   Occupational History  . Retired    Social History Main Topics  . Smoking status: Current Every Day Smoker    Packs/day: 0.75    Years: 60.00    Types: Cigarettes  . Smokeless tobacco: Never Used  . Alcohol use No  . Drug use: No  . Sexual activity: Not on file   Other Topics Concern  . Not on file   Social History Narrative  . No narrative on file    Family History:   The patient's family history includes Coronary artery disease in her father; Coronary artery disease (age of onset: 73) in her brother; Coronary artery disease (age of onset: 24) in her brother.  ROS:  Please see the history of present illness.  All other ROS reviewed and negative.     Physical Exam/Data:   Vitals:   08/22/16 2026 08/23/16 0423 08/23/16 0857 08/23/16 0900  BP:  (!) 151/68  135/72  Pulse:  85  100  Resp:  18  18  Temp:  98.2 F (36.8 C)  98.4 F (36.9 C)  TempSrc:  Oral  Oral  SpO2: 97% 97% 93% 98%  Weight:      Height:        Intake/Output Summary (Last 24 hours) at 08/23/16 1326 Last data filed at 08/23/16 0900  Gross per 24 hour  Intake              680 ml  Output              250 ml  Net              430 ml   Filed Weights   08/20/16 0400 08/21/16 0500 08/22/16 0338  Weight: 110 lb 14.3 oz (50.3 kg) 102 lb 11.8 oz (46.6 kg) 101 lb 6.6 oz (46 kg)   Body mass index is 18.55 kg/m.  General: Well developed and in no acute distress. + Malnourished  Head: Normocephalic, atraumatic, sclera non-icteric, no xanthomas, nares are without discharge.  Neck: Negative for carotid bruits. JVD not elevated. Lungs: Clear bilaterally to auscultation without wheezes, rales, or rhonchi. Breathing is unlabored. Heart: Tachycardic with harsh sounding systolic murmur S1 and S2 are normal. No rubs, or gallops appreciated. Abdomen: Soft, non-tender, non-distended with normoactive bowel sounds. No hepatomegaly.  No rebound/guarding. No obvious abdominal masses. Msk:  Strength and tone appear decreased for age Extremities: No clubbing or cyanosis. No edema.  Distal pedal pulses are 2+ and equal bilaterally. Neuro: Alert and oriented X 3. No facial asymmetry. No focal deficit. Moves all extremities spontaneously. Psych:  Responds to questions appropriately with a normal affect.  EKG:  The EKG was personally reviewed and demonstrates sinus tachycardia at rate 132  Relevant CV Studies: ------------------------------------------------------------------- Study Conclusions  - Left ventricle: The cavity size was normal. Wall thickness was   normal. Systolic function was normal. The estimated  ejection   fraction was in the range of 50% to 55%. Basal inferior   hypokinesis. The study is not technically sufficient to allow   evaluation of LV diastolic function. - Aortic valve: Calcified with leaflet restriction. There is severe   stenosis. No significant regurgitation. Mean gradient (S): 39 mm   Hg. Peak gradient (S): 68 mm Hg. Valve area (Vmax): 0.67 cm^2.   Valve area (Vmean): 0.6 cm^2. - Mitral valve: Sclerotic and tethered posterior leaflet. There is   moderate posteriorly-directed mitral regurgitation. Calcified   annulus. - Left atrium: Moderately dilated. - Tricuspid valve: There was mild regurgitation. - Pulmonary arteries: PA peak pressure: 27 mm Hg (S). - Systemic veins: The IVC measures <2.1 cm, but does not collapse   >50%, suggesting an elevated RA pressure of 8 mmHg. - Pericardium, extracardiac: A trivial pericardial effusion was   identified posterior to the heart. Features were not consistent   with tamponade physiology.  Impressions:  - Compared to a prior study in 2013, the LVEF is lower at 50-55%   with basal inferior hypokinesis, there is tethering of the   posterior mitral leaflet with moderate posteriorly directed MR.   There is now severe aortic valve stenosis with AVA of  0.6 cm2 and   mean gradient of 40 mmHg. A trivial posterior pericardial   effusion is noted.  Laboratory Data:  Chemistry Recent Labs Lab 08/21/16 0545 08/22/16 0552 08/23/16 0720  NA 140 141 139  K 3.5 4.2 3.9  CL 101 101 98*  CO2 28 31 35*  GLUCOSE 94 95 98  BUN 31* 30* 21*  CREATININE 1.25* 0.93 0.71  CALCIUM 8.4* 8.9 9.1  GFRNONAA 39* 56* >60  GFRAA 45* >60 >60  ANIONGAP 11 9 6      Recent Labs Lab 08/21/16 0545  PROT 5.5*  ALBUMIN 2.9*  AST 27  ALT 19  ALKPHOS 62  BILITOT 0.5   Hematology Recent Labs Lab 08/21/16 0545 08/22/16 0552 08/23/16 0720  WBC 12.2* 12.8* 12.6*  RBC 2.70* 2.92* 3.19*  HGB 8.9* 9.4* 10.1*  HCT 27.5* 30.1* 33.1*  MCV 101.9* 103.1* 103.8*  MCH 33.0 32.2 31.7  MCHC 32.4 31.2 30.5  RDW 14.5 14.1 13.9  PLT 243 300 367   Cardiac Enzymes Recent Labs Lab 08/20/16 0013 08/20/16 1803 08/20/16 2358 08/21/16 0545  TROPONINI 0.11* 0.13* 0.13* 0.14*    Recent Labs Lab 08/20/16 0025  TROPIPOC 0.11*    BNP Recent Labs Lab 08/20/16 0013  BNP 2,074.8*    DDimer No results for input(s): DDIMER in the last 168 hours.  Radiology/Studies:  Dg Chest Port 1 View  Result Date: 08/22/2016 CLINICAL DATA:  83 year old female with respiratory failure post extubation. Subsequent encounter. EXAM: PORTABLE CHEST 1 VIEW COMPARISON:  08/21/2016. FINDINGS: Endotracheal tube and nasogastric tube removed. Mild pulmonary edema superimposed upon chronic changes. Persistent consolidation retrocardiac region may represent atelectasis or infiltrate. Recommend follow-up until clearance. Biapical parenchymal changes greater on left without associated bony destruction. Findings have progressed slightly compared to 2011 exam. Stability can be assessed on follow-up. Calcified aorta. IMPRESSION: Endotracheal tube and nasogastric tube and been removed. Pulmonary edema superimposed upon chronic changes once again noted. Small pleural effusions suspected.  Retrocardiac opacity may represent atelectasis or infiltrate. Biapical parenchymal changes suggestive of scar have progressed slightly since 2011 exam. Stability can be confirmed on follow-up. Electronically Signed   By: Lacy Duverney M.D.   On: 08/22/2016 07:39   Dg Chest [facility] 1 View  Result  Date: 08/21/2016 CLINICAL DATA:  83 year old female with respiratory failure. Subsequent encounter. EXAM: PORTABLE CHEST 1 VIEW COMPARISON:  08/20/2016. FINDINGS: Endotracheal tube tip 4.6 cm above the carina. Nasogastric tube courses below the diaphragm. Tip is not included on the present exam. Mild pulmonary edema superimposed upon chronic lung changes. Medial left base consolidation may represent crowding of markings/ atelectasis and less likely infiltrate. Heart size top-normal. Calcified aorta. Bilateral shoulder joint degenerative changes. Apical pleural thickening greater on left without associated bony destruction. IMPRESSION: Mild pulmonary edema superimposed upon chronic lung changes. Medial left base consolidation may represent crowding of markings/ atelectasis and less likely infiltrate. Electronically Signed   By: Lacy DuverneySteven  Olson M.D.   On: 08/21/2016 07:16   Dg Chest Port 1 View  Result Date: 08/20/2016 CLINICAL DATA:  Et tube present COPD Diabetes HTN PVD EXAM: PORTABLE CHEST 1 VIEW COMPARISON:  Chest x-rays dated 08/20/2016 and 06/19/2016. FINDINGS: Endotracheal tube well positioned with tip approximately 3 cm above the level of the carina. Enteric tube passes below the diaphragm. Mild cardiomegaly is stable. Atherosclerotic changes noted at the aortic arch. Mild perihilar edema, left greater than right, appears improved compared to the earlier exam suggesting improved fluid status. Lungs are hyperexpanded suggesting COPD. No pleural effusion or pneumothorax seen. IMPRESSION: 1. Decreased interstitial edema compared to the chest x-ray from earlier today, suggesting improved fluid status. 2. Hyperexpanded  lungs suggesting COPD. 3. Support apparatus appears appropriately positioned. 4. Aortic atherosclerosis. Electronically Signed   By: Bary RichardStan  Maynard M.D.   On: 08/20/2016 09:50   Dg Chest Port 1 View  Result Date: 08/20/2016 CLINICAL DATA:  Acute onset of respiratory distress. Endotracheal tube and orogastric tube placement. Initial encounter. EXAM: PORTABLE CHEST 1 VIEW COMPARISON:  Chest radiograph performed 06/19/2016 FINDINGS: The patient's endotracheal tube is seen ending 7 cm above the carina. An enteric tube is noted extending over the body of the stomach. Vascular congestion is noted. Bilateral central airspace opacities raise concern for pulmonary edema. No significant pleural effusion or pneumothorax is seen. The cardiomediastinal silhouette is borderline enlarged. No acute osseous abnormalities are identified. IMPRESSION: 1. Endotracheal tube seen ending 7 cm above the carina. This could be advanced 3 cm, as deemed clinically appropriate. 2. Vascular congestion and borderline cardiomegaly. Bilateral central airspace opacities raise concern for pulmonary edema. Electronically Signed   By: Roanna RaiderJeffery  Chang M.D.   On: 08/20/2016 00:41    Assessment and Plan:   1. Severe aortic stenosis: Severe aortic valve stenosis with AVA 0.6cm2 and mean gradient gradient of 40 mmHg. Mild aortic stenosis was seen in 2013 and noted to be moderate at this time.The patient was lost to follow-up. Hospice/ palliative care RN is in room with patient and family members to discuss goals of care. She has multiple comorbidities and is a poor candidate for Valve replacement. Will consider TAVR and discuss this with Dr. Duke Salviaandolph.   2. Diastolic heart failure with elevated Troponins: LV function could not be assessed during echo, it is unclear why. Pt on exam seem to be in sinus tach.She has had flat trend of her troponins ( 0.11  ? 0.13  ? 0.13  ? 0.14). This is likely due to demand ischemia. I am not recommending ischemic  work-up at this time without chest pain or peaking troponins. Net loss- 685.4 Admission weight 110 and today weight is 101.  3. Acute respiratory failure: pt with severe COPD had run out of her home oxygen becoming increasingly short of breath until confused. She  was intubated, diuresed and has since been extubated. She is now awake and on 2L [place] with increased effort of breathing only able to speak 2-3 words at a time before needing to catch her breath. Pulmonary critical are are involved and treatment with prednisone and duonebs.  4. Paroxysmal afib/flutter: resolved. I do not see any EKGs that captured her atrial arrhythmia. CHADVASc is 59 ( age 36, F 1, CHF 1, HTN 1). The patient is very frail and deconditioned, family reports frequent falls. She is not a good candidate for anticoagulation.   5. Protein deficient malnourishment: medicine to manage.  Suan Halter, PA-C  08/23/2016 1:26 PM

## 2016-08-24 ENCOUNTER — Telehealth: Payer: Self-pay

## 2016-08-24 DIAGNOSIS — I5033 Acute on chronic diastolic (congestive) heart failure: Secondary | ICD-10-CM

## 2016-08-24 DIAGNOSIS — Z515 Encounter for palliative care: Secondary | ICD-10-CM

## 2016-08-24 DIAGNOSIS — R531 Weakness: Secondary | ICD-10-CM

## 2016-08-24 DIAGNOSIS — Z7189 Other specified counseling: Secondary | ICD-10-CM

## 2016-08-24 DIAGNOSIS — J441 Chronic obstructive pulmonary disease with (acute) exacerbation: Principal | ICD-10-CM

## 2016-08-24 DIAGNOSIS — I350 Nonrheumatic aortic (valve) stenosis: Secondary | ICD-10-CM

## 2016-08-24 LAB — GLUCOSE, CAPILLARY
GLUCOSE-CAPILLARY: 121 mg/dL — AB (ref 65–99)
GLUCOSE-CAPILLARY: 157 mg/dL — AB (ref 65–99)
GLUCOSE-CAPILLARY: 74 mg/dL (ref 65–99)
Glucose-Capillary: 130 mg/dL — ABNORMAL HIGH (ref 65–99)

## 2016-08-24 LAB — CBC
HEMATOCRIT: 30.5 % — AB (ref 36.0–46.0)
HEMOGLOBIN: 9.4 g/dL — AB (ref 12.0–15.0)
MCH: 31.5 pg (ref 26.0–34.0)
MCHC: 30.8 g/dL (ref 30.0–36.0)
MCV: 102.3 fL — ABNORMAL HIGH (ref 78.0–100.0)
Platelets: 331 10*3/uL (ref 150–400)
RBC: 2.98 MIL/uL — AB (ref 3.87–5.11)
RDW: 13.6 % (ref 11.5–15.5)
WBC: 11.1 10*3/uL — AB (ref 4.0–10.5)

## 2016-08-24 LAB — BASIC METABOLIC PANEL
ANION GAP: 7 (ref 5–15)
BUN: 19 mg/dL (ref 6–20)
CHLORIDE: 99 mmol/L — AB (ref 101–111)
CO2: 34 mmol/L — AB (ref 22–32)
CREATININE: 0.77 mg/dL (ref 0.44–1.00)
Calcium: 9 mg/dL (ref 8.9–10.3)
GFR calc non Af Amer: >60 mL/min (ref >60)
Glucose, Bld: 82 mg/dL (ref 65–99)
POTASSIUM: 3.7 mmol/L (ref 3.5–5.1)
SODIUM: 140 mmol/L (ref 135–145)

## 2016-08-24 LAB — PROCALCITONIN: PROCALCITONIN: 0.1 ng/mL

## 2016-08-24 MED ORDER — PREDNISONE 10 MG PO TABS: ORAL_TABLET | ORAL | Status: AC

## 2016-08-24 MED ORDER — LORAZEPAM 0.5 MG PO TABS: 0.5000 mg | ORAL_TABLET | Freq: Three times a day (TID) | ORAL | 0 refills | Status: AC | PRN

## 2016-08-24 MED ORDER — FUROSEMIDE 20 MG PO TABS: 20.0000 mg | ORAL_TABLET | ORAL | Status: DC

## 2016-08-24 MED ORDER — FUROSEMIDE 20 MG PO TABS: 20.0000 mg | ORAL_TABLET | Freq: Every day | ORAL | Status: DC

## 2016-08-24 MED ORDER — HYDROCODONE-ACETAMINOPHEN 7.5-325 MG PO TABS: 1.0000 | ORAL_TABLET | Freq: Four times a day (QID) | ORAL | 0 refills | Status: DC | PRN

## 2016-08-24 MED ORDER — FUROSEMIDE 20 MG PO TABS
20.0000 mg | ORAL_TABLET | ORAL | Status: DC
  Administered 2016-08-24: 20 mg via ORAL
  Filled 2016-08-24: qty 1

## 2016-08-24 MED ORDER — ALPRAZOLAM 0.25 MG PO TABS
0.2500 mg | ORAL_TABLET | Freq: Once | ORAL | Status: AC
  Administered 2016-08-24: 0.25 mg via ORAL
  Filled 2016-08-24: qty 1

## 2016-08-24 MED ORDER — GLUCERNA SHAKE PO LIQD
237.0000 mL | Freq: Three times a day (TID) | ORAL | Status: DC
  Administered 2016-08-24: 237 mL via ORAL

## 2016-08-24 MED ORDER — GLUCERNA SHAKE PO LIQD
237.0000 mL | Freq: Two times a day (BID) | ORAL | Status: DC
  Administered 2016-08-24: 237 mL via ORAL

## 2016-08-24 MED ORDER — INSULIN ASPART 100 UNIT/ML ~~LOC~~ SOLN: 0.0000 [IU] | SUBCUTANEOUS | 11 refills | Status: AC

## 2016-08-24 NOTE — Progress Notes (Signed)
Progress Note  Patient Name: Denise Adams Date of Encounter: 08/24/2016  Primary Cardiologist: Dr. Jens Somrenshaw  Subjective   Feeling short of breath with ambulation.  Anxious about going home   Inpatient Medications    Scheduled Meds: . aspirin EC  81 mg Oral Daily  . enoxaparin (LOVENOX) injection  40 mg Subcutaneous Q24H  . feeding supplement (ENSURE ENLIVE)  237 mL Oral TID BM  . furosemide  20 mg Oral Daily  . insulin aspart  0-9 Units Subcutaneous Q4H  . ipratropium-albuterol  3 mL Nebulization TID  . mouth rinse  15 mL Mouth Rinse BID  . pantoprazole  40 mg Oral QHS  . predniSONE  40 mg Oral Q breakfast  . rOPINIRole  0.5 mg Oral QHS   Continuous Infusions: . sodium chloride    . piperacillin-tazobactam (ZOSYN)  IV Stopped (08/24/16 0416)  . vancomycin Stopped (08/24/16 0314)   PRN Meds: sodium chloride, acetaminophen (TYLENOL) oral liquid 160 mg/5 mL, albuterol, bisacodyl, sennosides   Vital Signs    Vitals:   08/24/16 0351 08/24/16 0751 08/24/16 0753 08/24/16 0755  BP: 139/71     Pulse: 96  (!) 10   Resp: 18  20   Temp: 98.5 F (36.9 C)     TempSrc: Oral     SpO2: 97% 98%  98%  Weight: 49.2 kg (108 lb 8 oz)     Height:        Intake/Output Summary (Last 24 hours) at 08/24/16 0839 Last data filed at 08/24/16 0600  Gross per 24 hour  Intake              950 ml  Output              250 ml  Net              700 ml   Filed Weights   08/21/16 0500 08/22/16 0338 08/24/16 0351  Weight: 46.6 kg (102 lb 11.8 oz) 46 kg (101 lb 6.6 oz) 49.2 kg (108 lb 8 oz)    Telemetry    Sinus tachycardia, sinus rhythm - Personally Reviewed  ECG    n/a - Personally Reviewed  Physical Exam   GEN: Mild respiratory distress.   Neck: No JVD Cardiac: RRR, III/VI systolic murmur at the LUSB.  No rubs, or gallops.  Respiratory: Diffuse, scattered expiratory wheezes.  No crackles or rhonchi. GI: Soft, nontender, non-distended  MS: No edema; No deformity. Neuro:   Nonfocal  Psych: Anxious  Labs    Chemistry Recent Labs Lab 08/21/16 0545 08/22/16 0552 08/23/16 0720 08/24/16 0441  NA 140 141 139 140  K 3.5 4.2 3.9 3.7  CL 101 101 98* 99*  CO2 28 31 35* 34*  GLUCOSE 94 95 98 82  BUN 31* 30* 21* 19  CREATININE 1.25* 0.93 0.71 0.77  CALCIUM 8.4* 8.9 9.1 9.0  PROT 5.5*  --   --   --   ALBUMIN 2.9*  --   --   --   AST 27  --   --   --   ALT 19  --   --   --   ALKPHOS 62  --   --   --   BILITOT 0.5  --   --   --   GFRNONAA 39* 56* >60 >60  GFRAA 45* >60 >60 >60  ANIONGAP 11 9 6 7      Hematology Recent Labs Lab 08/22/16 [id number] 08/23/16 0720 08/24/16 0441  WBC 12.8* 12.6* 11.1*  RBC 2.92* 3.19* 2.98*  HGB 9.4* 10.1* 9.4*  HCT 30.1* 33.1* 30.5*  MCV 103.1* 103.8* 102.3*  MCH 32.2 31.7 31.5  MCHC 31.2 30.5 30.8  RDW 14.1 13.9 13.6  PLT 300 367 331    Cardiac Enzymes Recent Labs Lab 08/20/16 0013 08/20/16 1803 08/20/16 2358 08/21/16 0545  TROPONINI 0.11* 0.13* 0.13* 0.14*    Recent Labs Lab 08/20/16 0025  TROPIPOC 0.11*     BNP Recent Labs Lab 08/20/16 0013  BNP 2,074.8*     DDimer No results for input(s): DDIMER in the last 168 hours.   Radiology    No results found.  Cardiac Studies   Echo 08/22/16: Study Conclusions  - Left ventricle: The cavity size was normal. Wall thickness was   normal. Systolic function was normal. The estimated ejection   fraction was in the range of 50% to 55%. Basal inferior   hypokinesis. The study is not technically sufficient to allow   evaluation of LV diastolic function. - Aortic valve: Calcified with leaflet restriction. There is severe   stenosis. No significant regurgitation. Mean gradient (S): 39 mm   Hg. Peak gradient (S): 68 mm Hg. Valve area (Vmax): 0.67 cm^2.   Valve area (Vmean): 0.6 cm^2. - Mitral valve: Sclerotic and tethered posterior leaflet. There is   moderate posteriorly-directed mitral regurgitation. Calcified   annulus. - Left atrium: Moderately  dilated. - Tricuspid valve: There was mild regurgitation. - Pulmonary arteries: PA peak pressure: 27 mm Hg (S). - Systemic veins: The IVC measures <2.1 cm, but does not collapse   >50%, suggesting an elevated RA pressure of 8 mmHg. - Pericardium, extracardiac: A trivial pericardial effusion was   identified posterior to the heart. Features were not consistent   with tamponade physiology.   Patient Profile     83 y.o. female with severe aortic stenosis, hypertension, hyperlipidemia, diabetes, PAD (s/p R iliac stent with in-stent stenosis) and L CEA) and COPD on home O2 admitted with COPD exacerbation in the setting of running out of supplemental oxygen.  Assessment & Plan    # Severe aortic stenosis:  Ms. Mance is interested in TAVR if she gets stronger.  She is also awaiting her outpatient pulmonary appointment and PFTs to determine the status of her lungs.  She is currently euvolemic.  Her oral intake is poor.  We will try lasix 20mg  every other day.  BNP on admission was 2000.  # Atrial fibrillation/flutter: There is a report of atrial fibrillation/flutter on admission.  However, there is no documentation of this on EKG or telemetry.  She did have sinus tachycardia with rates in the 120-130s.  We will not start anticoagulation.  # COPD exacerbation: Per primary team.  # Deconditioning: Ms. Mena is very frail and deconditioned.  She has only been out of the bed to go to the bedside commode.  PT evaluation today for needs assessment upon discharge.  She may benefit from rehab.   Signed, Chilton Si, MD  08/24/2016, 8:39 AM

## 2016-08-24 NOTE — Clinical Social Work Placement (Addendum)
   CLINICAL SOCIAL WORK PLACEMENT  NOTE 08/24/16 - DISCHARGED TO [facility] VIA AMBULANCE  Date:  08/24/2016  Patient Details  Name: Denise Adams MRN: [id number] Date of Birth: 03-30-1932  Clinical Social Work is seeking post-discharge placement for this patient at the Skilled  Nursing Facility level of care (*CSW will initial, date and re-position this form in  chart as items are completed):  Yes   Patient/family provided with [place] Clinical Social Work Department's list of facilities offering this level of care within the geographic area requested by the patient (or if unable, by the patient's family).  Yes   Patient/family informed of their freedom to choose among providers that offer the needed level of care, that participate in Medicare, Medicaid or managed care program needed by the patient, have an available bed and are willing to accept the patient.  Yes   Patient/family informed of [facility]'s ownership interest in [facility] and [facility], as well as of the fact that they are under no obligation to receive care at these facilities.  PASRR submitted to EDS on 08/24/16     PASRR number received on 08/24/16     Existing PASRR number confirmed on       FL2 transmitted to all facilities in geographic area requested by pt/family on 08/24/16     FL2 transmitted to all facilities within larger geographic area on       Patient informed that his/her managed care company has contracts with or will negotiate with certain facilities, including the following:         Yes - Patient/family informed of bed offers received.  [facility] bed at  [facility]     Physician recommends and patient chooses bed at      [facility] on  08/24/16.  Patient to be transferred to facility by   ambulance    Patient family notified on  08/24/16 of transfer.  Name of family member notified:   Daughter Michel BickersDeborah Isley at the bedside.      PHYSICIAN       Additional Comment:    _______________________________________________ Cristobal Goldmannrawford, Danya Spearman Bradley, LCSW 08/24/2016, 12:51 PM

## 2016-08-24 NOTE — Consult Note (Addendum)
   [facility] CM Inpatient Consult   08/24/2016  Denise Adams 09-20-1931 [id number]   Follow up:  Met with the patient and her daughters, Rexanne Mano.  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  Patient is concerned that she is stressed with paying the hospital bills and now her daughter has to stay with other family members.  She would like for [facility] Care Management to assist her in her transition back home from her rehab.  She would like resources set up for her return home for food, applying for assistance including a Medicaid application, if possible.  Patient is currently discharging to [facility] near her daughter, who lives in [place].  Consent form signed and folder with [place] Management was given with contact information. [facility] will have community CSW to follow at the skilled facility. Interview was interrupted by inpatient CSW needing to give daughter instructions for the skilled facility. Of note, [facility] Care Management does not interfere with any services arranged or set up by the inpatient care management staff.  For questions, please contact:  Natividad Brood, RN BSN [employer] [facility] Liaison  [phone] business mobile phone Toll free office [phone]   Patient's primary care provider is Dr. Prince Solian, [place]

## 2016-08-24 NOTE — Progress Notes (Signed)
PT Brief Evaluation Note:  Pt seen for therapy evaluation. Pt requires Min A for all mobility and is able to ambulate 20' with dyspnea on 2L O2. O2 sats remain above 95% throughout gait. Pt will benefit from SNF placement at discharge.   Complete Evaluation to follow.   Colin BroachSabra M. Sherrelle Prochazka PT, DPT  [phone]

## 2016-08-24 NOTE — Care Management Important Message (Signed)
Important Message  Patient Details  Name: Denise Adams MRN: [id number] Date of Birth: 1931/12/31   Medicare Important Message Given:  Yes    Dorena BodoIris Mayela Bullard 08/24/2016, 12:17 PM

## 2016-08-24 NOTE — Evaluation (Signed)
Physical Therapy Evaluation Patient Details Name: Denise Adams MRN: [id number] DOB: Jul 13, 1931 Today's Date: 08/24/2016   History of Present Illness  Pt is an 83 yo female admitted on 08/19/16 with worsenign dyspnea and confusion and was diagnosed with ARF and was intubated on 08/19/16. Pt was extubated on 08/22/16 and was transferred to the floor. PMH significant for COPD, DM, aortic stenosis, HLD, HTN, MI, PVD.   Clinical Impression  Pt presents with the above diagnosis and below deficits for therapy evaluation. Prior to admission, pt was living with her daughter in a single level home. Pt has family who is available to assist when she does return home, but due to deconditioning, pt will benefit from SNF at discharge in order to maximize her functional outcomes prior to DC. Pt will benefit from continued acute PT services in order to assist with transition to venue recommended below.     Follow Up Recommendations SNF;Supervision/Assistance - 24 hour    Equipment Recommendations  None recommended by PT    Recommendations for Other Services       Precautions / Restrictions Precautions Precautions: Fall Restrictions Weight Bearing Restrictions: No      Mobility  Bed Mobility Overal bed mobility: Needs Assistance Bed Mobility: Supine to Sit;Sit to Supine     Supine to sit: Min assist Sit to supine: Min assist   General bed mobility comments: Min A to get EOB  Transfers Overall transfer level: Needs assistance Equipment used: Rolling walker (2 wheeled) Transfers: Sit to/from Stand Sit to Stand: Min assist         General transfer comment: Min A for safety from EOB to RW  Ambulation/Gait Ambulation/Gait assistance: Min assist Ambulation Distance (Feet): 10 Feet Assistive device: Rolling walker (2 wheeled) Gait Pattern/deviations: Step-through pattern;Decreased step length - right;Decreased step length - left Gait velocity: decreased Gait velocity interpretation:  Below normal speed for age/gender General Gait Details: slow sequencing, decreased step length bilaterally. Cues for seqeuncing.  Stairs            Wheelchair Mobility    Modified Rankin (Stroke Patients Only)       Balance Overall balance assessment: Needs assistance Sitting-balance support: No upper extremity supported;Feet supported Sitting balance-Leahy Scale: Fair     Standing balance support: Bilateral upper extremity supported;During functional activity Standing balance-Leahy Scale: Poor Standing balance comment: reliant on RW for stability in standing                             Pertinent Vitals/Pain Pain Assessment: No/denies pain    Home Living Family/patient expects to be discharged to:: Skilled nursing facility                      Prior Function Level of Independence: Independent         Comments: heavy smoker     Hand Dominance   Dominant Hand: Right    Extremity/Trunk Assessment   Upper Extremity Assessment Upper Extremity Assessment: Defer to OT evaluation    Lower Extremity Assessment Lower Extremity Assessment: Generalized weakness       Communication   Communication: No difficulties  Cognition Arousal/Alertness: Awake/alert Behavior During Therapy: WFL for tasks assessed/performed Overall Cognitive Status: Within Functional Limits for tasks assessed  General Comments      Exercises     Assessment/Plan    PT Assessment Patient needs continued PT services  PT Problem List Decreased strength;Decreased activity tolerance;Decreased balance;Decreased mobility;Decreased knowledge of use of DME;Decreased safety awareness;Cardiopulmonary status limiting activity       PT Treatment Interventions DME instruction;Gait training;Functional mobility training;Therapeutic activities;Therapeutic exercise;Balance training    PT Goals (Current goals can be found  in the Care Plan section)  Acute Rehab PT Goals Patient Stated Goal: to get back home PT Goal Formulation: With patient Time For Goal Achievement: 08/31/16 Potential to Achieve Goals: Good    Frequency Min 2X/week   Barriers to discharge        Co-evaluation               AM-PAC PT "6 Clicks" Daily Activity  Outcome Measure Difficulty turning over in bed (including adjusting bedclothes, sheets and blankets)?: Total Difficulty moving from lying on back to sitting on the side of the bed? : Total Difficulty sitting down on and standing up from a chair with arms (e.g., wheelchair, bedside commode, etc,.)?: A Lot Help needed moving to and from a bed to chair (including a wheelchair)?: A Lot Help needed walking in hospital room?: A Lot Help needed climbing 3-5 steps with a railing? : A Lot 6 Click Score: 10    End of Session Equipment Utilized During Treatment: Gait belt Activity Tolerance: Patient limited by fatigue Patient left: in bed;with call bell/phone within reach;with bed alarm set;with family/visitor present Nurse Communication: Mobility status PT Visit Diagnosis: Muscle weakness (generalized) (M62.81);Difficulty in walking, not elsewhere classified (R26.2)    Time: 7829-5621 PT Time Calculation (min) (ACUTE ONLY): 24 min   Charges:   PT Evaluation $PT Eval Moderate Complexity: 1 Procedure PT Treatments $Gait Training: 8-22 mins   PT G Codes:        Colin Broach PT, DPT  [phone]  Roxy Manns 08/24/2016, 1:14 PM

## 2016-08-24 NOTE — Telephone Encounter (Signed)
I spoke with the pt's daughter and scheduled TAVR consult on 09/11/2016 at 2:00 with Dr Clifton JamesMcAlhany per hospital consult.

## 2016-08-24 NOTE — Progress Notes (Signed)
Nutrition Follow-up  DOCUMENTATION CODES:   Severe malnutrition in context of chronic illness  INTERVENTION:   -Change supplement to Glucerna per patient request, will send po TID, each supplement provides 220 kcal and 10 grams of protein -Discontinue Ensure Enlive po TID, each supplement provides 350 kcal and 20 grams of protein  -Order small snacks between meals  -Recommend liberalizing to Regular Diet   NUTRITION DIAGNOSIS:   Malnutrition (Severe) related to chronic illness (COPD) as evidenced by severe depletion of body fat, severe depletion of muscle mass.  Continues but being addressed as modifying supplement, snacks between  meals  GOAL:   Patient will meet greater than or equal to 90% of their needs  Progressing  MONITOR:   PO intake, Supplement acceptance, Diet advancement  REASON FOR ASSESSMENT:   Consult, Malnutrition Screening Tool Assessment of nutrition requirement/status  ASSESSMENT:   Pt with hx of COPD, DM, HTN admitted with worsening SOB, pt is on 2L O2 at home but had run out. Intubated after arrival for COPD exacerbation with pulmonary edema but now extubated.   Pt working with PT, on 2L [place], pt appears fatigued/weak Recorded po intake 61% on average but pt reports she is eating minimally. Pt reports no appetite, not really drinking much of Ensure shakes, Requesting changed to Glucerna shakes due to blood sugars. Prednisone likely culprit of elevated CBGs but will change to Glucerna per pt request.   Labs: reviewed; CBGs 74-213 Meds: ss novolog, prednisone, lasix  Diet Order:  DIET SOFT Room service appropriate? Yes; Fluid consistency: Thin  Skin:  Reviewed, no issues  Last BM:  08/23/16  Height:   Ht Readings from Last 1 Encounters:  08/20/16 5\' 2"  (1.575 m)    Weight:   Wt Readings from Last 1 Encounters:  08/24/16 108 lb 8 oz (49.2 kg)    Ideal Body Weight:  50 kg  BMI:  Body mass index is 19.84 kg/m.  Estimated Nutritional  Needs:   Kcal:  1200-1400  Protein:  65-75 grams  Fluid:  > 1.5 L/day  EDUCATION NEEDS:   No education needs identified at this time  Denise StarcherCate Ettore Trebilcock MS, RD, LDN [phone] Pager  [phone] Weekend/On-Call Pager

## 2016-08-24 NOTE — Progress Notes (Signed)
Patient ID: Denise Adams, female   DOB: [birth year], 83 y.o.   MRN: [id number]  This NP visited patient at the bedside as a follow up to  yesterday's GOCs meeting.  Daughter Gavin PoundDeborah at bedside.  We revisited concepts specific to diagnosis, prognosis and high risk for decompensation.   All remain hopeful for improvement.  Hopeful for SNF for rehabilitation   Plans are for f/u as OP with cardiology and pulmonology   Discussed with patient and family the importance of continued conversation with family and their  medical providers regarding overall plan of care and treatment options,  ensuring decisions are within the context of the patients values and GOCs.  Questions and concerns addressed    Recommend Palliative services at facility  Time in 1115          Time out   1150       Time spent on the unit was 35 min    Greater than 50% of the time was spent in counseling and coordination of care  Lorinda CreedMary Shamarcus Hoheisel NP  Palliative Medicine Team Team Phone # [phone] Pager [phone]

## 2016-08-24 NOTE — Progress Notes (Addendum)
[facility]  PROGRESS NOTE  Denise Adams ZOX:[id number] DOB: 1931/06/30 DOA: 08/19/2016 PCP: Chilton Greathouse, MD   Brief HPI:    83 year old female with a history of COPD, diabetes mellitus, hypertension who had worsening shortness of breath and confusion prior to arrival in the ED. Patient is on 2 L oxygen at home but had apparently run out. She developed acute respiratory distress after arrival in the ED and was intubated. Patient was under the care of PC CM. Patient was extubated and transferred to medical floor.   Tried assumed care on 08/23/2016.   Subjective   This morning patient complains of shortness of breath,  she is requiring 2 L of oxygen via nasal cannula and O2 sats of 98%. Discussed options for disposition: Home with physical therapy versus rehabilitation at skilled facility   Assessment/Plan:     1. Acute hypoxic respiratory failure- improving, currently patient at baseline. Likely from COPD exacerbation in setting of pulmonary edema. Patient was started on steroids and currently on prednisone taper. Continue DuoNeb nebs and albuterol when necessary. Patient was started on Lasix 20 mg by mouth daily, which was changed to every other day by cardiology. She will need follow-up with pulmonary as outpatient. 2. Severe COPD-patient is currently on prednisone taper, will need outpatient PFTs before transcatheter  aortic valve replacement.  3. Acute on chronic diastolic CHF-patient was seen by cardiology and started on Lasix 20 g by mouth every other day  4. Severe aortic stenosis-echocardiogram showed severe aortic stenosis, patient might need TAVR. Cardiology has seen the patient and recommended to follow-up as outpatient to consider transcatheter aortic valve replacement  5. Atrial flutter with RVR- resolved, currently patient is in  normal sinus rhythm. Patient is not a candidate for anti-correlation as per cardiology  6. Coagulase negative staph bacteremia- blood  cultures done on 09/06/2016 showed coagulase negative staph in 1 out of 2 bottles, repeat blood cultures are negative to date. Will discontinue vancomycin and Zosyn at this time 7. Diabetes mellitus - patient started on sliding scale insulin with NovoLog as currently she is on prednisone taper. Blood glucose is well controlled last 3 CBG 74, 121, 130.    DVT prophylaxis: Lovenox  Code Status: Full code  Family Communication: Discussed with patient's daughter at bedside, regarding dispositional options including skilled rehabilitation versus home with physical therapy versus hospice patient not a candidate for surgery. Patient's family was upset, about discussing hospice options as it made patient anxious. Patient's family wanted to change the physician, discussed with Director on call Dr Darnelle Catalan, who recommended that she could come with me to discuss with family, but he no longer change physicians as part of [facility] policy. I conveyed this message to patient and her daughter, and discussed in detail the concerns regarding hospice options. Patient and her daughter are leaning towards going to skilled rehabilitation. We will await PT evaluation for further recommendations.  Disposition Plan: SNF   Consultants: PCCM  Procedures:  Endotracheal intubation  Continuous infusions . sodium chloride        Antibiotics:   Anti-infectives    Start     Dose/Rate Route Frequency Ordered Stop   08/21/16 0200  vancomycin (VANCOCIN) 500 mg in sodium chloride 0.9 % 100 mL IVPB  Status:  Discontinued     500 mg 100 mL/hr over 60 Minutes Intravenous Every 24 hours 08/20/16 0238 08/24/16 1025   08/20/16 1000  piperacillin-tazobactam (ZOSYN) IVPB 3.375 g  Status:  Discontinued     3.375  g 12.5 mL/hr over 240 Minutes Intravenous Every 8 hours 08/20/16 0238 08/24/16 1027   08/20/16 0130  piperacillin-tazobactam (ZOSYN) IVPB 3.375 g     3.375 g 100 mL/hr over 30 Minutes Intravenous  Once 08/20/16 0121  08/20/16 0248   08/20/16 0130  vancomycin (VANCOCIN) IVPB 1000 mg/200 mL premix     1,000 mg 200 mL/hr over 60 Minutes Intravenous  Once 08/20/16 0121 08/20/16 0248       Objective   Vitals:   08/24/16 0751 08/24/16 0753 08/24/16 0755 08/24/16 1000  BP:    (!) 149/78  Pulse:  (!) 10  98  Resp:  20  18  Temp:    98.2 F (36.8 C)  TempSrc:    Oral  SpO2: 98%  98% 96%  Weight:      Height:        Intake/Output Summary (Last 24 hours) at 08/24/16 1252 Last data filed at 08/24/16 0600  Gross per 24 hour  Intake              710 ml  Output                0 ml  Net              710 ml   Filed Weights   08/21/16 0500 08/22/16 0338 08/24/16 0351  Weight: 46.6 kg (102 lb 11.8 oz) 46 kg (101 lb 6.6 oz) 49.2 kg (108 lb 8 oz)     Physical Examination:   Physical Exam: Eyes: No icterus, extraocular muscles intact  Mouth: Oral mucosa is moist, no lesions on palate,  Neck: Supple, no deformities, masses, or tenderness Lungs: Mild tachypnea, bilateral wheezing Heart: Regular rate and rhythm, S1 and S2 normal, no murmurs, rubs auscultated Abdomen: BS normoactive,soft,nondistended,non-tender to palpation,no organomegaly Extremities: No pretibial edema, no erythema, no cyanosis, no clubbing Neuro : Alert and oriented to time, place and person, No focal deficits  Skin: No rashes seen on exam     Data Reviewed: I have personally reviewed following labs and imaging studies  CBG:  Recent Labs Lab 08/23/16 2014 08/24/16 0004 08/24/16 0356 08/24/16 0738 08/24/16 1127  GLUCAP 161* 157* 74 121* 130*    CBC:  Recent Labs Lab 08/20/16 0013 08/20/16 0306 08/21/16 0545 08/22/16 0552 08/23/16 0720 08/24/16 0441  WBC 20.6* 14.6* 12.2* 12.8* 12.6* 11.1*  NEUTROABS 11.8*  --   --   --   --   --   HGB 11.1* 9.9* 8.9* 9.4* 10.1* 9.4*  HCT 35.8* 31.7* 27.5* 30.1* 33.1* 30.5*  MCV 105.9* 106.4* 101.9* 103.1* 103.8* 102.3*  PLT 319 251 243 300 367 331    Basic Metabolic  Panel:  Recent Labs Lab 08/20/16 0306 08/21/16 0545 08/22/16 0552 08/23/16 0720 08/24/16 0441  NA 137 140 141 139 140  K 4.1 3.5 4.2 3.9 3.7  CL 105 101 101 98* 99*  CO2 24 28 31  35* 34*  GLUCOSE 197* 94 95 98 82  BUN 20 31* 30* 21* 19  CREATININE 0.95 1.25* 0.93 0.71 0.77  CALCIUM 8.1* 8.4* 8.9 9.1 9.0  MG 1.5* 2.5*  --  1.8  --   PHOS 5.2* 4.6  --  2.6  --     Recent Results (from the past 240 hour(s))  Blood culture (routine x 2)     Status: Abnormal   Collection Time: 08/20/16 12:01 AM  Result Value Ref Range Status   Specimen Description BLOOD LEFT ARM  Final  Special Requests   Final    BOTTLES DRAWN AEROBIC ONLY Blood Culture adequate volume   Culture  Setup Time   Final    GRAM POSITIVE COCCI IN CLUSTERS AEROBIC BOTTLE ONLY Organism ID to follow CRITICAL RESULT CALLED TO, READ BACK BY AND VERIFIED WITH: A JOHNSTON 08/21/16 @ 0811 M VESTAL    Culture (A)  Final    STAPHYLOCOCCUS SPECIES (COAGULASE NEGATIVE) THE SIGNIFICANCE OF ISOLATING THIS ORGANISM FROM A SINGLE SET OF BLOOD CULTURES WHEN MULTIPLE SETS ARE DRAWN IS UNCERTAIN. PLEASE NOTIFY THE MICROBIOLOGY DEPARTMENT WITHIN ONE WEEK IF SPECIATION AND SENSITIVITIES ARE REQUIRED.    Report Status 08/22/2016 FINAL  Final  Blood Culture ID Panel (Reflexed)     Status: Abnormal   Collection Time: 08/20/16 12:01 AM  Result Value Ref Range Status   Enterococcus species NOT DETECTED NOT DETECTED Final   Listeria monocytogenes NOT DETECTED NOT DETECTED Final   Staphylococcus species DETECTED (A) NOT DETECTED Final    Comment: Methicillin (oxacillin) resistant coagulase negative staphylococcus. Possible blood culture contaminant (unless isolated from more than one blood culture draw or clinical case suggests pathogenicity). No antibiotic treatment is indicated for blood  culture contaminants. CRITICAL RESULT CALLED TO, READ BACK BY AND VERIFIED WITH: A JOHNSTON 08/21/16 @ 0811 M VESTAL    Staphylococcus aureus NOT  DETECTED NOT DETECTED Final   Methicillin resistance DETECTED (A) NOT DETECTED Final    Comment: CRITICAL RESULT CALLED TO, READ BACK BY AND VERIFIED WITH: A JOHNSTON 08/21/16 @ 0811 M VESTAL    Streptococcus species NOT DETECTED NOT DETECTED Final   Streptococcus agalactiae NOT DETECTED NOT DETECTED Final   Streptococcus pneumoniae NOT DETECTED NOT DETECTED Final   Streptococcus pyogenes NOT DETECTED NOT DETECTED Final   Acinetobacter baumannii NOT DETECTED NOT DETECTED Final   Enterobacteriaceae species NOT DETECTED NOT DETECTED Final   Enterobacter cloacae complex NOT DETECTED NOT DETECTED Final   Escherichia coli NOT DETECTED NOT DETECTED Final   Klebsiella oxytoca NOT DETECTED NOT DETECTED Final   Klebsiella pneumoniae NOT DETECTED NOT DETECTED Final   Proteus species NOT DETECTED NOT DETECTED Final   Serratia marcescens NOT DETECTED NOT DETECTED Final   Haemophilus influenzae NOT DETECTED NOT DETECTED Final   Neisseria meningitidis NOT DETECTED NOT DETECTED Final   Pseudomonas aeruginosa NOT DETECTED NOT DETECTED Final   Candida albicans NOT DETECTED NOT DETECTED Final   Candida glabrata NOT DETECTED NOT DETECTED Final   Candida krusei NOT DETECTED NOT DETECTED Final   Candida parapsilosis NOT DETECTED NOT DETECTED Final   Candida tropicalis NOT DETECTED NOT DETECTED Final  Blood culture (routine x 2)     Status: None (Preliminary result)   Collection Time: 08/20/16 12:30 AM  Result Value Ref Range Status   Specimen Description BLOOD LEFT HAND  Final   Special Requests IN PEDIATRIC BOTTLE Blood Culture adequate volume  Final   Culture NO GROWTH 4 DAYS  Final   Report Status PENDING  Incomplete  Urine culture     Status: None   Collection Time: 08/20/16 12:56 AM  Result Value Ref Range Status   Specimen Description URINE, CATHETERIZED  Final   Special Requests NONE  Final   Culture NO GROWTH  Final   Report Status 08/21/2016 FINAL  Final  MRSA PCR Screening     Status: None    Collection Time: 08/20/16  3:30 AM  Result Value Ref Range Status   MRSA by PCR NEGATIVE NEGATIVE Final    Comment:  The GeneXpert MRSA Assay (FDA approved for NASAL specimens only), is one component of a comprehensive MRSA colonization surveillance program. It is not intended to diagnose MRSA infection nor to guide or monitor treatment for MRSA infections.   Respiratory Panel by PCR     Status: None   Collection Time: 08/20/16  6:47 PM  Result Value Ref Range Status   Adenovirus NOT DETECTED NOT DETECTED Final   Coronavirus 229E NOT DETECTED NOT DETECTED Final   Coronavirus HKU1 NOT DETECTED NOT DETECTED Final   Coronavirus NL63 NOT DETECTED NOT DETECTED Final   Coronavirus OC43 NOT DETECTED NOT DETECTED Final   Metapneumovirus NOT DETECTED NOT DETECTED Final   Rhinovirus / Enterovirus NOT DETECTED NOT DETECTED Final   Influenza A NOT DETECTED NOT DETECTED Final   Influenza B NOT DETECTED NOT DETECTED Final   Parainfluenza Virus 1 NOT DETECTED NOT DETECTED Final   Parainfluenza Virus 2 NOT DETECTED NOT DETECTED Final   Parainfluenza Virus 3 NOT DETECTED NOT DETECTED Final   Parainfluenza Virus 4 NOT DETECTED NOT DETECTED Final   Respiratory Syncytial Virus NOT DETECTED NOT DETECTED Final   Bordetella pertussis NOT DETECTED NOT DETECTED Final   Chlamydophila pneumoniae NOT DETECTED NOT DETECTED Final   Mycoplasma pneumoniae NOT DETECTED NOT DETECTED Final  Culture, respiratory (NON-Expectorated)     Status: None   Collection Time: 08/21/16  1:34 AM  Result Value Ref Range Status   Specimen Description TRACHEAL ASPIRATE  Final   Special Requests Normal  Final   Gram Stain   Final    FEW WBC PRESENT,BOTH PMN AND MONONUCLEAR RARE GRAM POSITIVE COCCI IN PAIRS IN CLUSTERS    Culture FEW CANDIDA ALBICANS  Final   Report Status 08/23/2016 FINAL  Final  Culture, blood (Routine X 2) w Reflex to ID Panel     Status: None (Preliminary result)   Collection Time: 08/21/16  12:30 PM  Result Value Ref Range Status   Specimen Description BLOOD RIGHT HAND  Final   Special Requests IN PEDIATRIC BOTTLE Blood Culture adequate volume  Final   Culture NO GROWTH 3 DAYS  Final   Report Status PENDING  Incomplete  Culture, blood (Routine X 2) w Reflex to ID Panel     Status: None (Preliminary result)   Collection Time: 08/21/16 12:40 PM  Result Value Ref Range Status   Specimen Description BLOOD RIGHT HAND  Final   Special Requests IN PEDIATRIC BOTTLE Blood Culture adequate volume  Final   Culture NO GROWTH 3 DAYS  Final   Report Status PENDING  Incomplete     Liver Function Tests:  Recent Labs Lab 08/21/16 0545  AST 27  ALT 19  ALKPHOS 62  BILITOT 0.5  PROT 5.5*  ALBUMIN 2.9*   No results for input(s): LIPASE, AMYLASE in the last 168 hours. No results for input(s): AMMONIA in the last 168 hours.  Cardiac Enzymes:  Recent Labs Lab 08/20/16 0013 08/20/16 1803 08/20/16 2358 08/21/16 0545  TROPONINI 0.11* 0.13* 0.13* 0.14*   BNP (last 3 results)  Recent Labs  05/11/16 1621 06/19/16 1920 08/20/16 0013  BNP 439.1* 285.3* 2,074.8*    ProBNP (last 3 results) No results for input(s): PROBNP in the last 8760 hours.    Studies: No results found.  Scheduled Meds: . aspirin EC  81 mg Oral Daily  . enoxaparin (LOVENOX) injection  40 mg Subcutaneous Q24H  . feeding supplement (GLUCERNA SHAKE)  237 mL Oral TID BM  . furosemide  20 mg  Oral QODAY  . insulin aspart  0-9 Units Subcutaneous Q4H  . ipratropium-albuterol  3 mL Nebulization TID  . mouth rinse  15 mL Mouth Rinse BID  . pantoprazole  40 mg Oral QHS  . predniSONE  40 mg Oral Q breakfast  . rOPINIRole  0.5 mg Oral QHS      Time spent: 35 min  [facility]   [facility] Pager [phone]. If 7PM-7AM, please contact night-coverage at [email or web address], Office  [phone]  password [email or web address] 08/24/2016, 12:52 PM  LOS: 4 days

## 2016-08-24 NOTE — [place] FL2 (Signed)
[place] MEDICAID FL2 LEVEL OF CARE SCREENING TOOL     IDENTIFICATION  Patient Name: Denise Adams Birthdate: 22-Nov-1931 Sex: female Admission Date (Current Location): 08/19/2016  [facility] [place]   Facility and Address:  [facility] [place], [address], [place], [place] [address]      Provider Number: [id number]  Attending Physician Name and Address:  Meredeth IdeLama, Gagan S, MD  Relative Name and Phone Number:  Michel BickersDeborah Isley - daughter - [phone] (mobile)    Current Level of Care: Hospital Recommended Level of Care: Skilled Nursing Facility Prior Approval Number:    Date Approved/Denied:   PASRR Number: [id number] (Eff. 08/24/16)  Discharge Plan: SNF    Current Diagnoses: Patient Active Problem List   Diagnosis Date Noted  . Acute respiratory failure (HCC) 08/20/2016  . Respiratory failure (HCC) 08/20/2016  . Anxiety state 06/20/2016  . COPD exacerbation (HCC) 06/20/2016  . Sepsis (HCC) 06/20/2016  . Protein-calorie malnutrition, severe 06/20/2016  . HCAP (healthcare-associated pneumonia) 06/19/2016  . Viral syndrome 05/11/2016  . Hypothyroidism, acquired 05/11/2016  . Hyponatremia 05/11/2016  . Elevated LFTs 05/11/2016  . Carotid artery stenosis 09/21/2015  . Occlusion and stenosis of carotid artery without mention of cerebral infarction 09/10/2012  . Abdominal aortic aneurysm (HCC) 08/23/2010  . Nonspecific abnormal unspecified cardiovascular function study 08/23/2010  . Severe aortic stenosis 06/30/2010  . Shortness of breath dyspnea 06/30/2010  . Abdominal bruit 06/30/2010  . Tobacco abuse 06/30/2010  . Essential hypertension   . HLD (hyperlipidemia)   . Diabetes mellitus   . PVD (peripheral vascular disease) (HCC)     Orientation RESPIRATION BLADDER Height & Weight     Self, Time, Situation, Place  O2 (2 Liters O2) Continent Weight: 108 lb 8 oz (49.2 kg) Height:  5\' 2"  (157.5 cm)  BEHAVIORAL SYMPTOMS/MOOD  NEUROLOGICAL BOWEL NUTRITION STATUS      Continent Diet (Soft diet)  AMBULATORY STATUS COMMUNICATION OF NEEDS Skin   Limited Assist Verbally Other (Comment) (Ecchymosis right/left arm)                       Personal Care Assistance Level of Assistance  Bathing, Feeding, Dressing Bathing Assistance: Limited assistance Feeding assistance: Independent Dressing Assistance: Limited assistance     Functional Limitations Info  Sight, Hearing, Speech Sight Info: Impaired Hearing Info: Adequate Speech Info: Adequate    SPECIAL CARE FACTORS FREQUENCY  PT (By licensed PT)     PT Frequency: Evaluated 5/17              Contractures Contractures Info: Not present    Additional Factors Info  Code Status, Allergies, Insulin Sliding Scale Code Status Info: Full Allergies Info: No known allergies   Insulin Sliding Scale Info: 0-9 Units every 4 hours       Current Medications (08/24/2016):  This is the current hospital active medication list Current Facility-Administered Medications  Medication Dose Route Frequency Provider Last Rate Last Dose  . 0.9 %  sodium chloride infusion  250 mL Intravenous PRN Carolan ClinesGiddings, Olivia K, MD      . acetaminophen (TYLENOL) solution 650 mg  650 mg Per Tube Q6H PRN de Dios, Hilton CorkJose Angelo A, MD   650 mg at 08/23/16 2148  . albuterol (PROVENTIL) (2.5 MG/3ML) 0.083% nebulizer solution 2.5 mg  2.5 mg Nebulization Q2H PRN Carolan ClinesGiddings, Olivia K, MD   2.5 mg at 08/20/16 0916  . aspirin EC tablet 81 mg  81 mg Oral  Daily Mannam, Praveen, MD   81 mg at 08/24/16 0955  . bisacodyl (DULCOLAX) suppository 10 mg  10 mg Rectal Daily PRN Carolan Clines, MD      . enoxaparin (LOVENOX) injection 40 mg  40 mg Subcutaneous Q24H Bajbus, Lauren D, RPH   40 mg at 08/24/16 0556  . feeding supplement (GLUCERNA SHAKE) (GLUCERNA SHAKE) liquid 237 mL  237 mL Oral TID BM Sharl Ma, Sarina Ill, MD      . furosemide (LASIX) tablet 20 mg  20 mg Oral Jolaine Artist, MD   20 mg at  08/24/16 0955  . insulin aspart (novoLOG) injection 0-9 Units  0-9 Units Subcutaneous Q4H Mannam, Praveen, MD   1 Units at 08/24/16 0844  . ipratropium-albuterol (DUONEB) 0.5-2.5 (3) MG/3ML nebulizer solution 3 mL  3 mL Nebulization TID Nelda Bucks, MD   3 mL at 08/24/16 0750  . MEDLINE mouth rinse  15 mL Mouth Rinse BID Nelda Bucks, MD   15 mL at 08/22/16 1610  . pantoprazole (PROTONIX) EC tablet 40 mg  40 mg Oral QHS Meredeth Ide, MD   40 mg at 08/23/16 2138  . predniSONE (DELTASONE) tablet 40 mg  40 mg Oral Q breakfast Mannam, Praveen, MD   40 mg at 08/24/16 0844  . rOPINIRole (REQUIP) tablet 0.5 mg  0.5 mg Oral QHS Leslye Peer, MD   0.5 mg at 08/23/16 2138  . sennosides (SENOKOT) 8.8 MG/5ML syrup 5 mL  5 mL Per Tube BID PRN Carolan Clines, MD         Discharge Medications: Please see discharge summary for a list of discharge medications.  Relevant Imaging Results:  Relevant Lab Results:   Additional Information ss#[id number]  Cristobal Goldmann, LCSW

## 2016-08-24 NOTE — Discharge Summary (Signed)
Physician Discharge Summary  ZOI DEVINE ZOX:[id number] DOB: 1932/01/06 DOA: 08/19/2016  PCP: Chilton Greathouse, MD  Admit date: 08/19/2016 Discharge date: 08/24/2016  Time spent: 45 minutes  Recommendations for Outpatient Follow-up:  1. Follow up cardiology on 08/31/16 at 11: 40 am 2. Follow up pulmonary disease on 08/30/16 at 2 pm 3. Palliative care follow up at SNF    Discharge Diagnoses:  Active Problems:   Acute respiratory failure (HCC)   Respiratory failure (HCC)   Discharge Condition: Stable  Diet recommendation: Low salt diet  Filed Weights   08/21/16 0500 08/22/16 0338 08/24/16 0351  Weight: 46.6 kg (102 lb 11.8 oz) 46 kg (101 lb 6.6 oz) 49.2 kg (108 lb 8 oz)    History of present illness:  83 year old female with a history of COPD, diabetes mellitus, hypertension who had worsening shortness of breath and confusion prior to arrival in the ED. Patient is on 2 L oxygen at home but had apparently run out. She developed acute respiratory distress after arrival in the ED and was intubated. Patient was under the care of PC CM. Patient was extubated and transferred to medical floor.   Tried assumed care on 08/23/2016.  Hospital Course:  1. Acute hypoxic respiratory failure- improving, currently patient at baseline. Likely from COPD exacerbation in setting of pulmonary edema. Patient was started on steroids and currently on prednisone taper. Continue DuoNeb nebs and albuterol when necessary. Patient was started on Lasix 20 mg by mouth daily, which was changed to every other day by cardiology. She will need follow-up with pulmonary as outpatient. 2. Severe COPD-patient is currently on prednisone taper, will need outpatient PFTs before transcatheter  aortic valve replacement.  3. Acute on chronic diastolic CHF-patient was seen by cardiology and started on Lasix 20 g by mouth every other day  4. Severe aortic stenosis-echocardiogram showed severe aortic stenosis, patient  might need TAVR. Cardiology has seen the patient and recommended to follow-up as outpatient to consider transcatheter aortic valve replacement  5. Atrial flutter with RVR- resolved, currently patient is in  normal sinus rhythm. Patient is not a candidate for anti-coagulation  as per cardiology  6. Coagulase negative staph bacteremia- blood cultures done on 09/06/2016 showed coagulase negative staph in 1 out of 2 bottles, repeat blood cultures are negative to date. Will discontinue vancomycin and Zosyn at this time 7. Diabetes mellitus - patient started on sliding scale insulin with NovoLog as currently she is on prednisone taper. Blood glucose is well controlled last 3 CBG 74, 121, 130.  Family Communication: Discussed with patient's daughter at bedside, regarding dispositional options including skilled rehabilitation versus home with physical therapy versus hospice patient not a candidate for surgery. Patient's family was upset, about discussing hospice options as it made patient anxious. Patient's family wanted to change the physician, discussed with Director on call Dr Darnelle Catalan, who recommended that she could come with me to discuss with family, but he no longer change physicians as part of [facility] policy. I conveyed this message to patient and her daughter, and discussed in detail the concerns regarding hospice options. Patient and her daughter are willing to go to rehab.  Patient was seen by palliative care, and follow up recommended at SNF  Procedures:  None   Consultations:  PCCM  Discharge Exam: Vitals:   08/24/16 0753 08/24/16 1000  BP:  (!) 149/78  Pulse: (!) 10 98  Resp: 20 18  Temp:  98.2 F (36.8 C)      Discharge Instructions  Discharge Instructions    Diet - low sodium heart healthy    Complete by:  As directed    Increase activity slowly    Complete by:  As directed      Current Discharge Medication List    START taking these medications   Details  furosemide  (LASIX) 20 MG tablet Take 1 tablet (20 mg total) by mouth every other day. Qty: 30 tablet    insulin aspart (NOVOLOG) 100 UNIT/ML injection Inject 0-9 Units into the skin every 4 (four) hours. Sliding scale insulin Less than 70 initiate hypoglycemia protocol 70-120  0 units 120-150 1 unit 151-200 2 units 201-250 3 units 251-300 5 units 301-350 7 units 351-400 9 units  Greater than 400 call MD Qty: 10 mL, Refills: 11    predniSONE (DELTASONE) 10 MG tablet Prednisone 40 mg po daily x 3 day then Prednisone 30 mg po daily x 3 day then Prednisone 20 mg po daily x 3day then Prednisone 10 mg daily x 3 day then stop...      CONTINUE these medications which have CHANGED   Details  HYDROcodone-acetaminophen (NORCO) 7.5-325 MG tablet Take 1 tablet by mouth every 6 (six) hours as needed for moderate pain. Qty: 10 tablet, Refills: 0    LORazepam (ATIVAN) 0.5 MG tablet Take 1 tablet (0.5 mg total) by mouth every 8 (eight) hours as needed for anxiety. Qty: 10 tablet, Refills: 0      CONTINUE these medications which have NOT CHANGED   Details  albuterol (PROVENTIL HFA;VENTOLIN HFA) 108 (90 BASE) MCG/ACT inhaler Inhale 2 puffs into the lungs every 6 (six) hours as needed for shortness of breath.     aspirin EC 81 MG tablet Take 81 mg by mouth daily.    budesonide-formoterol (SYMBICORT) 160-4.5 MCG/ACT inhaler Inhale 2 puffs into the lungs 2 (two) times daily. Qty: 1 Inhaler, Refills: 5    Cholecalciferol (VITAMIN D) 2000 UNITS CAPS Take 2,000 Units by mouth daily.     cyanocobalamin 1000 MCG tablet Take 1,000 mcg by mouth daily. Reported on 09/21/2015    escitalopram (LEXAPRO) 5 MG tablet Take 5 mg by mouth at bedtime.     feeding supplement, ENSURE ENLIVE, (ENSURE ENLIVE) LIQD Take 237 mLs by mouth 2 (two) times daily between meals. Qty: 237 mL, Refills: 0    ipratropium-albuterol (DUONEB) 0.5-2.5 (3) MG/3ML SOLN Take 3 mLs by nebulization every 4 (four) hours as needed. Qty: 360 mL,  Refills: 0    Multiple Vitamins-Minerals (PRESERVISION AREDS PO) Take 1 tablet by mouth 2 (two) times daily.     ondansetron (ZOFRAN) 4 MG tablet Take 4 mg by mouth every 6 (six) hours as needed. for nausea Refills: 1    pantoprazole (PROTONIX) 40 MG tablet Take 40 mg by mouth every evening. Refills: 5    rOPINIRole (REQUIP) 0.5 MG tablet Take 1 tablet (0.5 mg total) by mouth at bedtime. Qty: 30 tablet, Refills: 0    guaiFENesin (MUCINEX) 600 MG 12 hr tablet Take 1 tablet (600 mg total) by mouth 2 (two) times daily as needed. Qty: 45 tablet, Refills: 0    Krill Oil 1000 MG CAPS Take 1,000 mg by mouth daily.    methocarbamol (ROBAXIN) 500 MG tablet Take 1 tablet (500 mg total) by mouth every 8 (eight) hours as needed for muscle spasms. Qty: 30 tablet, Refills: 0      STOP taking these medications     acetaminophen (TYLENOL) 325 MG tablet  arformoterol (BROVANA) 15 MCG/2ML NEBU      budesonide (PULMICORT) 0.5 MG/2ML nebulizer solution      hydrochlorothiazide (HYDRODIURIL) 25 MG tablet      Ibuprofen-Diphenhydramine HCl (IBUPROFEN PM) 200-25 MG CAPS        No Known Allergies Follow-up Information    Bevelyn Ngo, NP Follow up on 08/30/2016.   Specialty:  Pulmonary Disease Why:   Follow up appointment with Pulmonary at 2 pm. Please arrive 15 minutes early as you are a new patient. Thank you. Contact information: [address] [place] [address] [phone]        Cardiology Follow up.   Contact information: The office will be calling you to schedule an appointment to discuss the heart valve procedure.       [place], Peter M, MD. Nyra Capes on 08/31/2016.   Specialty:  Cardiology Why:  Your appointment time is at 11:40, please arrive 10-15 minutes early. You will see Dr. [place] for this first visit because Dr. Jens Som did not have any openening. Contact information: [address] [place] [address] [phone]            The  results of significant diagnostics from this hospitalization (including imaging, microbiology, ancillary and laboratory) are listed below for reference.    Significant Diagnostic Studies: Dg Chest Port 1 View  Result Date: 08/22/2016 CLINICAL DATA:  83 year old female with respiratory failure post extubation. Subsequent encounter. EXAM: PORTABLE CHEST 1 VIEW COMPARISON:  08/21/2016. FINDINGS: Endotracheal tube and nasogastric tube removed. Mild pulmonary edema superimposed upon chronic changes. Persistent consolidation retrocardiac region may represent atelectasis or infiltrate. Recommend follow-up until clearance. Biapical parenchymal changes greater on left without associated bony destruction. Findings have progressed slightly compared to 2011 exam. Stability can be assessed on follow-up. Calcified aorta. IMPRESSION: Endotracheal tube and nasogastric tube and been removed. Pulmonary edema superimposed upon chronic changes once again noted. Small pleural effusions suspected. Retrocardiac opacity may represent atelectasis or infiltrate. Biapical parenchymal changes suggestive of scar have progressed slightly since 2011 exam. Stability can be confirmed on follow-up. Electronically Signed   By: Lacy Duverney M.D.   On: 08/22/2016 07:39   Dg Chest Port 1 View  Result Date: 08/21/2016 CLINICAL DATA:  83 year old female with respiratory failure. Subsequent encounter. EXAM: PORTABLE CHEST 1 VIEW COMPARISON:  08/20/2016. FINDINGS: Endotracheal tube tip 4.6 cm above the carina. Nasogastric tube courses below the diaphragm. Tip is not included on the present exam. Mild pulmonary edema superimposed upon chronic lung changes. Medial left base consolidation may represent crowding of markings/ atelectasis and less likely infiltrate. Heart size top-normal. Calcified aorta. Bilateral shoulder joint degenerative changes. Apical pleural thickening greater on left without associated bony destruction. IMPRESSION: Mild  pulmonary edema superimposed upon chronic lung changes. Medial left base consolidation may represent crowding of markings/ atelectasis and less likely infiltrate. Electronically Signed   By: Lacy Duverney M.D.   On: 08/21/2016 07:16   Dg Chest Port 1 View  Result Date: 08/20/2016 CLINICAL DATA:  Et tube present COPD Diabetes HTN PVD EXAM: PORTABLE CHEST 1 VIEW COMPARISON:  Chest x-rays dated 08/20/2016 and 06/19/2016. FINDINGS: Endotracheal tube well positioned with tip approximately 3 cm above the level of the carina. Enteric tube passes below the diaphragm. Mild cardiomegaly is stable. Atherosclerotic changes noted at the aortic arch. Mild perihilar edema, left greater than right, appears improved compared to the earlier exam suggesting improved fluid status. Lungs are hyperexpanded suggesting COPD. No pleural effusion or pneumothorax seen.  IMPRESSION: 1. Decreased interstitial edema compared to the chest x-ray from earlier today, suggesting improved fluid status. 2. Hyperexpanded lungs suggesting COPD. 3. Support apparatus appears appropriately positioned. 4. Aortic atherosclerosis. Electronically Signed   By: Bary RichardStan  Maynard M.D.   On: 08/20/2016 09:50   Dg Chest Port 1 View  Result Date: 08/20/2016 CLINICAL DATA:  Acute onset of respiratory distress. Endotracheal tube and orogastric tube placement. Initial encounter. EXAM: PORTABLE CHEST 1 VIEW COMPARISON:  Chest radiograph performed 06/19/2016 FINDINGS: The patient's endotracheal tube is seen ending 7 cm above the carina. An enteric tube is noted extending over the body of the stomach. Vascular congestion is noted. Bilateral central airspace opacities raise concern for pulmonary edema. No significant pleural effusion or pneumothorax is seen. The cardiomediastinal silhouette is borderline enlarged. No acute osseous abnormalities are identified. IMPRESSION: 1. Endotracheal tube seen ending 7 cm above the carina. This could be advanced 3 cm, as deemed  clinically appropriate. 2. Vascular congestion and borderline cardiomegaly. Bilateral central airspace opacities raise concern for pulmonary edema. Electronically Signed   By: Roanna RaiderJeffery  Chang M.D.   On: 08/20/2016 00:41    Microbiology: Recent Results (from the past 240 hour(s))  Blood culture (routine x 2)     Status: Abnormal   Collection Time: 08/20/16 12:01 AM  Result Value Ref Range Status   Specimen Description BLOOD LEFT ARM  Final   Special Requests   Final    BOTTLES DRAWN AEROBIC ONLY Blood Culture adequate volume   Culture  Setup Time   Final    GRAM POSITIVE COCCI IN CLUSTERS AEROBIC BOTTLE ONLY Organism ID to follow CRITICAL RESULT CALLED TO, READ BACK BY AND VERIFIED WITH: A JOHNSTON 08/21/16 @ 0811 M VESTAL    Culture (A)  Final    STAPHYLOCOCCUS SPECIES (COAGULASE NEGATIVE) THE SIGNIFICANCE OF ISOLATING THIS ORGANISM FROM A SINGLE SET OF BLOOD CULTURES WHEN MULTIPLE SETS ARE DRAWN IS UNCERTAIN. PLEASE NOTIFY THE MICROBIOLOGY DEPARTMENT WITHIN ONE WEEK IF SPECIATION AND SENSITIVITIES ARE REQUIRED.    Report Status 08/22/2016 FINAL  Final  Blood Culture ID Panel (Reflexed)     Status: Abnormal   Collection Time: 08/20/16 12:01 AM  Result Value Ref Range Status   Enterococcus species NOT DETECTED NOT DETECTED Final   Listeria monocytogenes NOT DETECTED NOT DETECTED Final   Staphylococcus species DETECTED (A) NOT DETECTED Final    Comment: Methicillin (oxacillin) resistant coagulase negative staphylococcus. Possible blood culture contaminant (unless isolated from more than one blood culture draw or clinical case suggests pathogenicity). No antibiotic treatment is indicated for blood  culture contaminants. CRITICAL RESULT CALLED TO, READ BACK BY AND VERIFIED WITH: A JOHNSTON 08/21/16 @ 0811 M VESTAL    Staphylococcus aureus NOT DETECTED NOT DETECTED Final   Methicillin resistance DETECTED (A) NOT DETECTED Final    Comment: CRITICAL RESULT CALLED TO, READ BACK BY AND  VERIFIED WITH: A JOHNSTON 08/21/16 @ 0811 M VESTAL    Streptococcus species NOT DETECTED NOT DETECTED Final   Streptococcus agalactiae NOT DETECTED NOT DETECTED Final   Streptococcus pneumoniae NOT DETECTED NOT DETECTED Final   Streptococcus pyogenes NOT DETECTED NOT DETECTED Final   Acinetobacter baumannii NOT DETECTED NOT DETECTED Final   Enterobacteriaceae species NOT DETECTED NOT DETECTED Final   Enterobacter cloacae complex NOT DETECTED NOT DETECTED Final   Escherichia coli NOT DETECTED NOT DETECTED Final   Klebsiella oxytoca NOT DETECTED NOT DETECTED Final   Klebsiella pneumoniae NOT DETECTED NOT DETECTED Final   Proteus species NOT DETECTED NOT  DETECTED Final   Serratia marcescens NOT DETECTED NOT DETECTED Final   Haemophilus influenzae NOT DETECTED NOT DETECTED Final   Neisseria meningitidis NOT DETECTED NOT DETECTED Final   Pseudomonas aeruginosa NOT DETECTED NOT DETECTED Final   Candida albicans NOT DETECTED NOT DETECTED Final   Candida glabrata NOT DETECTED NOT DETECTED Final   Candida krusei NOT DETECTED NOT DETECTED Final   Candida parapsilosis NOT DETECTED NOT DETECTED Final   Candida tropicalis NOT DETECTED NOT DETECTED Final  Blood culture (routine x 2)     Status: None (Preliminary result)   Collection Time: 08/20/16 12:30 AM  Result Value Ref Range Status   Specimen Description BLOOD LEFT HAND  Final   Special Requests IN PEDIATRIC BOTTLE Blood Culture adequate volume  Final   Culture NO GROWTH 4 DAYS  Final   Report Status PENDING  Incomplete  Urine culture     Status: None   Collection Time: 08/20/16 12:56 AM  Result Value Ref Range Status   Specimen Description URINE, CATHETERIZED  Final   Special Requests NONE  Final   Culture NO GROWTH  Final   Report Status 08/21/2016 FINAL  Final  MRSA PCR Screening     Status: None   Collection Time: 08/20/16  3:30 AM  Result Value Ref Range Status   MRSA by PCR NEGATIVE NEGATIVE Final    Comment:        The  GeneXpert MRSA Assay (FDA approved for NASAL specimens only), is one component of a comprehensive MRSA colonization surveillance program. It is not intended to diagnose MRSA infection nor to guide or monitor treatment for MRSA infections.   Respiratory Panel by PCR     Status: None   Collection Time: 08/20/16  6:47 PM  Result Value Ref Range Status   Adenovirus NOT DETECTED NOT DETECTED Final   Coronavirus 229E NOT DETECTED NOT DETECTED Final   Coronavirus HKU1 NOT DETECTED NOT DETECTED Final   Coronavirus NL63 NOT DETECTED NOT DETECTED Final   Coronavirus OC43 NOT DETECTED NOT DETECTED Final   Metapneumovirus NOT DETECTED NOT DETECTED Final   Rhinovirus / Enterovirus NOT DETECTED NOT DETECTED Final   Influenza A NOT DETECTED NOT DETECTED Final   Influenza B NOT DETECTED NOT DETECTED Final   Parainfluenza Virus 1 NOT DETECTED NOT DETECTED Final   Parainfluenza Virus 2 NOT DETECTED NOT DETECTED Final   Parainfluenza Virus 3 NOT DETECTED NOT DETECTED Final   Parainfluenza Virus 4 NOT DETECTED NOT DETECTED Final   Respiratory Syncytial Virus NOT DETECTED NOT DETECTED Final   Bordetella pertussis NOT DETECTED NOT DETECTED Final   Chlamydophila pneumoniae NOT DETECTED NOT DETECTED Final   Mycoplasma pneumoniae NOT DETECTED NOT DETECTED Final  Culture, respiratory (NON-Expectorated)     Status: None   Collection Time: 08/21/16  1:34 AM  Result Value Ref Range Status   Specimen Description TRACHEAL ASPIRATE  Final   Special Requests Normal  Final   Gram Stain   Final    FEW WBC PRESENT,BOTH PMN AND MONONUCLEAR RARE GRAM POSITIVE COCCI IN PAIRS IN CLUSTERS    Culture FEW CANDIDA ALBICANS  Final   Report Status 08/23/2016 FINAL  Final  Culture, blood (Routine X 2) w Reflex to ID Panel     Status: None (Preliminary result)   Collection Time: 08/21/16 12:30 PM  Result Value Ref Range Status   Specimen Description BLOOD RIGHT HAND  Final   Special Requests IN PEDIATRIC BOTTLE Blood  Culture adequate volume  Final  Culture NO GROWTH 3 DAYS  Final   Report Status PENDING  Incomplete  Culture, blood (Routine X 2) w Reflex to ID Panel     Status: None (Preliminary result)   Collection Time: 08/21/16 12:40 PM  Result Value Ref Range Status   Specimen Description BLOOD RIGHT HAND  Final   Special Requests IN PEDIATRIC BOTTLE Blood Culture adequate volume  Final   Culture NO GROWTH 3 DAYS  Final   Report Status PENDING  Incomplete     Labs: Basic Metabolic Panel:  Recent Labs Lab 08/20/16 0306 08/21/16 0545 08/22/16 0552 08/23/16 0720 08/24/16 0441  NA 137 140 141 139 140  K 4.1 3.5 4.2 3.9 3.7  CL 105 101 101 98* 99*  CO2 24 28 31  35* 34*  GLUCOSE 197* 94 95 98 82  BUN 20 31* 30* 21* 19  CREATININE 0.95 1.25* 0.93 0.71 0.77  CALCIUM 8.1* 8.4* 8.9 9.1 9.0  MG 1.5* 2.5*  --  1.8  --   PHOS 5.2* 4.6  --  2.6  --    Liver Function Tests:  Recent Labs Lab 08/21/16 0545  AST 27  ALT 19  ALKPHOS 62  BILITOT 0.5  PROT 5.5*  ALBUMIN 2.9*   No results for input(s): LIPASE, AMYLASE in the last 168 hours. No results for input(s): AMMONIA in the last 168 hours. CBC:  Recent Labs Lab 08/20/16 0013 08/20/16 0306 08/21/16 0545 08/22/16 0552 08/23/16 0720 08/24/16 0441  WBC 20.6* 14.6* 12.2* 12.8* 12.6* 11.1*  NEUTROABS 11.8*  --   --   --   --   --   HGB 11.1* 9.9* 8.9* 9.4* 10.1* 9.4*  HCT 35.8* 31.7* 27.5* 30.1* 33.1* 30.5*  MCV 105.9* 106.4* 101.9* 103.1* 103.8* 102.3*  PLT 319 251 243 300 367 331   Cardiac Enzymes:  Recent Labs Lab 08/20/16 0013 08/20/16 1803 08/20/16 2358 08/21/16 0545  TROPONINI 0.11* 0.13* 0.13* 0.14*   BNP: BNP (last 3 results)  Recent Labs  05/11/16 1621 06/19/16 1920 08/20/16 0013  BNP 439.1* 285.3* 2,074.8*    ProBNP (last 3 results) No results for input(s): PROBNP in the last 8760 hours.  CBG:  Recent Labs Lab 08/23/16 2014 08/24/16 0004 08/24/16 0356 08/24/16 0738 08/24/16 1127  GLUCAP 161*  157* 74 121* 130*       Signed:  Mauro Kaufmann S MD.  [facility] 08/24/2016, 2:01 PM

## 2016-08-24 NOTE — Care Management Note (Signed)
Case Management Note  Patient Details  Name: Denise Adams MRN: [id number] Date of Birth: 17-Jul-1931  Subjective/Objective:  CM following for progression and d/c planning.                  Action/Plan: 08/24/2016 Met with pt and family on 08/24/2016. Noted plan now to d/c to SNF for short term rehab. [place] working with family.   Expected Discharge Date:     08/24/2016             Expected Discharge Plan:  Skilled Nursing Facility  In-House Referral:  Clinical Social Work  Discharge planning Services  CM Consult  Post Acute Care Choice:   NA Choice offered to:   NA  DME Arranged:  N/A DME Agency:  NA  HH Arranged:  NA HH Agency:  NA  Status of Service:  Completed, signed off  If discussed at [employer] of Stay Meetings, dates discussed:    Additional Comments:  Adron Bene, RN 08/24/2016, 12:20 PM

## 2016-08-24 NOTE — Clinical Social Work Note (Signed)
Clinical Social Work Assessment  Patient Details  Name: Denise Adams MRN: [id number] Date of Birth: 10-06-1931  Date of referral:  08/24/16               Reason for consult:  Facility Placement                Permission sought to share information with:  Family Supports Permission granted to share information::  Yes, Verbal Permission Granted  Name::     Denise BickersDeborah Adams  Agency::     Relationship::  Daughter and Armed forces technical officerHCPOA  Contact Information:  [phone] - mobile  Housing/Transportation Living arrangements for the past 2 months:  Single Family Home (After hospital discharge in March, patient and her daughter Denise Adams went to stay with her daughter Denise [place] who [place] in [place]) Source of Information:  Patient, Adult Children Patient Interpreter Needed:  None Criminal Activity/Legal Involvement Pertinent to Current Situation/Hospitalization:  No - Comment as needed Significant Relationships:  Adult Children, Other Family Members Lives with:  Adult Children (Patient and daughter Denise Adams live together) Do you feel safe going back to the place where you live?  No (Patient in agreement with ST rehab) Need for family participation in patient care:  Yes (Comment)  Care giving concerns:  Patient and daughter in agreement with ST rehab as patient is weak and worked with PT for the first time today (19).   Social Worker assessment / plan:  CSW talked with patient and family (Daughter Denise Adams, son-in-law Denise Adams, and son Denise Adams) at the bedside. Daughter Denise Adams is HCPOA and participated in conversation along with patient. [name] was lying in bed and was awake, alert pleasant, and participated in conversation regarding discharge disposition.  CSW advised that daughter Denise Adams lives with patient and after hospital stay in March, they both went to [facility] house for awhile. Denise's facility preference is [facility] as this is close to where she lives and patient also wants this facility.  Denise Adams indicated that after rehab her mother will return to her (Denise's) home for awhile before going back to her home in [place].    Employment status:  Retired Health and safety inspectornsurance information:  Systems analystManaged Medicare ([employer] Medicare) PT Recommendations:  Skilled Nursing Facility Information / Referral to community resources:  [facility] ([facility]  Patient/Family's Response to care:  Patient did mention that she was not seen by physical therapy until today and thinks that she should have been seen sooner.  Patient/Family's Understanding of and Emotional Response to Diagnosis, Current Treatment, and Prognosis:  Not discussed.  Emotional Assessment Appearance:  Appears stated age Attitude/Demeanor/Rapport:  Other (Appropriate) Affect (typically observed):  Appropriate, Pleasant Orientation:  Oriented to Self, Oriented to Place, Oriented to  Time, Oriented to Situation Alcohol / Substance use:  Tobacco Use, Alcohol Use, Illicit Drugs (Patient reported that she smokes and does not drink or use illicit drugs) Psych involvement (Current and /or in the community):     Discharge Needs  Concerns to be addressed:  Discharge Planning Concerns Readmission within the last 30 days:  No Current discharge risk:  None Barriers to Discharge:  No Barriers Identified   Cristobal GoldmannCrawford, [name], LCSW 08/24/2016, 12:43 PM

## 2016-08-25 ENCOUNTER — Encounter: Payer: Self-pay | Admitting: *Deleted

## 2016-08-25 LAB — CULTURE, BLOOD (ROUTINE X 2)
Culture: NO GROWTH
SPECIAL REQUESTS: ADEQUATE

## 2016-08-25 NOTE — Telephone Encounter (Signed)
lmtcb x1 for pt. 

## 2016-08-26 LAB — CULTURE, BLOOD (ROUTINE X 2)
CULTURE: NO GROWTH
Culture: NO GROWTH
SPECIAL REQUESTS: ADEQUATE
SPECIAL REQUESTS: ADEQUATE

## 2016-08-28 ENCOUNTER — Non-Acute Institutional Stay (SKILLED_NURSING_FACILITY): Payer: Medicare Other | Admitting: Internal Medicine

## 2016-08-28 ENCOUNTER — Ambulatory Visit: Payer: Self-pay | Admitting: *Deleted

## 2016-08-28 ENCOUNTER — Encounter: Payer: Self-pay | Admitting: Internal Medicine

## 2016-08-28 DIAGNOSIS — R531 Weakness: Secondary | ICD-10-CM

## 2016-08-28 DIAGNOSIS — E119 Type 2 diabetes mellitus without complications: Secondary | ICD-10-CM

## 2016-08-28 DIAGNOSIS — J441 Chronic obstructive pulmonary disease with (acute) exacerbation: Secondary | ICD-10-CM

## 2016-08-28 DIAGNOSIS — I350 Nonrheumatic aortic (valve) stenosis: Secondary | ICD-10-CM | POA: Diagnosis not present

## 2016-08-28 DIAGNOSIS — I5032 Chronic diastolic (congestive) heart failure: Secondary | ICD-10-CM | POA: Diagnosis not present

## 2016-08-28 DIAGNOSIS — E43 Unspecified severe protein-calorie malnutrition: Secondary | ICD-10-CM | POA: Diagnosis not present

## 2016-08-28 DIAGNOSIS — G8929 Other chronic pain: Secondary | ICD-10-CM

## 2016-08-28 DIAGNOSIS — D638 Anemia in other chronic diseases classified elsewhere: Secondary | ICD-10-CM | POA: Diagnosis not present

## 2016-08-28 DIAGNOSIS — M25512 Pain in left shoulder: Secondary | ICD-10-CM

## 2016-08-28 DIAGNOSIS — F418 Other specified anxiety disorders: Secondary | ICD-10-CM | POA: Diagnosis not present

## 2016-08-28 DIAGNOSIS — K219 Gastro-esophageal reflux disease without esophagitis: Secondary | ICD-10-CM | POA: Diagnosis not present

## 2016-08-28 DIAGNOSIS — D72829 Elevated white blood cell count, unspecified: Secondary | ICD-10-CM

## 2016-08-28 DIAGNOSIS — J9611 Chronic respiratory failure with hypoxia: Secondary | ICD-10-CM

## 2016-08-28 NOTE — Progress Notes (Signed)
LOCATION: [facility]  PCP: Chilton Greathouse, MD   Code Status: DNR  Goals of care: Advanced Directive information Advanced Directives 08/28/2016  Does Patient Have a Medical Advance Directive? Yes  Type of Advance Directive Out of facility DNR (pink MOST or yellow form)  Does patient want to make changes to medical advance directive? No - Patient declined  Would patient like information on creating a medical advance directive? -       Extended Emergency Contact Information Primary Emergency Contact: Isley,Deborah Address: [address]          [place] [address] [place] Home Phone: [phone] Mobile Phone: [phone] Relation: Daughter Secondary Emergency Contact: [place] Home Phone: [phone] Relation: Son   No Known Allergies  Chief Complaint  Patient presents with  . New Admit To SNF    New Admission Visit      HPI:  Patient is a 83 y.o. female seen today for short term rehabilitation post hospital admission from 08/19/16-08/24/16 with acute respiratory failure with COPD exacerbation and acute on chronic diastolic chf exacerbation. She required intubation, steroids, diuresis and antibiotics. She has PMH of severe aortic stenosis, COPD, chronic diastolic CHF, DM, HTN among others. She is seen in her room today. Her daughter and son in law are at bedside.   Review of Systems:  Constitutional: Negative for fever, chills, diaphoresis. Energy level is slowly coming back. HENT: Negative for headache, congestion, nasal discharge,difficulty swallowing. Positive for soreness to back of throat from intubation.  Eyes: Negative for double vision and discharge. Wears glasses.  Respiratory: Negative for shortness of breath and wheezing. Positive for cough with white phlegm.  Cardiovascular: Negative for chest pain, palpitations, leg swelling.  Gastrointestinal: Negative for nausea, vomiting, abdominal pain, loss of  appetite, melena, diarrhea and constipation. Positive for heartburn. Last bowel movement was yesterday.  Genitourinary: Negative for dysuria.  Musculoskeletal: Negative for back pain, fall in the facility. Positive for left shoulder pain.   Skin: Negative for itching, rash.  Neurological: Negative for dizziness. Psychiatric/Behavioral: Negative for depression   Past Medical History:  Diagnosis Date  . Aortic stenosis   . COPD (chronic obstructive pulmonary disease) (HCC)   . Diabetes mellitus   . HLD (hyperlipidemia)   . HTN (hypertension)   . Myocardial infarction (HCC)   . PVD (peripheral vascular disease) (HCC)    Past Surgical History:  Procedure Laterality Date  . ABDOMINAL HYSTERECTOMY    . APPENDECTOMY    . CAROTID ENDARTERECTOMY     Left  . CATARACT EXTRACTION    . FOOT SURGERY     right  . TONSILLECTOMY     Social History:   reports that she has been smoking Cigarettes.  She has a 45.00 pack-year smoking history. She has never used smokeless tobacco. She reports that she does not drink alcohol or use drugs.  Family History  Problem Relation Age of Onset  . Coronary artery disease Father   . Coronary artery disease Brother 71  . Coronary artery disease Brother 74    Medications: Allergies as of 08/28/2016   No Known Allergies     Medication List       Accurate as of 08/28/16  1:05 PM. Always use your most recent med list.          albuterol 108 (90 Base) MCG/ACT inhaler Commonly known as:  PROVENTIL HFA;VENTOLIN HFA Inhale 2 puffs into the lungs every 6 (six) hours as needed  for shortness of breath.   aspirin EC 81 MG tablet Take 81 mg by mouth daily.   budesonide-formoterol 160-4.5 MCG/ACT inhaler Commonly known as:  SYMBICORT Inhale 2 puffs into the lungs 2 (two) times daily.   cyanocobalamin 1000 MCG tablet Take 1,000 mcg by mouth daily. Reported on 09/21/2015   escitalopram 5 MG tablet Commonly known as:  LEXAPRO Take 5 mg by mouth at  bedtime.   furosemide 20 MG tablet Commonly known as:  LASIX Take 1 tablet (20 mg total) by mouth every other day.   guaiFENesin 600 MG 12 hr tablet Commonly known as:  MUCINEX Take 1 tablet (600 mg total) by mouth 2 (two) times daily as needed.   HYDROcodone-acetaminophen 7.5-325 MG tablet Commonly known as:  NORCO Take 1 tablet by mouth every 6 (six) hours as needed for moderate pain.   insulin aspart 100 UNIT/ML injection Commonly known as:  novoLOG Inject 0-9 Units into the skin every 4 (four) hours. Sliding scale insulin Less than 70 initiate hypoglycemia protocol 70-120  0 units 120-150 1 unit 151-200 2 units 201-250 3 units 251-300 5 units 301-350 7 units 351-400 9 units  Greater than 400 call MD   ipratropium-albuterol 0.5-2.5 (3) MG/3ML Soln Commonly known as:  DUONEB Take 3 mLs by nebulization every 4 (four) hours as needed.   Krill Oil 1000 MG Caps Take 1,000 mg by mouth daily.   LORazepam 0.5 MG tablet Commonly known as:  ATIVAN Take 1 tablet (0.5 mg total) by mouth every 8 (eight) hours as needed for anxiety.   methocarbamol 500 MG tablet Commonly known as:  ROBAXIN Take 1 tablet (500 mg total) by mouth every 8 (eight) hours as needed for muscle spasms.   ondansetron 4 MG tablet Commonly known as:  ZOFRAN Take 4 mg by mouth every 6 (six) hours as needed. for nausea   pantoprazole 40 MG tablet Commonly known as:  PROTONIX Take 40 mg by mouth every evening.   predniSONE 10 MG tablet Commonly known as:  DELTASONE Prednisone 40 mg po daily x 3 day then Prednisone 30 mg po daily x 3 day then Prednisone 20 mg po daily x 3day then Prednisone 10 mg daily x 3 day then stop...   PRESERVISION AREDS PO Take 1 tablet by mouth 2 (two) times daily.   rOPINIRole 0.5 MG tablet Commonly known as:  REQUIP Take 1 tablet (0.5 mg total) by mouth at bedtime.   UNABLE TO FIND Med Name: Med pass 120 cc by mouth 2 times daily in between meals   Vitamin D 2000 units  Caps Take 2,000 Units by mouth daily.       Immunizations: Immunization History  Administered Date(s) Administered  . PPD Test 08/24/2016     Physical Exam: Vitals:   08/28/16 1244  BP: (!) 147/63  Pulse: 87  Resp: 18  Temp: 98.2 F (36.8 C)  TempSrc: Oral  SpO2: 97%  Weight: 99 lb 9.6 oz (45.2 kg)  Height: 5\' 2"  (1.575 m)   Body mass index is 18.22 kg/m.  General- elderly female, frail and thin built, in no acute distress Head- normocephalic, atraumatic Nose- no maxillary or frontal sinus tenderness, no nasal discharge Throat- moist mucus membrane, normal oropharynx, has dentures  Eyes- PERRLA, EOMI, no pallor, no icterus, no discharge, normal conjunctiva, normal sclera Neck- no cervical lymphadenopathy Cardiovascular- normal s1,s2, no murmur Respiratory- bilateral poor air movement, + wheeze, no rhonchi, no crackles, using her accessory muscles, on 2 liters oxygen  by nasal canula Abdomen- bowel sounds present, soft, non tender, no guarding or rigidity Musculoskeletal- able to move all 4 extremities, limited left shoulder range of motion, generalized weakness, no leg edema, severe arthritis changes to her fingers Neurological- alert and oriented to person, place and time Skin- warm and dry Psychiatry- normal mood and affect    Labs reviewed: Basic Metabolic Panel:  Recent Labs  16/10/96 0306 08/21/16 0545 08/22/16 0552 08/23/16 0720 08/24/16 0441  NA 137 140 141 139 140  K 4.1 3.5 4.2 3.9 3.7  CL 105 101 101 98* 99*  CO2 24 28 31  35* 34*  GLUCOSE 197* 94 95 98 82  BUN 20 31* 30* 21* 19  CREATININE 0.95 1.25* 0.93 0.71 0.77  CALCIUM 8.1* 8.4* 8.9 9.1 9.0  MG 1.5* 2.5*  --  1.8  --   PHOS 5.2* 4.6  --  2.6  --    Liver Function Tests:  Recent Labs  05/12/16 0650 06/19/16 1920 08/21/16 0545  AST 23 30 27   ALT 14 19 19   ALKPHOS 65 83 62  BILITOT 1.4* 0.9 0.5  PROT 5.3* 5.7* 5.5*  ALBUMIN 2.5* 3.2* 2.9*   No results for input(s): LIPASE,  AMYLASE in the last 8760 hours. No results for input(s): AMMONIA in the last 8760 hours. CBC:  Recent Labs  05/11/16 1647  06/19/16 1920  08/20/16 0013  08/22/16 0552 08/23/16 0720 08/24/16 0441  WBC 23.5*  < > 29.0*  < > 20.6*  < > 12.8* 12.6* 11.1*  NEUTROABS 21.3*  --  25.8*  --  11.8*  --   --   --   --   HGB 12.2  < > 11.3*  < > 11.1*  < > 9.4* 10.1* 9.4*  HCT 35.8*  < > 33.9*  < > 35.8*  < > 30.1* 33.1* 30.5*  MCV 96.2  < > 98.0  < > 105.9*  < > 103.1* 103.8* 102.3*  PLT 366  < > 378  < > 319  < > 300 367 331  < > = values in this interval not displayed. Cardiac Enzymes:  Recent Labs  08/20/16 1803 08/20/16 2358 08/21/16 0545  TROPONINI 0.13* 0.13* 0.14*   BNP: Invalid input(s): POCBNP CBG:  Recent Labs  08/24/16 0356 08/24/16 0738 08/24/16 1127  GLUCAP 74 121* 130*    Radiological Exams: Dg Chest Port 1 View  Result Date: 08/22/2016 CLINICAL DATA:  83 year old female with respiratory failure post extubation. Subsequent encounter. EXAM: PORTABLE CHEST 1 VIEW COMPARISON:  08/21/2016. FINDINGS: Endotracheal tube and nasogastric tube removed. Mild pulmonary edema superimposed upon chronic changes. Persistent consolidation retrocardiac region may represent atelectasis or infiltrate. Recommend follow-up until clearance. Biapical parenchymal changes greater on left without associated bony destruction. Findings have progressed slightly compared to 2011 exam. Stability can be assessed on follow-up. Calcified aorta. IMPRESSION: Endotracheal tube and nasogastric tube and been removed. Pulmonary edema superimposed upon chronic changes once again noted. Small pleural effusions suspected. Retrocardiac opacity may represent atelectasis or infiltrate. Biapical parenchymal changes suggestive of scar have progressed slightly since 2011 exam. Stability can be confirmed on follow-up. Electronically Signed   By: Lacy Duverney M.D.   On: 08/22/2016 07:39   Dg Chest Port 1 View  Result  Date: 08/21/2016 CLINICAL DATA:  83 year old female with respiratory failure. Subsequent encounter. EXAM: PORTABLE CHEST 1 VIEW COMPARISON:  08/20/2016. FINDINGS: Endotracheal tube tip 4.6 cm above the carina. Nasogastric tube courses below the diaphragm. Tip is not included on  the present exam. Mild pulmonary edema superimposed upon chronic lung changes. Medial left base consolidation may represent crowding of markings/ atelectasis and less likely infiltrate. Heart size top-normal. Calcified aorta. Bilateral shoulder joint degenerative changes. Apical pleural thickening greater on left without associated bony destruction. IMPRESSION: Mild pulmonary edema superimposed upon chronic lung changes. Medial left base consolidation may represent crowding of markings/ atelectasis and less likely infiltrate. Electronically Signed   By: Lacy Duverney M.D.   On: 08/21/2016 07:16   Dg Chest Port 1 View  Result Date: 08/20/2016 CLINICAL DATA:  Et tube present COPD Diabetes HTN PVD EXAM: PORTABLE CHEST 1 VIEW COMPARISON:  Chest x-rays dated 08/20/2016 and 06/19/2016. FINDINGS: Endotracheal tube well positioned with tip approximately 3 cm above the level of the carina. Enteric tube passes below the diaphragm. Mild cardiomegaly is stable. Atherosclerotic changes noted at the aortic arch. Mild perihilar edema, left greater than right, appears improved compared to the earlier exam suggesting improved fluid status. Lungs are hyperexpanded suggesting COPD. No pleural effusion or pneumothorax seen. IMPRESSION: 1. Decreased interstitial edema compared to the chest x-ray from earlier today, suggesting improved fluid status. 2. Hyperexpanded lungs suggesting COPD. 3. Support apparatus appears appropriately positioned. 4. Aortic atherosclerosis. Electronically Signed   By: Bary Richard M.D.   On: 08/20/2016 09:50   Dg Chest Port 1 View  Result Date: 08/20/2016 CLINICAL DATA:  Acute onset of respiratory distress. Endotracheal tube  and orogastric tube placement. Initial encounter. EXAM: PORTABLE CHEST 1 VIEW COMPARISON:  Chest radiograph performed 06/19/2016 FINDINGS: The patient's endotracheal tube is seen ending 7 cm above the carina. An enteric tube is noted extending over the body of the stomach. Vascular congestion is noted. Bilateral central airspace opacities raise concern for pulmonary edema. No significant pleural effusion or pneumothorax is seen. The cardiomediastinal silhouette is borderline enlarged. No acute osseous abnormalities are identified. IMPRESSION: 1. Endotracheal tube seen ending 7 cm above the carina. This could be advanced 3 cm, as deemed clinically appropriate. 2. Vascular congestion and borderline cardiomegaly. Bilateral central airspace opacities raise concern for pulmonary edema. Electronically Signed   By: Roanna Raider M.D.   On: 08/20/2016 00:41    Assessment/Plan  Generalized weakness From physical deconditioning. Will have patient work with PT/OT as tolerated to regain strength and restore function.  Fall precautions are in place. Given her medical co-morbidities and deconditioning, she is a high risk for re-admission. Get palliative care consult to review goals of care  Copd exacerbation Recent exacerbation with respiratory failure. Will make pulmonology follow up appointment. Continue guaifenesin q12h prn cough. Continue symbicort bid. Continue and complete prednisone slow tapering course. Continue oxygen by nasal canula. Currently on duoneb q4h prn dyspnea. Change this tid scheduled for [redacted] week along with prn.  Chronic diastolic chf Recent exacerbation. Continue lasix but change to qod per d/c recs. Check bmp  Severe aortic stenosis Will need TAVR but does not appear to be a surgical candidiate, make cardiology follow up for further management  Chronic respiratory failure Continue oxygen by nasal canula and bronchodilators  Leukocytosis Afebrile, currently on prednisone. Monitor wbc  curve  Anemia of chronic disease Monitor cbc  Severe protein calorie malnutrition RD consult, weekly weight. Her severe COPD is also contributing to this. Encourage po intake.   Left shoulder pain Continue hydrocodone-APAP 7.5-325 mg q6h prn pain. PMR consult.  gerd Continue protonix for now and advised to sit upright with meals  Depression and anxiety C/w lexapro and prn lorazepam  Type 2 DM  No results found for: HGBA1C  Check cbg qid and provide SSI. Check a1c.   Goals of care: short term rehabilitation   Labs/tests ordered: cbc, cmp 08/30/16  Family/ staff Communication: reviewed care plan with patient, her family and nursing supervisor    Oneal Grout, MD Internal Medicine [facility] [address] [place], [place] [address] Cell Phone (Monday-Friday 8 am - 5 pm): [phone] On Call: [phone] and follow prompts after 5 pm and on weekends Office Phone: [phone] Office Fax: [phone]

## 2016-08-28 NOTE — Telephone Encounter (Signed)
lmtcb x2 for pt. 

## 2016-08-29 LAB — BASIC METABOLIC PANEL
BUN: 20 mg/dL (ref 4–21)
CREATININE: 0.6 mg/dL (ref 0.5–1.1)
Glucose: 86 mg/dL
POTASSIUM: 4.1 mmol/L (ref 3.4–5.3)
SODIUM: 143 mmol/L (ref 137–147)

## 2016-08-29 LAB — CBC AND DIFFERENTIAL
HEMATOCRIT: 32 % — AB (ref 36–46)
Hemoglobin: 10.2 g/dL — AB (ref 12.0–16.0)
Platelets: 435 10*3/uL — AB (ref 150–399)
WBC: 12.7 10^3/mL

## 2016-08-29 LAB — HEPATIC FUNCTION PANEL
ALK PHOS: 62 U/L (ref 25–125)
ALT: 25 U/L (ref 7–35)
AST: 18 U/L (ref 13–35)
BILIRUBIN, TOTAL: 0.5 mg/dL

## 2016-08-30 ENCOUNTER — Encounter: Payer: Self-pay | Admitting: Family

## 2016-08-30 ENCOUNTER — Other Ambulatory Visit: Payer: Self-pay | Admitting: *Deleted

## 2016-08-30 ENCOUNTER — Encounter: Payer: Self-pay | Admitting: Acute Care

## 2016-08-30 ENCOUNTER — Ambulatory Visit (INDEPENDENT_AMBULATORY_CARE_PROVIDER_SITE_OTHER): Payer: Medicare Other | Admitting: Acute Care

## 2016-08-30 ENCOUNTER — Non-Acute Institutional Stay (SKILLED_NURSING_FACILITY): Payer: Medicare Other | Admitting: Family

## 2016-08-30 ENCOUNTER — Encounter: Payer: Self-pay | Admitting: *Deleted

## 2016-08-30 DIAGNOSIS — E43 Unspecified severe protein-calorie malnutrition: Secondary | ICD-10-CM | POA: Diagnosis not present

## 2016-08-30 DIAGNOSIS — E440 Moderate protein-calorie malnutrition: Secondary | ICD-10-CM

## 2016-08-30 DIAGNOSIS — J9600 Acute respiratory failure, unspecified whether with hypoxia or hypercapnia: Secondary | ICD-10-CM | POA: Diagnosis not present

## 2016-08-30 DIAGNOSIS — D72829 Elevated white blood cell count, unspecified: Secondary | ICD-10-CM | POA: Diagnosis not present

## 2016-08-30 DIAGNOSIS — J9611 Chronic respiratory failure with hypoxia: Secondary | ICD-10-CM

## 2016-08-30 DIAGNOSIS — D509 Iron deficiency anemia, unspecified: Secondary | ICD-10-CM

## 2016-08-30 NOTE — Assessment & Plan Note (Signed)
Continue to work on eating a well-balanced diet. Consider Ensure meal supplements

## 2016-08-30 NOTE — Progress Notes (Signed)
Location:  [facility] Room Number: 407P Place of Service:  SNF (31) Provider: Dinah Ngetich FNP-C  Chilton Greathouse, MD  Patient Care Team: Chilton Greathouse, MD as PCP - General (Internal Medicine) Lewayne Bunting, MD as Consulting Physician (Cardiology) Ernesto Rutherford, MD as Consulting Physician (Ophthalmology) Greggory Stallion, Fanny Dance, LCSW as [facility] Management  Extended Emergency Contact Information Primary Emergency Contact: Isley,Deborah Address: [address]          [place] [address] [place] Home Phone: [phone] Mobile Phone: [phone] Relation: Daughter Secondary Emergency Contact: [place] Home Phone: [phone] Relation: Son  Code Status:  DNR  Goals of care: Advanced Directive information Advanced Directives 08/30/2016  Does Patient Have a Medical Advance Directive? Yes  Type of Advance Directive Out of facility DNR (pink MOST or yellow form)  Does patient want to make changes to medical advance directive? -  Would patient like information on creating a medical advance directive? -  Pre-existing out of facility DNR order (yellow form or pink MOST form) Yellow form placed in chart (order not valid for inpatient use)     Chief Complaint  Patient presents with  . Acute Visit    Abnormal labs    HPI:  Pt is a 83 y.o. female seen today at [facility] for an acute visit for evaluation of abnormal lab results.She is seen in her room today with POA at bedside. She complains of left shoulder pain states PMR specialist ordered pain patch for her and X-ray ordered. Awaiting X-ray results. Her recent lab results WBC 12.7, Hgb 10.2, HCT 31.9, Hgb 5.6, TP 5.5 ( 08/29/2016).    Past Medical History:  Diagnosis Date  . Aortic stenosis   . COPD (chronic obstructive pulmonary disease) (HCC)   . Diabetes mellitus   . HLD (hyperlipidemia)   . HTN  (hypertension)   . Myocardial infarction (HCC)   . PVD (peripheral vascular disease) (HCC)    Past Surgical History:  Procedure Laterality Date  . ABDOMINAL HYSTERECTOMY    . APPENDECTOMY    . CAROTID ENDARTERECTOMY     Left  . CATARACT EXTRACTION    . FOOT SURGERY     right  . TONSILLECTOMY      No Known Allergies  Allergies as of 08/30/2016   No Known Allergies     Medication List       Accurate as of 08/30/16  1:54 PM. Always use your most recent med list.          albuterol 108 (90 Base) MCG/ACT inhaler Commonly known as:  PROVENTIL HFA;VENTOLIN HFA Inhale 2 puffs into the lungs every 6 (six) hours as needed for shortness of breath.   aspirin EC 81 MG tablet Take 81 mg by mouth daily.   budesonide-formoterol 160-4.5 MCG/ACT inhaler Commonly known as:  SYMBICORT Inhale 2 puffs into the lungs 2 (two) times daily.   cyanocobalamin 1000 MCG tablet Take 1,000 mcg by mouth daily. Reported on 09/21/2015   diclofenac sodium 1 % Gel Commonly known as:  VOLTAREN Apply 2 g topically 4 (four) times daily. To left shoulder   escitalopram 5 MG tablet Commonly known as:  LEXAPRO Take 5 mg by mouth at bedtime.   furosemide 20 MG tablet Commonly known as:  LASIX Take 1 tablet (20 mg total) by mouth every other day.   guaiFENesin 600 MG 12 hr tablet Commonly known as:  MUCINEX  Take 1 tablet (600 mg total) by mouth 2 (two) times daily as needed.   HYDROcodone-acetaminophen 5-325 MG tablet Commonly known as:  NORCO/VICODIN Take 1 tablet by mouth every 6 (six) hours.   HYDROcodone-acetaminophen 7.5-325 MG tablet Commonly known as:  NORCO Take 1 tablet by mouth every 6 (six) hours as needed for moderate pain.   insulin aspart 100 UNIT/ML injection Commonly known as:  novoLOG Inject 0-9 Units into the skin every 4 (four) hours. Sliding scale insulin Less than 70 initiate hypoglycemia protocol 70-120  0 units 120-150 1 unit 151-200 2 units 201-250 3 units 251-300 5  units 301-350 7 units 351-400 9 units  Greater than 400 call MD   ipratropium-albuterol 0.5-2.5 (3) MG/3ML Soln Commonly known as:  DUONEB Take 3 mLs by nebulization every 4 (four) hours as needed.   ipratropium-albuterol 0.5-2.5 (3) MG/3ML Soln Commonly known as:  DUONEB Take 3 mLs by nebulization 3 (three) times daily.   Krill Oil 1000 MG Caps Take 1,000 mg by mouth daily.   LORazepam 0.5 MG tablet Commonly known as:  ATIVAN Take 1 tablet (0.5 mg total) by mouth every 8 (eight) hours as needed for anxiety.   methocarbamol 500 MG tablet Commonly known as:  ROBAXIN Take 1 tablet (500 mg total) by mouth every 8 (eight) hours as needed for muscle spasms.   ondansetron 4 MG tablet Commonly known as:  ZOFRAN Take 4 mg by mouth every 6 (six) hours as needed. for nausea   pantoprazole 40 MG tablet Commonly known as:  PROTONIX Take 40 mg by mouth every evening.   predniSONE 10 MG tablet Commonly known as:  DELTASONE Prednisone 40 mg po daily x 3 day then Prednisone 30 mg po daily x 3 day then Prednisone 20 mg po daily x 3day then Prednisone 10 mg daily x 3 day then stop...   PRESERVISION AREDS PO Take 1 tablet by mouth 2 (two) times daily.   rOPINIRole 0.5 MG tablet Commonly known as:  REQUIP Take 1 tablet (0.5 mg total) by mouth at bedtime.   UNABLE TO FIND Med Name: Med pass 120 cc by mouth 2 times daily in between meals   Vitamin D 2000 units Caps Take 2,000 Units by mouth daily.       Review of Systems  Constitutional: Negative for activity change, appetite change, chills, fatigue and fever.  HENT: Negative for congestion, rhinorrhea, sinus pain, sinus pressure, sneezing and sore throat.   Eyes: Negative.   Respiratory: Negative for cough, chest tightness, shortness of breath and wheezing.   Cardiovascular: Negative for chest pain, palpitations and leg swelling.  Gastrointestinal: Negative for abdominal distention, abdominal pain, constipation, diarrhea, nausea and  vomiting.  Endocrine: Negative.   Genitourinary: Negative for dysuria, flank pain, frequency and urgency.  Musculoskeletal: Positive for gait problem.  Skin: Negative for color change, pallor and rash.  Neurological: Negative for dizziness, seizures, syncope, light-headedness and headaches.  Hematological: Does not bruise/bleed easily.  Psychiatric/Behavioral: Negative for agitation, confusion, hallucinations and sleep disturbance. The patient is not nervous/anxious.     Immunization History  Administered Date(s) Administered  . PPD Test 08/24/2016   Pertinent  Health Maintenance Due  Topic Date Due  . HEMOGLOBIN A1C  Mar 07, 1932  . FOOT EXAM  11/24/1943  . OPHTHALMOLOGY EXAM  11/24/1943  . URINE MICROALBUMIN  11/24/1943  . DEXA SCAN  11/24/1998  . PNA vac Low Risk Adult (1 of 2 - PCV13) 11/24/1998  . INFLUENZA VACCINE  11/08/2016  Vitals:   08/30/16 1243  BP: 122/64  Pulse: 68  Resp: 20  Temp: 98.8 F (37.1 C)  SpO2: 94%  Weight: 99 lb 9.6 oz (45.2 kg)  Height: 5\' 2"  (1.575 m)   Body mass index is 18.22 kg/m. Physical Exam  Constitutional: She is oriented to person, place, and time.  Thin frail elderly in no acute distress  HENT:  Head: Normocephalic.  Mouth/Throat: Oropharynx is clear and moist. No oropharyngeal exudate.  Eyes: Conjunctivae and EOM are normal. Pupils are equal, round, and reactive to light. Right eye exhibits no discharge. Left eye exhibits no discharge. No scleral icterus.  Neck: Normal range of motion. No JVD present. No thyromegaly present.  Cardiovascular: Normal rate, regular rhythm, normal heart sounds and intact distal pulses.  Exam reveals no gallop and no friction rub.   No murmur heard. Pulmonary/Chest: Effort normal. No respiratory distress. She has no wheezes. She has no rales.  Oxygen 2 Liters [place].Bilateral lower bases diminished breath sounds   Abdominal: Soft. Bowel sounds are normal. She exhibits no distension. There is no  tenderness. There is no guarding.  Musculoskeletal: She exhibits no tenderness or deformity.  Unsteady gait. Limited ROM to left shoulder due to pain   Lymphadenopathy:    She has no cervical adenopathy.  Neurological: She is oriented to person, place, and time.  Skin: Skin is warm and dry. No rash noted. No erythema. No pallor.  Psychiatric: She has a normal mood and affect.    Labs reviewed:  Recent Labs  08/20/16 0306 08/21/16 0545 08/22/16 0552 08/23/16 0720 08/24/16 0441 08/29/16  NA 137 140 141 139 140 143  K 4.1 3.5 4.2 3.9 3.7 4.1  CL 105 101 101 98* 99*  --   CO2 24 28 31  35* 34*  --   GLUCOSE 197* 94 95 98 82  --   BUN 20 31* 30* 21* 19 20  CREATININE 0.95 1.25* 0.93 0.71 0.77 0.6  CALCIUM 8.1* 8.4* 8.9 9.1 9.0  --   MG 1.5* 2.5*  --  1.8  --   --   PHOS 5.2* 4.6  --  2.6  --   --     Recent Labs  05/12/16 0650 06/19/16 1920 08/21/16 0545 08/29/16  AST 23 30 27 18   ALT 14 19 19 25   ALKPHOS 65 83 62 62  BILITOT 1.4* 0.9 0.5  --   PROT 5.3* 5.7* 5.5*  --   ALBUMIN 2.5* 3.2* 2.9*  --     Recent Labs  05/11/16 1647  06/19/16 1920  08/20/16 0013  08/22/16 0552 08/23/16 0720 08/24/16 0441 08/29/16  WBC 23.5*  < > 29.0*  < > 20.6*  < > 12.8* 12.6* 11.1* 12.7  NEUTROABS 21.3*  --  25.8*  --  11.8*  --   --   --   --   --   HGB 12.2  < > 11.3*  < > 11.1*  < > 9.4* 10.1* 9.4* 10.2*  HCT 35.8*  < > 33.9*  < > 35.8*  < > 30.1* 33.1* 30.5* 32*  MCV 96.2  < > 98.0  < > 105.9*  < > 103.1* 103.8* 102.3*  --   PLT 366  < > 378  < > 319  < > 300 367 331 435*  < > = values in this interval not displayed. Lab Results  Component Value Date   TSH 1.374 06/20/2016   No results found for: HGBA1C Lab Results  Component Value Date   CHOL 151 02/20/2011   HDL 60.40 02/20/2011   LDLCALC 74 02/20/2011   LDLDIRECT 133.8 01/04/2011   TRIG 81.0 02/20/2011   CHOLHDL 3 02/20/2011    Assessment/Plan 1. Leukocytosis WBC 12.7 (08/29/2016). Afebrile.diminished lung  sounds to bases noted. Will obtain Portable CXR Pa/Lat rule out PNA. Urine specimen for U/A and C/S rule out UTI    2. Moderate protein-calorie malnutrition  TP 5.5 ( 08/29/2016). Reports good appetite.RD consult to evaluate for protein supplement.  Family/ staff Communication: Reviewed plan of care with patient and facility Nurse supervisor  Labs/tests ordered: Portable CXR Pa/Lat rule out PNA. Urine specimen for U/A and C/S rule out UTI . Recheck CBC/diff, CMP 09/05/2016.   Caesar Bookmaninah C Ngetich, NP

## 2016-08-30 NOTE — Patient Outreach (Signed)
[facility]) Care Management  08/30/2016  Rennis ChrisKatherine M Mcglade 1931/05/31 [id number]  CSW drove out to [facility] where patient currently resides to receive short-term rehabilitative services; however, patient was unavailable.  The receptionist at the facility informed CSW that patient was not in the building, as patient was attending a physician appointment at the time of CSW's arrival.  After thorough review of patient's electronic medical record, CSW learned that patient was at [facility].  CSW left a HIPAA compliant message for patient with the receptionist and is currently awaiting a return call.  CSW will make arrangements to visit with patient at [facility] within the next week. CSW made an initial attempt to contact patient today on her cell phone to try and perform the phone assessment, as well as assess and assist with social needs and services, without success.  A HIPAA compliant message was left for patient on voicemail.  CSW is currently awaiting a return call. CSW will make a second outreach attempt within the next week, if CSW does not receive a return call from patient in the meantime. Danford BadJoanna Tamotsu Wiederholt, BSW, MSW, LCSW  Licensed Restaurant manager, fast foodClinical Social Worker  [facility] Care Management [facility]  Mailing EnterpriseAddress-[address], [place], [place] [address] Physical Address-[address], [place], [place] [address] Toll Free Main # [phone] Fax # [phone] Cell # [phone]  Office # [phone] [email or web address]

## 2016-08-30 NOTE — Progress Notes (Signed)
History of Present Illness Denise Adams is a 83 y.o. female current smoker with COPD and chronic CHF with severe aortic stenosis. She was recently seen as an inpatient by pulmonary, and is here for hospital follow up.   08/30/2016 Hospital Follow Up: Pt. Presents for follow up. She was hospitalized 5/12-5/17 with acute on chronic respiratory failure with COPD exacerbation and acute on chronic diastolic chf exacerbation/ pulmonary edema.. She required intubation, IV steroids, IV diuresis and IV antibiotics.She was discharged to a SNF on a prednisone taper.  She has PMH of severe aortic stenosis, COPD, chronic diastolic CHF, DM, HTN among others. She presents for follow up.She states she has almost completed her prednisone taper. She is compliant with her Symbicort ( She states [facility] only been giving her her Symbicort once daily , not twice), Duonebs and Mucinex. She is wearing her oxygen at 2 L [place].    ( She had run out of her oxygen prior to admission). She is doing well. She is participating in rehab at the skilled facility.She states swelling in feet and legs are better.She still has a cough, but states she is only coughing up white secretions. She denies fever, chest pain, orthopnea or hemoptysis.She is anxious to get home.The order for Duonebs TID x 1 week per Dr. Glade LloydPandey is not being implemented. The patient states she is only getting DuoNebs once daily. We will send an order to the skilled nursing home emphasizing increased frequency of DuoNeb's, and the twice a day dosing of Symbicort.  Test Results:  CBC Latest Ref Rng & Units 08/29/2016 08/24/2016 08/23/2016  WBC 10:3/mL 12.7 11.1(H) 12.6(H)  Hemoglobin 12.0 - 16.0 g/dL 10.2(A) 9.4(L) 10.1(L)  Hematocrit 36 - 46 % 32(A) 30.5(L) 33.1(L)  Platelets 150 - 399 K/L 435(A) 331 367    BMP Latest Ref Rng & Units 08/29/2016 08/24/2016 08/23/2016  Glucose 65 - 99 mg/dL - 82 98  BUN 4 - 21 mg/dL 20 19 40(J21(H)  Creatinine 0.5 - 1.1 mg/dL  0.6 8.110.77 9.140.71  Sodium 137 - 147 mmol/L 143 140 139  Potassium 3.4 - 5.3 mmol/L 4.1 3.7 3.9  Chloride 101 - 111 mmol/L - 99(L) 98(L)  CO2 22 - 32 mmol/L - 34(H) 35(H)  Calcium 8.9 - 10.3 mg/dL - 9.0 9.1    BNP    Component Value Date/Time   BNP 2,074.8 (H) 08/20/2016 0013    Dg Chest Port 1 View  Result Date: 08/22/2016 CLINICAL DATA:  83 year old female with respiratory failure post extubation. Subsequent encounter. EXAM: PORTABLE CHEST 1 VIEW COMPARISON:  08/21/2016. FINDINGS: Endotracheal tube and nasogastric tube removed. Mild pulmonary edema superimposed upon chronic changes. Persistent consolidation retrocardiac region may represent atelectasis or infiltrate. Recommend follow-up until clearance. Biapical parenchymal changes greater on left without associated bony destruction. Findings have progressed slightly compared to 2011 exam. Stability can be assessed on follow-up. Calcified aorta. IMPRESSION: Endotracheal tube and nasogastric tube and been removed. Pulmonary edema superimposed upon chronic changes once again noted. Small pleural effusions suspected. Retrocardiac opacity may represent atelectasis or infiltrate. Biapical parenchymal changes suggestive of scar have progressed slightly since 2011 exam. Stability can be confirmed on follow-up. Electronically Signed   By: Lacy DuverneySteven  Olson M.D.   On: 08/22/2016 07:39   Dg Chest Port 1 View  Result Date: 08/21/2016 CLINICAL DATA:  83 year old female with respiratory failure. Subsequent encounter. EXAM: PORTABLE CHEST 1 VIEW COMPARISON:  08/20/2016. FINDINGS: Endotracheal tube tip 4.6 cm above the carina. Nasogastric tube courses below  the diaphragm. Tip is not included on the present exam. Mild pulmonary edema superimposed upon chronic lung changes. Medial left base consolidation may represent crowding of markings/ atelectasis and less likely infiltrate. Heart size top-normal. Calcified aorta. Bilateral shoulder joint degenerative changes.  Apical pleural thickening greater on left without associated bony destruction. IMPRESSION: Mild pulmonary edema superimposed upon chronic lung changes. Medial left base consolidation may represent crowding of markings/ atelectasis and less likely infiltrate. Electronically Signed   By: Lacy Duverney M.D.   On: 08/21/2016 07:16   Dg Chest Port 1 View  Result Date: 08/20/2016 CLINICAL DATA:  Et tube present COPD Diabetes HTN PVD EXAM: PORTABLE CHEST 1 VIEW COMPARISON:  Chest x-rays dated 08/20/2016 and 06/19/2016. FINDINGS: Endotracheal tube well positioned with tip approximately 3 cm above the level of the carina. Enteric tube passes below the diaphragm. Mild cardiomegaly is stable. Atherosclerotic changes noted at the aortic arch. Mild perihilar edema, left greater than right, appears improved compared to the earlier exam suggesting improved fluid status. Lungs are hyperexpanded suggesting COPD. No pleural effusion or pneumothorax seen. IMPRESSION: 1. Decreased interstitial edema compared to the chest x-ray from earlier today, suggesting improved fluid status. 2. Hyperexpanded lungs suggesting COPD. 3. Support apparatus appears appropriately positioned. 4. Aortic atherosclerosis. Electronically Signed   By: Bary Richard M.D.   On: 08/20/2016 09:50   Dg Chest Port 1 View  Result Date: 08/20/2016 CLINICAL DATA:  Acute onset of respiratory distress. Endotracheal tube and orogastric tube placement. Initial encounter. EXAM: PORTABLE CHEST 1 VIEW COMPARISON:  Chest radiograph performed 06/19/2016 FINDINGS: The patient's endotracheal tube is seen ending 7 cm above the carina. An enteric tube is noted extending over the body of the stomach. Vascular congestion is noted. Bilateral central airspace opacities raise concern for pulmonary edema. No significant pleural effusion or pneumothorax is seen. The cardiomediastinal silhouette is borderline enlarged. No acute osseous abnormalities are identified. IMPRESSION: 1.  Endotracheal tube seen ending 7 cm above the carina. This could be advanced 3 cm, as deemed clinically appropriate. 2. Vascular congestion and borderline cardiomegaly. Bilateral central airspace opacities raise concern for pulmonary edema. Electronically Signed   By: Roanna Raider M.D.   On: 08/20/2016 00:41     Past medical hx Past Medical History:  Diagnosis Date  . Aortic stenosis   . COPD (chronic obstructive pulmonary disease) (HCC)   . Diabetes mellitus   . HLD (hyperlipidemia)   . HTN (hypertension)   . Myocardial infarction (HCC)   . PVD (peripheral vascular disease) (HCC)      Social History  Substance Use Topics  . Smoking status: Former Smoker    Packs/day: 1.25    Years: 60.00    Types: Cigarettes    Quit date: 06/19/2016  . Smokeless tobacco: Never Used  . Alcohol use No    Tobacco Cessation: I have spent 3 minutes counseling patient on smoking cessation this visit, and health risks of continued tobacco abuse.Marland Kitchen  Past surgical hx, Family hx, Social hx all reviewed.  Current Outpatient Prescriptions on File Prior to Visit  Medication Sig  . albuterol (PROVENTIL HFA;VENTOLIN HFA) 108 (90 BASE) MCG/ACT inhaler Inhale 2 puffs into the lungs every 6 (six) hours as needed for shortness of breath.   Marland Kitchen aspirin EC 81 MG tablet Take 81 mg by mouth daily.  . budesonide-formoterol (SYMBICORT) 160-4.5 MCG/ACT inhaler Inhale 2 puffs into the lungs 2 (two) times daily.  . Cholecalciferol (VITAMIN D) 2000 UNITS CAPS Take 2,000 Units by mouth daily.   Marland Kitchen  cyanocobalamin 1000 MCG tablet Take 1,000 mcg by mouth daily. Reported on 09/21/2015  . escitalopram (LEXAPRO) 5 MG tablet Take 5 mg by mouth at bedtime.   . furosemide (LASIX) 20 MG tablet Take 1 tablet (20 mg total) by mouth every other day.  Marland Kitchen guaiFENesin (MUCINEX) 600 MG 12 hr tablet Take 1 tablet (600 mg total) by mouth 2 (two) times daily as needed.  Marland Kitchen HYDROcodone-acetaminophen (NORCO) 7.5-325 MG tablet Take 1 tablet by mouth  every 6 (six) hours as needed for moderate pain.  Marland Kitchen insulin aspart (NOVOLOG) 100 UNIT/ML injection Inject 0-9 Units into the skin every 4 (four) hours. Sliding scale insulin Less than 70 initiate hypoglycemia protocol 70-120  0 units 120-150 1 unit 151-200 2 units 201-250 3 units 251-300 5 units 301-350 7 units 351-400 9 units  Greater than 400 call MD  . ipratropium-albuterol (DUONEB) 0.5-2.5 (3) MG/3ML SOLN Take 3 mLs by nebulization every 4 (four) hours as needed.  Boris Lown Oil 1000 MG CAPS Take 1,000 mg by mouth daily.  Marland Kitchen LORazepam (ATIVAN) 0.5 MG tablet Take 1 tablet (0.5 mg total) by mouth every 8 (eight) hours as needed for anxiety.  . methocarbamol (ROBAXIN) 500 MG tablet Take 1 tablet (500 mg total) by mouth every 8 (eight) hours as needed for muscle spasms.  . Multiple Vitamins-Minerals (PRESERVISION AREDS PO) Take 1 tablet by mouth 2 (two) times daily.   . ondansetron (ZOFRAN) 4 MG tablet Take 4 mg by mouth every 6 (six) hours as needed. for nausea  . pantoprazole (PROTONIX) 40 MG tablet Take 40 mg by mouth every evening.  . predniSONE (DELTASONE) 10 MG tablet Prednisone 40 mg po daily x 3 day then Prednisone 30 mg po daily x 3 day then Prednisone 20 mg po daily x 3day then Prednisone 10 mg daily x 3 day then stop...  . rOPINIRole (REQUIP) 0.5 MG tablet Take 1 tablet (0.5 mg total) by mouth at bedtime.  Marland Kitchen UNABLE TO FIND Med Name: Med pass 120 cc by mouth 2 times daily in between meals   No current facility-administered medications on file prior to visit.      No Known Allergies  Review Of Systems:  Constitutional:   No  weight loss, night sweats,  Fevers, chills, fatigue, or  lassitude.  HEENT:   No headaches,  Difficulty swallowing,  Tooth/dental problems, or  Sore throat,                No sneezing, itching, ear ache, nasal congestion, post nasal drip,   CV:  No chest pain,  Orthopnea, PND, swelling in lower extremities, anasarca, dizziness, palpitations, syncope.    GI  No heartburn, indigestion, abdominal pain, nausea, vomiting, diarrhea, change in bowel habits, loss of appetite, bloody stools.   Resp: + shortness of breath with exertion or at rest.  + excess mucus, + productive cough ( White secretions),  No non-productive cough,  No coughing up of blood.  No change in color of mucus.  Rare wheezing.  No chest wall deformity.  Skin: no rash or lesions.  GU: no dysuria, change in color of urine, no urgency or frequency.  No flank pain, no hematuria   MS:  No joint pain or swelling.  No decreased range of motion.  No back pain.  Psych:  No change in mood or affect. No depression or anxiety.  No memory loss.   Vital Signs BP (!) 114/56 (BP Location: Left Arm, Cuff Size: Normal)   Pulse  91   Ht 5' 4.5" (1.638 m)   Wt 104 lb 12.8 oz (47.5 kg)   SpO2 97%   BMI 17.71 kg/m    Physical Exam:  General- No distress,  A&Ox3,pleasant ENT: No sinus tenderness, TM clear, pale nasal mucosa, no oral exudate,no post nasal drip, no LAN Cardiac: S1, S2, regular rate and rhythm, no murmur Chest: Scant Exp. wheeze/ No rales/ dullness; no accessory muscle use, no nasal flaring, no sternal retractions Abd.: Soft Non-tender, non-distended, BS + Ext: No clubbing cyanosis, trace edema Neuro:  Severe deconditioning, in wheel chair Skin: No rashes, warm and dry Psych: normal mood and behavior   Assessment/Plan  Acute respiratory failure (HCC) Acute on chronic respiratory failure with COPD exacerbation and acute on chronic diastolic chf exacerbation/ pulmonary edema. Plan:  We will schedule you for PFT's in 6 weeks. We will ask the Nursing home to weigh you every morning. We will specify Symbicort 2 puffs twice daily every day. Rinse mouth after use. DuoNebs three times daily x 7 days, then prn. Continue wearing your oxygen  Saturation goals are 90-92% on your oxygen. Follow up with Dr. Jamison Neighbor in 3 months. Please contact office for sooner follow up if  symptoms do not improve or worsen or seek emergency care    Respiratory failure (HCC) Continue wearing oxygen at 2 L [place] Saturation goal is 88-92%  Protein-calorie malnutrition, severe Continue to work on eating a well-balanced diet. Consider Ensure meal supplements    Bevelyn Ngo, NP 08/30/2016  7:53 PM

## 2016-08-30 NOTE — Assessment & Plan Note (Signed)
Acute on chronic respiratory failure with COPD exacerbation and acute on chronic diastolic chf exacerbation/ pulmonary edema. Plan:  We will schedule you for PFT's in 6 weeks. We will ask the Nursing home to weigh you every morning. We will specify Symbicort 2 puffs twice daily every day. Rinse mouth after use. DuoNebs three times daily x 7 days, then prn. Continue wearing your oxygen  Saturation goals are 90-92% on your oxygen. Follow up with Dr. Jamison NeighborNestor in 3 months. Please contact office for sooner follow up if symptoms do not improve or worsen or seek emergency care

## 2016-08-30 NOTE — Patient Instructions (Addendum)
It is great to see you today. We will schedule you for PFT's in 6 weeks. We will ask the Nursing home to weigh you every morning. We will specify Symbicort 2 puffs twice daily every day. Rinse mouth after use. DuoNebs three times daily x 7 days, then prn. Continue wearing your oxygen  Saturation goals are 90-92% on your oxygen. Follow up with Dr. Jamison NeighborNestor in 3 months. Please contact office for sooner follow up if symptoms do not improve or worsen or seek emergency care

## 2016-08-30 NOTE — Assessment & Plan Note (Signed)
Continue wearing oxygen at 2 L [place] Saturation goal is 88-92%

## 2016-08-31 ENCOUNTER — Ambulatory Visit: Payer: Medicare Other | Admitting: Cardiology

## 2016-08-31 ENCOUNTER — Ambulatory Visit: Payer: Medicare Other | Admitting: Cardiovascular Disease

## 2016-08-31 NOTE — Progress Notes (Deleted)
Cardiology Office Note   Date:  08/31/2016   ID:  Denise Adams, DOB [birth year], MRN [id number]  PCP:  Chilton Greathouse, MD  Cardiologist:   Chilton Si, MD   No chief complaint on file.     History of Present Illness: Denise Adams is a 83 y.o. female with hypertension, hyperlipidemia, severe aortic stenosis, AAA, PAD s/p R iliac stent with in-stent restenosis, carotid stenosis s/p L CEA, COPD on home oxygen and ongoing tobacco abuse who presents for follow up.  Ms. Nicolson was admitted to the hospital 08/2016 with a COPD exacerbation and acute on chronic diastolic heart failure.  She ran out of supplemental oxygen prior to admission.  She had an echo 08/22/16 that revealed LVEF 50-55% with severe aortic stenosis (mean gradient 39 mmHg) and moderate mitral regurgitation.  She followed up with Denise Robinsons, NP of pulmonary on 5/23 and was doing well.    Past Medical History:  Diagnosis Date  . Aortic stenosis   . COPD (chronic obstructive pulmonary disease) (HCC)   . Diabetes mellitus   . HLD (hyperlipidemia)   . HTN (hypertension)   . Myocardial infarction (HCC)   . PVD (peripheral vascular disease) (HCC)     Past Surgical History:  Procedure Laterality Date  . ABDOMINAL HYSTERECTOMY    . APPENDECTOMY    . CAROTID ENDARTERECTOMY     Left  . CATARACT EXTRACTION    . FOOT SURGERY     right  . TONSILLECTOMY       Current Outpatient Prescriptions  Medication Sig Dispense Refill  . albuterol (PROVENTIL HFA;VENTOLIN HFA) 108 (90 BASE) MCG/ACT inhaler Inhale 2 puffs into the lungs every 6 (six) hours as needed for shortness of breath.     Marland Kitchen aspirin EC 81 MG tablet Take 81 mg by mouth daily.    . budesonide-formoterol (SYMBICORT) 160-4.5 MCG/ACT inhaler Inhale 2 puffs into the lungs 2 (two) times daily. 1 Inhaler 5  . Cholecalciferol (VITAMIN D) 2000 UNITS CAPS Take 2,000 Units by mouth daily.     . cyanocobalamin 1000 MCG tablet Take 1,000 mcg by mouth  daily. Reported on 09/21/2015    . diclofenac sodium (VOLTAREN) 1 % GEL Apply 2 g topically 4 (four) times daily. To left shoulder    . escitalopram (LEXAPRO) 5 MG tablet Take 5 mg by mouth at bedtime.     . furosemide (LASIX) 20 MG tablet Take 1 tablet (20 mg total) by mouth every other day. 30 tablet   . guaiFENesin (MUCINEX) 600 MG 12 hr tablet Take 1 tablet (600 mg total) by mouth 2 (two) times daily as needed. 45 tablet 0  . HYDROcodone-acetaminophen (NORCO) 7.5-325 MG tablet Take 1 tablet by mouth every 6 (six) hours as needed for moderate pain. 10 tablet 0  . HYDROcodone-acetaminophen (NORCO/VICODIN) 5-325 MG tablet Take 1 tablet by mouth every 6 (six) hours.    . insulin aspart (NOVOLOG) 100 UNIT/ML injection Inject 0-9 Units into the skin every 4 (four) hours. Sliding scale insulin Less than 70 initiate hypoglycemia protocol 70-120  0 units 120-150 1 unit 151-200 2 units 201-250 3 units 251-300 5 units 301-350 7 units 351-400 9 units  Greater than 400 call MD 10 mL 11  . ipratropium-albuterol (DUONEB) 0.5-2.5 (3) MG/3ML SOLN Take 3 mLs by nebulization every 4 (four) hours as needed. 360 mL 0  . ipratropium-albuterol (DUONEB) 0.5-2.5 (3) MG/3ML SOLN Take 3 mLs by nebulization 3 (three) times daily.    Marland Kitchen  Krill Oil 1000 MG CAPS Take 1,000 mg by mouth daily.    Marland Kitchen. LORazepam (ATIVAN) 0.5 MG tablet Take 1 tablet (0.5 mg total) by mouth every 8 (eight) hours as needed for anxiety. 10 tablet 0  . methocarbamol (ROBAXIN) 500 MG tablet Take 1 tablet (500 mg total) by mouth every 8 (eight) hours as needed for muscle spasms. 30 tablet 0  . Multiple Vitamins-Minerals (PRESERVISION AREDS PO) Take 1 tablet by mouth 2 (two) times daily.     . ondansetron (ZOFRAN) 4 MG tablet Take 4 mg by mouth every 6 (six) hours as needed. for nausea  1  . pantoprazole (PROTONIX) 40 MG tablet Take 40 mg by mouth every evening.  5  . predniSONE (DELTASONE) 10 MG tablet Prednisone 40 mg po daily x 3 day  then Prednisone 30 mg po daily x 3 day then Prednisone 20 mg po daily x 3day then Prednisone 10 mg daily x 3 day then stop...    . rOPINIRole (REQUIP) 0.5 MG tablet Take 1 tablet (0.5 mg total) by mouth at bedtime. 30 tablet 0  . UNABLE TO FIND Med Name: Med pass 120 cc by mouth 2 times daily in between meals     No current facility-administered medications for this visit.     Allergies:   Patient has no known allergies.    Social History:  The patient  reports that she quit smoking about 2 months ago. Her smoking use included Cigarettes. She has a 75.00 pack-year smoking history. She has never used smokeless tobacco. She reports that she does not drink alcohol or use drugs.   Family History:  The patient's ***family history includes Coronary artery disease in her father; Coronary artery disease (age of onset: 4454) in her brother; Coronary artery disease (age of onset: 3962) in her brother.    ROS:  Please see the history of present illness.   Otherwise, review of systems are positive for {NONE DEFAULTED:18576::"none"}.   All other systems are reviewed and negative.    PHYSICAL EXAM: VS:  There were no vitals taken for this visit. , BMI There is no height or weight on file to calculate BMI. GENERAL:  Well appearing HEENT:  Pupils equal round and reactive, fundi not visualized, oral mucosa unremarkable NECK:  No jugular venous distention, waveform within normal limits, carotid upstroke brisk and symmetric, no bruits, no thyromegaly LYMPHATICS:  No cervical adenopathy LUNGS:  Clear to auscultation bilaterally HEART:  RRR.  PMI not displaced or sustained,S1 and S2 within normal limits, no S3, no S4, no clicks, no rubs, *** murmurs ABD:  Flat, positive bowel sounds normal in frequency in pitch, no bruits, no rebound, no guarding, no midline pulsatile mass, no hepatomegaly, no splenomegaly EXT:  2 plus pulses throughout, no edema, no cyanosis no clubbing SKIN:  No rashes no nodules NEURO:   Cranial nerves II through XII grossly intact, motor grossly intact throughout PSYCH:  Cognitively intact, oriented to person place and time    EKG:  EKG {ACTION; IS/IS [id number]}OT:[id number]} ordered today. The ekg ordered today demonstrates ***  Echo 08/22/16: Study Conclusions  - Left ventricle: The cavity size was normal. Wall thickness was normal. Systolic function was normal. The estimated ejection fraction was in the range of 50% to 55%. Basal inferior hypokinesis. The study is not technically sufficient to allow evaluation of LV diastolic function. - Aortic valve: Calcified with leaflet restriction. There is severe stenosis. No significant regurgitation. Mean gradient (S): 39 mm Hg. Peak gradient (  S): 68 mm Hg. Valve area (Vmax): 0.67 cm^2. Valve area (Vmean): 0.6 cm^2. - Mitral valve: Sclerotic and tethered posterior leaflet. There is moderate posteriorly-directed mitral regurgitation. Calcified annulus. - Left atrium: Moderately dilated. - Tricuspid valve: There was mild regurgitation. - Pulmonary arteries: PA peak pressure: 27 mm Hg (S). - Systemic veins: The IVC measures <2.1 cm, but does not collapse >50%, suggesting an elevated RA pressure of 8 mmHg. - Pericardium, extracardiac: A trivial pericardial effusion was identified posterior to the heart. Features were not consistent with tamponade physiology.  Recent Labs: 06/20/2016: TSH 1.374 08/20/2016: B Natriuretic Peptide 2,074.8 08/23/2016: Magnesium 1.8 08/29/2016: ALT 25; BUN 20; Creatinine 0.6; Hemoglobin 10.2; Platelets 435; Potassium 4.1; Sodium 143    Lipid Panel    Component Value Date/Time   CHOL 151 02/20/2011 0841   TRIG 81.0 02/20/2011 0841   HDL 60.40 02/20/2011 0841   CHOLHDL 3 02/20/2011 0841   VLDL 16.2 02/20/2011 0841   LDLCALC 74 02/20/2011 0841   LDLDIRECT 133.8 01/04/2011 0827      Wt Readings from Last 3 Encounters:  08/30/16 47.5 kg (104 lb 12.8 oz)  08/30/16 45.2 kg  (99 lb 9.6 oz)  08/28/16 45.2 kg (99 lb 9.6 oz)      ASSESSMENT AND PLAN:  ***   Current medicines are reviewed at length with the patient today.  The patient {ACTIONS; HAS/DOES NOT HAVE:19233} concerns regarding medicines.  The following changes have been made:  {PLAN; NO CHANGE:13088:s}  Labs/ tests ordered today include: *** No orders of the defined types were placed in this encounter.    Disposition:   FU with ***    This note was written with the assistance of speech recognition software.  Please excuse any transcriptional errors.  Signed, Jamear Carbonneau C. Duke Salvia, MD, [facility]  08/31/2016 6:10 AM    [facility]

## 2016-08-31 NOTE — Addendum Note (Signed)
Addended by: Boone MasterJONES, Ranyah Groeneveld E on: 08/31/2016 08:40 AM   Modules accepted: Orders

## 2016-09-01 ENCOUNTER — Ambulatory Visit (INDEPENDENT_AMBULATORY_CARE_PROVIDER_SITE_OTHER): Payer: Medicare Other | Admitting: Cardiovascular Disease

## 2016-09-01 ENCOUNTER — Telehealth: Payer: Self-pay | Admitting: *Deleted

## 2016-09-01 ENCOUNTER — Encounter: Payer: Self-pay | Admitting: Cardiovascular Disease

## 2016-09-01 ENCOUNTER — Non-Acute Institutional Stay (SKILLED_NURSING_FACILITY): Payer: Medicare Other | Admitting: Family

## 2016-09-01 ENCOUNTER — Encounter: Payer: Self-pay | Admitting: Family

## 2016-09-01 VITALS — BP 131/66 | HR 93 | Ht 64.5 in | Wt 108.0 lb

## 2016-09-01 DIAGNOSIS — J449 Chronic obstructive pulmonary disease, unspecified: Secondary | ICD-10-CM

## 2016-09-01 DIAGNOSIS — E7800 Pure hypercholesterolemia, unspecified: Secondary | ICD-10-CM

## 2016-09-01 DIAGNOSIS — I5032 Chronic diastolic (congestive) heart failure: Secondary | ICD-10-CM | POA: Diagnosis not present

## 2016-09-01 DIAGNOSIS — I6523 Occlusion and stenosis of bilateral carotid arteries: Secondary | ICD-10-CM

## 2016-09-01 DIAGNOSIS — I350 Nonrheumatic aortic (valve) stenosis: Secondary | ICD-10-CM | POA: Diagnosis not present

## 2016-09-01 DIAGNOSIS — I771 Stricture of artery: Secondary | ICD-10-CM

## 2016-09-01 DIAGNOSIS — I714 Abdominal aortic aneurysm, without rupture, unspecified: Secondary | ICD-10-CM

## 2016-09-01 DIAGNOSIS — F411 Generalized anxiety disorder: Secondary | ICD-10-CM

## 2016-09-01 DIAGNOSIS — I10 Essential (primary) hypertension: Secondary | ICD-10-CM | POA: Diagnosis not present

## 2016-09-01 DIAGNOSIS — E0800 Diabetes mellitus due to underlying condition with hyperosmolarity without nonketotic hyperglycemic-hyperosmolar coma (NKHHC): Secondary | ICD-10-CM

## 2016-09-01 DIAGNOSIS — R2681 Unsteadiness on feet: Secondary | ICD-10-CM

## 2016-09-01 MED ORDER — FUROSEMIDE 20 MG PO TABS: 20.0000 mg | ORAL_TABLET | Freq: Every day | ORAL | 11 refills | Status: AC

## 2016-09-01 NOTE — Telephone Encounter (Signed)
Patient seen in the office by Dr Duke Salviaandolph and she needs an AAA US and fasting LP/CMET when she returns for carotid doppler

## 2016-09-01 NOTE — Patient Instructions (Signed)
Medication Instructions:  INCREASE- Lasix 20 mg daily  Labwork: BMP in 1 weeks   Testing/Procedures: None Ordered  Follow-Up: Your physician recommends that you schedule a follow-up appointment in: 3 Months.   Any Other Special Instructions Will Be Listed Below (If Applicable).   If you need a refill on your cardiac medications before your next appointment, please call your pharmacy.

## 2016-09-01 NOTE — Progress Notes (Signed)
Cardiology Office Note   Date:  09/01/2016   ID:  Denise Adams, DOB 24-Aug-1931, MRN [id number]  PCP:  Chilton Greathouse, MD  Cardiologist:   Chilton Si, MD   Chief Complaint  Patient presents with  . Follow-up  . Edema    in ankles.   . Shortness of Breath    occasionally.      History of Present Illness: Denise Adams is a 83 y.o. female with hypertension, hyperlipidemia, severe aortic stenosis, AAA, PAD s/p R iliac stent with in-stent restenosis, carotid stenosis s/p L CEA, COPD on home oxygen and ongoing tobacco abuse who presents for follow up.  Denise Adams was admitted to the hospital 08/2016 with a COPD exacerbation and acute on chronic diastolic heart failure.  She ran out of supplemental oxygen prior to admission.  She had an echo 08/22/16 that revealed LVEF 50-55% with severe aortic stenosis (mean gradient 39 mmHg) and moderate mitral regurgitation.  She followed up with Denise Robinsons, NP of pulmonary on 5/23 and was doing well.  Denise Adams has a known fusiform AAA (2.7 cm x 2.9 cm in 2014)  with significant aortoiliac stenosis.  Her last ultrasound 04/2012 revealed elevated velocities in the right common iliac artery and mildly elevated velocities in the distal left common iliac.  She had ABIs at that time that were normal.  Denise Adams has been doing well.  This AM she got short of breath walking to the bathroom when her oxygen dropped.  She doesn't think that she has been getting her medication as prescribed.  For the last day she noted lower extremity edema.  She denies orthopnea or PND.  Her weight has been stable.  Her breathing has otherwise been stable.  She is looking forward to going home soon.  She is scheduled to have PFTs next month.   Past Medical History:  Diagnosis Date  . Aortic stenosis   . COPD (chronic obstructive pulmonary disease) (HCC)   . Diabetes mellitus   . HLD (hyperlipidemia)   . HTN (hypertension)   . Myocardial infarction (HCC)    . PVD (peripheral vascular disease) (HCC)     Past Surgical History:  Procedure Laterality Date  . ABDOMINAL HYSTERECTOMY    . APPENDECTOMY    . CAROTID ENDARTERECTOMY     Left  . CATARACT EXTRACTION    . FOOT SURGERY     right  . TONSILLECTOMY       Current Outpatient Prescriptions  Medication Sig Dispense Refill  . albuterol (PROVENTIL HFA;VENTOLIN HFA) 108 (90 BASE) MCG/ACT inhaler Inhale 2 puffs into the lungs every 6 (six) hours as needed for shortness of breath.     Marland Kitchen aspirin EC 81 MG tablet Take 81 mg by mouth daily.    . budesonide-formoterol (SYMBICORT) 160-4.5 MCG/ACT inhaler Inhale 2 puffs into the lungs 2 (two) times daily. 1 Inhaler 5  . Cholecalciferol (VITAMIN D) 2000 UNITS CAPS Take 2,000 Units by mouth daily.     . cyanocobalamin 1000 MCG tablet Take 1,000 mcg by mouth daily. Reported on 09/21/2015    . diclofenac sodium (VOLTAREN) 1 % GEL Apply 2 g topically 4 (four) times daily. To left shoulder    . escitalopram (LEXAPRO) 5 MG tablet Take 5 mg by mouth at bedtime.     . furosemide (LASIX) 20 MG tablet Take 1 tablet (20 mg total) by mouth daily. 30 tablet 11  . HYDROcodone-acetaminophen (NORCO/VICODIN) 5-325 MG tablet Take 1 tablet by mouth  every 6 (six) hours.    . insulin aspart (NOVOLOG) 100 UNIT/ML injection Inject 0-9 Units into the skin every 4 (four) hours. Sliding scale insulin Less than 70 initiate hypoglycemia protocol 70-120  0 units 120-150 1 unit 151-200 2 units 201-250 3 units 251-300 5 units 301-350 7 units 351-400 9 units  Greater than 400 call MD 10 mL 11  . ipratropium-albuterol (DUONEB) 0.5-2.5 (3) MG/3ML SOLN Take 3 mLs by nebulization every 4 (four) hours as needed. 360 mL 0  . ipratropium-albuterol (DUONEB) 0.5-2.5 (3) MG/3ML SOLN Take 3 mLs by nebulization 3 (three) times daily.    Denise Adams Oil 1000 MG CAPS Take 1,000 mg by mouth daily.    Marland Kitchen LORazepam (ATIVAN) 0.5 MG tablet Take 1 tablet (0.5 mg total) by mouth every 8 (eight) hours  as needed for anxiety. 10 tablet 0  . methocarbamol (ROBAXIN) 500 MG tablet Take 1 tablet (500 mg total) by mouth every 8 (eight) hours as needed for muscle spasms. 30 tablet 0  . Multiple Vitamins-Minerals (PRESERVISION AREDS PO) Take 1 tablet by mouth 2 (two) times daily.     . ondansetron (ZOFRAN) 4 MG tablet Take 4 mg by mouth every 6 (six) hours as needed. for nausea  1  . pantoprazole (PROTONIX) 40 MG tablet Take 40 mg by mouth every evening.  5  . predniSONE (DELTASONE) 10 MG tablet Prednisone 40 mg po daily x 3 day then Prednisone 30 mg po daily x 3 day then Prednisone 20 mg po daily x 3day then Prednisone 10 mg daily x 3 day then stop...    . rOPINIRole (REQUIP) 0.5 MG tablet Take 1 tablet (0.5 mg total) by mouth at bedtime. 30 tablet 0  . UNABLE TO FIND Med Name: Med pass 120 cc by mouth 2 times daily in between meals     No current facility-administered medications for this visit.     Allergies:   Patient has no known allergies.    Social History:  The patient  reports that she quit smoking about 2 months ago. Her smoking use included Cigarettes. She has a 75.00 pack-year smoking history. She has never used smokeless tobacco. She reports that she does not drink alcohol or use drugs.   Family History:  The patient's family history includes Coronary artery disease in her father; Coronary artery disease (age of onset: 94) in her brother; Coronary artery disease (age of onset: 61) in her brother.    ROS:  Please see the history of present illness.   Otherwise, review of systems are positive for none.   All other systems are reviewed and negative.    PHYSICAL EXAM: VS:  BP 131/66   Pulse 93   Ht 5' 4.5" (1.638 m)   Wt 49 kg (108 lb)   BMI 18.25 kg/m  , BMI Body mass index is 18.25 kg/m. GENERAL:  Frail.  Chronically ill-appearing HEENT:  Pupils equal round and reactive, fundi not visualized, oral mucosa unremarkable NECK:  No jugular venous distention, waveform within normal  limits, carotid upstroke brisk and symmetric, no bruits, no thyromegaly LYMPHATICS:  No cervical adenopathy LUNGS:  Diminished breath sounds.  No crackles, wheezes or rhonchi.   HEART:  RRR.  PMI not displaced or sustained,S1 and S2 within normal limits, no S3, no S4, no clicks, no rubs, III/VI late-peaking systolic murmur at the LUSB ABD:  Flat, positive bowel sounds normal in frequency in pitch, no bruits, no rebound, no guarding, no midline pulsatile mass, no  hepatomegaly, no splenomegaly EXT:  2 plus pulses throughout, 1+ pitting edema to above the ankle bilaterally, no cyanosis no clubbing SKIN:  No rashes no nodules NEURO:  Cranial nerves II through XII grossly intact, motor grossly intact throughout PSYCH:  Cognitively intact, oriented to person place and time   EKG:  EKG is not ordered today. The ekg ordered 08/20/16 demonstrates sinus tachycardia rate 132 bpm.  LVH with repolarization abnormalities  Echo 08/22/16: Study Conclusions  - Left ventricle: The cavity size was normal. Wall thickness was normal. Systolic function was normal. The estimated ejection fraction was in the range of 50% to 55%. Basal inferior hypokinesis. The study is not technically sufficient to allow evaluation of LV diastolic function. - Aortic valve: Calcified with leaflet restriction. There is severe stenosis. No significant regurgitation. Mean gradient (S): 39 mm Hg. Peak gradient (S): 68 mm Hg. Valve area (Vmax): 0.67 cm^2. Valve area (Vmean): 0.6 cm^2. - Mitral valve: Sclerotic and tethered posterior leaflet. There is moderate posteriorly-directed mitral regurgitation. Calcified annulus. - Left atrium: Moderately dilated. - Tricuspid valve: There was mild regurgitation. - Pulmonary arteries: PA peak pressure: 27 mm Hg (S). - Systemic veins: The IVC measures <2.1 cm, but does not collapse >50%, suggesting an elevated RA pressure of 8 mmHg. - Pericardium, extracardiac: A trivial  pericardial effusion was identified posterior to the heart. Features were not consistent with tamponade physiology.  Carotid Doppler 09/21/15: <40% R ICA stenosis.  S/p L CEA with no stenosis  ABI /1/14:  Right 0.97, left 1.0  Lexiscan Myoview 08/10/10: No ischemia   Recent Labs: 06/20/2016: TSH 1.374 08/20/2016: B Natriuretic Peptide 2,074.8 08/23/2016: Magnesium 1.8 08/29/2016: ALT 25; BUN 20; Creatinine 0.6; Hemoglobin 10.2; Platelets 435; Potassium 4.1; Sodium 143    Lipid Panel    Component Value Date/Time   CHOL 151 02/20/2011 0841   TRIG 81.0 02/20/2011 0841   HDL 60.40 02/20/2011 0841   CHOLHDL 3 02/20/2011 0841   VLDL 16.2 02/20/2011 0841   LDLCALC 74 02/20/2011 0841   LDLDIRECT 133.8 01/04/2011 0827      Wt Readings from Last 3 Encounters:  09/01/16 49 kg (108 lb)  09/01/16 45.2 kg (99 lb 9.6 oz)  08/30/16 47.5 kg (104 lb 12.8 oz)      ASSESSMENT AND PLAN:  # Severe aortic stenosis: Ms. Theilen has severe aortic stenosis.  She is short of breath, though this is likely multifactorial from her COPD.  She has PFTs pending to better assess the severity of her disease.  She is scheduled to see Dr. Sanjuana Kava 6/4.  We will reschedule this for after her PFTs. She also has iliac disease s/p prior stenting.  She is quite frail and understands that she may not be a candidate for TAVR.  The procedure would be more risky if she is not a candidate for an inguinal approach.  She has mild edema on exam.  We will increase lasix to 20mg  daily from every other day.  Check BMP in 1 week.   # Hypertension: BP reasonably well-controlled.  Increasing lasix as above.   # Abdominal aortic aneurysm: Ms. Selk has a known AAA and bilateral iliac stenosis.  She has a stent in the R common iliac.  We will repeat her abdominal ultrasound.  ABIs were normal when last checked in 2014.   # Hyperlipidemia: No lipids since 2012.  She should be on a statin.  When she comes for her CMP in one week  we will check lipids  Current medicines are reviewed at length with the patient today.  The patient does not have concerns regarding medicines.  The following changes have been made:  no change  Labs/ tests ordered today include:   Orders Placed This Encounter  Procedures  . Basic metabolic panel     Disposition:   FU with Raylinn Kosar C. Duke Salviaandolph, MD, [facility] in 3 months.     This note was written with the assistance of speech recognition software.  Please excuse any transcriptional errors.  Signed, Heidie Krall C. Duke Salviaandolph, MD, [facility]  09/01/2016 11:22 AM    [facility]

## 2016-09-01 NOTE — Progress Notes (Addendum)
Location:  [facility] Room Number: 407P Place of Service:  SNF (31)  Provider: Richarda Blade FNP-C   PCP: Chilton Greathouse, MD Patient Care Team: Chilton Greathouse, MD as PCP - General (Internal Medicine) Lewayne Bunting, MD as Consulting Physician (Cardiology) Ernesto Rutherford, MD as Consulting Physician (Ophthalmology) Saporito, Fanny Dance, LCSW as [facility]  Extended Emergency Contact Information Primary Emergency Contact: Isley,Deborah Address: [address]          [place], [place] [address] [place] Home Phone: [phone] Mobile Phone: [phone] Relation: Daughter Secondary Emergency Contact: [place] Home Phone: [phone] Mobile Phone: [phone] Relation: Son  Code Status: DNR Goals of care:  Advanced Directive information Advanced Directives 09/01/2016  Does Patient Have a Medical Advance Directive? Yes  Type of Advance Directive Out of facility DNR (pink MOST or yellow form)  Does patient want to make changes to medical advance directive? -  Would patient like information on creating a medical advance directive? -  Pre-existing out of facility DNR order (yellow form or pink MOST form) Yellow form placed in chart (order not valid for inpatient use)     No Known Allergies  Chief Complaint  Patient presents with  . Discharge Note    discharge from [facility]    HPI:  83 y.o. female seen today at [facility] for discharge home. She was here for short term rehabilitation for post hospital admission from 08/19/2016-08/24/2016 with acute respiratory failure with COPD exacerbation and acute on chronic diastolic CHF exacerbation.She was intubated, required steroids, diuresis and antibiotics. She has past medical history of severe aortic stenosis, COPD, chronic diastolic CHF, DM, HTN, AAA,PVD,Hyperlipidemia among other  conditions.She is seen in her room today with family at bedside.She recent had chest X-ray done due to leukocytosis. X-ray was negative for acute abnormalities but COPD noted. U/A and C/S also negative for UTI.She also had left shoulder pain evaluated by PMR specialist. Left shoulder X-ray showed rotator cuff.PMR recommended follow up with outpatient Ortho.she had a follow up appointment 08/30/2016 with [facility] no changes in medication. She will follow up with Pulmonary in 6 weeks for PFT's. She also has post hospital follow up appointment today with Cardiology Dr.Radndolph at  [facility].       She has worked well with PT/OT now stable for discharge home.She will be discharged home with outpatient  PT/OT at [facility]  to continue with ROM, Exercise, Gait stability and muscle strengthening. She does not require any DME states has own rollator and oxygen concentrator at home. Home health services will be arranged by facility social worker prior to discharge. Prescription medication will be written x 1 month then patient to follow up with PCP in 1-2 weeks. Facility staff report no new concerns.      Past Medical History:  Diagnosis Date  . Aortic stenosis   . COPD (chronic obstructive pulmonary disease) (HCC)   . Diabetes mellitus   . HLD (hyperlipidemia)   . HTN (hypertension)   . Myocardial infarction (HCC)   . PVD (peripheral vascular disease) (HCC)     Past Surgical History:  Procedure Laterality Date  . ABDOMINAL HYSTERECTOMY    . APPENDECTOMY    . CAROTID ENDARTERECTOMY     Left  . CATARACT EXTRACTION    . FOOT SURGERY     right  . TONSILLECTOMY        reports  that she quit smoking about 2 months ago. Her smoking use included Cigarettes. She has a 75.00 pack-year smoking history. She has never used smokeless tobacco. She reports that she does not drink alcohol or use drugs. Social History   Social History  . Marital status: Widowed    Spouse name: N/A  .  Number of children: N/A  . Years of education: N/A   Occupational History  . Retired    Social History Main Topics  . Smoking status: Former Smoker    Packs/day: 1.25    Years: 60.00    Types: Cigarettes    Quit date: 06/19/2016  . Smokeless tobacco: Never Used  . Alcohol use No  . Drug use: No  . Sexual activity: Not on file   Other Topics Concern  . Not on file   Social History Narrative  . No narrative on file   No Known Allergies  Pertinent  Health Maintenance Due  Topic Date Due  . HEMOGLOBIN A1C  1931/10/07  . FOOT EXAM  11/24/1943  . OPHTHALMOLOGY EXAM  11/24/1943  . URINE MICROALBUMIN  11/24/1943  . DEXA SCAN  11/24/1998  . PNA vac Low Risk Adult (1 of 2 - PCV13) 11/24/1998  . INFLUENZA VACCINE  11/08/2016    Medications: Allergies as of 09/01/2016   No Known Allergies     Medication List       Accurate as of 09/01/16  5:41 PM. Always use your most recent med list.          albuterol 108 (90 Base) MCG/ACT inhaler Commonly known as:  PROVENTIL HFA;VENTOLIN HFA Inhale 2 puffs into the lungs every 6 (six) hours as needed for shortness of breath.   aspirin EC 81 MG tablet Take 81 mg by mouth daily.   budesonide-formoterol 160-4.5 MCG/ACT inhaler Commonly known as:  SYMBICORT Inhale 2 puffs into the lungs 2 (two) times daily.   cyanocobalamin 1000 MCG tablet Take 1,000 mcg by mouth daily. Reported on 09/21/2015   diclofenac sodium 1 % Gel Commonly known as:  VOLTAREN Apply 2 g topically 4 (four) times daily. To left shoulder   escitalopram 5 MG tablet Commonly known as:  LEXAPRO Take 5 mg by mouth at bedtime.   furosemide 20 MG tablet Commonly known as:  LASIX Take 1 tablet (20 mg total) by mouth daily.   HYDROcodone-acetaminophen 5-325 MG tablet Commonly known as:  NORCO/VICODIN Take 1 tablet by mouth every 6 (six) hours.   insulin aspart 100 UNIT/ML injection Commonly known as:  novoLOG Inject 0-9 Units into the skin every 4 (four)  hours. Sliding scale insulin Less than 70 initiate hypoglycemia protocol 70-120  0 units 120-150 1 unit 151-200 2 units 201-250 3 units 251-300 5 units 301-350 7 units 351-400 9 units  Greater than 400 call MD   ipratropium-albuterol 0.5-2.5 (3) MG/3ML Soln Commonly known as:  DUONEB Take 3 mLs by nebulization every 4 (four) hours as needed.   ipratropium-albuterol 0.5-2.5 (3) MG/3ML Soln Commonly known as:  DUONEB Take 3 mLs by nebulization 3 (three) times daily.   Krill Oil 1000 MG Caps Take 1,000 mg by mouth daily.   LORazepam 0.5 MG tablet Commonly known as:  ATIVAN Take 1 tablet (0.5 mg total) by mouth every 8 (eight) hours as needed for anxiety.   methocarbamol 500 MG tablet Commonly known as:  ROBAXIN Take 1 tablet (500 mg total) by mouth every 8 (eight) hours as needed for muscle spasms.   ondansetron 4 MG  tablet Commonly known as:  ZOFRAN Take 4 mg by mouth every 6 (six) hours as needed. for nausea   pantoprazole 40 MG tablet Commonly known as:  PROTONIX Take 40 mg by mouth every evening.   predniSONE 10 MG tablet Commonly known as:  DELTASONE Prednisone 40 mg po daily x 3 day then Prednisone 30 mg po daily x 3 day then Prednisone 20 mg po daily x 3day then Prednisone 10 mg daily x 3 day then stop...   PRESERVISION AREDS PO Take 1 tablet by mouth 2 (two) times daily.   rOPINIRole 0.5 MG tablet Commonly known as:  REQUIP Take 1 tablet (0.5 mg total) by mouth at bedtime.   UNABLE TO FIND Med Name: Med pass 120 cc by mouth 2 times daily in between meals   Vitamin D 2000 units Caps Take 2,000 Units by mouth daily.       Review of Systems  Constitutional: Negative for activity change, appetite change, chills, fatigue and fever.  HENT: Negative for congestion, rhinorrhea, sinus pain, sinus pressure, sneezing and sore throat.   Eyes: Negative.   Respiratory: Negative for cough, chest tightness, shortness of breath and wheezing.   Cardiovascular: Positive for  leg swelling. Negative for chest pain and palpitations.  Gastrointestinal: Negative for abdominal distention, abdominal pain, constipation, diarrhea, nausea and vomiting.  Endocrine: Negative.   Genitourinary: Negative for dysuria, flank pain, frequency and urgency.  Musculoskeletal: Positive for gait problem.  Skin: Negative for color change, pallor and rash.  Neurological: Negative for dizziness, seizures, syncope, light-headedness and headaches.  Hematological: Does not bruise/bleed easily.  Psychiatric/Behavioral: Negative for agitation, confusion, hallucinations and sleep disturbance. The patient is not nervous/anxious.     Vitals:   09/01/16 1025  BP: (!) 147/63  Pulse: 87  Temp: 98.2 F (36.8 C)  SpO2: 97%  Weight: 99 lb 9.6 oz (45.2 kg)  Height: 5' 4.5" (1.638 m)   Body mass index is 16.83 kg/m. Physical Exam  Constitutional: She is oriented to person, place, and time.  Thin frail elderly in no acute distress  HENT:  Head: Normocephalic.  Mouth/Throat: Oropharynx is clear and moist. No oropharyngeal exudate.  Eyes: Conjunctivae and EOM are normal. Pupils are equal, round, and reactive to light. Right eye exhibits no discharge. Left eye exhibits no discharge. No scleral icterus.  Neck: Normal range of motion. No JVD present. No thyromegaly present.  Cardiovascular: Normal rate, regular rhythm, normal heart sounds and intact distal pulses.  Exam reveals no gallop and no friction rub.   No murmur heard. Pulmonary/Chest: Effort normal and breath sounds normal. No respiratory distress. She has no wheezes. She has no rales.  Oxygen 2 Liters [place]  Abdominal: Soft. Bowel sounds are normal. She exhibits no distension. There is no tenderness. There is no guarding.  Genitourinary:  Genitourinary Comments: Continent   Musculoskeletal: She exhibits no tenderness or deformity.  Unsteady gait. Limited ROM to left shoulder due to pain. Bilateral lower extremities trace -1+ edema.     Lymphadenopathy:    She has no cervical adenopathy.  Neurological: She is oriented to person, place, and time.  Skin: Skin is warm and dry. No rash noted. No erythema. No pallor.  Psychiatric: She has a normal mood and affect.    Labs reviewed: Basic Metabolic Panel:  Recent Labs  16/01/9604/13/18 0306 08/21/16 0545 08/22/16 0552 08/23/16 0720 08/24/16 0441 08/29/16  NA 137 140 141 139 140 143  K 4.1 3.5 4.2 3.9 3.7 4.1  CL 105  101 101 98* 99*  --   CO2 24 28 31  35* 34*  --   GLUCOSE 197* 94 95 98 82  --   BUN 20 31* 30* 21* 19 20  CREATININE 0.95 1.25* 0.93 0.71 0.77 0.6  CALCIUM 8.1* 8.4* 8.9 9.1 9.0  --   MG 1.5* 2.5*  --  1.8  --   --   PHOS 5.2* 4.6  --  2.6  --   --    Liver Function Tests:  Recent Labs  05/12/16 0650 06/19/16 1920 08/21/16 0545 08/29/16  AST 23 30 27 18   ALT 14 19 19 25   ALKPHOS 65 83 62 62  BILITOT 1.4* 0.9 0.5  --   PROT 5.3* 5.7* 5.5*  --   ALBUMIN 2.5* 3.2* 2.9*  --    CBC:  Recent Labs  05/11/16 1647  06/19/16 1920  08/20/16 0013  08/22/16 0552 08/23/16 0720 08/24/16 0441 08/29/16  WBC 23.5*  < > 29.0*  < > 20.6*  < > 12.8* 12.6* 11.1* 12.7  NEUTROABS 21.3*  --  25.8*  --  11.8*  --   --   --   --   --   HGB 12.2  < > 11.3*  < > 11.1*  < > 9.4* 10.1* 9.4* 10.2*  HCT 35.8*  < > 33.9*  < > 35.8*  < > 30.1* 33.1* 30.5* 32*  MCV 96.2  < > 98.0  < > 105.9*  < > 103.1* 103.8* 102.3*  --   PLT 366  < > 378  < > 319  < > 300 367 331 435*  < > = values in this interval not displayed. Cardiac Enzymes:  Recent Labs  08/20/16 1803 08/20/16 2358 08/21/16 0545  TROPONINI 0.13* 0.13* 0.14*    Recent Labs  08/24/16 0356 08/24/16 0738 08/24/16 1127  GLUCAP 74 121* 130*   Assessment/Plan:   1. Unsteady gait  Has worked well with PT/ OT.She will be discharged home with outpatient  PT/OT to at [facility] continue with ROM, Exercise, Gait stability and muscle strengthening. She does not require any DME states has own  rollator and oxygen concentrator at home. Fall and safety precautions.  2. Chronic obstructive pulmonary disease, unspecified COPD type Breathing stable.continue on oxygen via nasal cannula Continue on Duoneb three times daily, Symbicort twice daily and albuterol every 6 hours as needed.Follow up with Pulmonary in 6 weeks for PFT's as directed.CBC/diff in 1-2 weeks with PCP   3. Severe aortic stenosis Continue to follow up with Cardiology Dr[facility]Radndolph at  [facility].        4. Generalized anxiety disorder Stable. Continue on lorazepam and escitalopram.    5. Diabetes mellitus due to underlying condition with hyperosmolarity without coma, without long-term current use of insulin  CBG log reviewed ranging in the 120's-200's. Continue on Novolog per sliding scale. Monitor Hgb A1C. Continue on ASA.   6. CHF No recent abrupt weight gain. Trace-1+ edema to lower extremities. Continue on Furosemide. Continue fluid restrictions and NAS diet. Continue to monitor weight. Follow up with cardiology today as directed.  Patient is being discharged with the following home health services:   -outpatient  PT/OT at [facility] for ROM, exercise, gait stability and muscle strengthening  Patient is being discharged with the following durable medical equipment:   - None required has own Rollator and oxygen concentrator at home.  Patient has been advised to f/u with their PCP in  1-2 weeks to for a transitions of care visit.Social services at their facility was responsible for arranging this appointment.  Pt was provided with adequate prescriptions of noncontrolled medications to reach the scheduled appointment.For controlled substances, a limited supply was provided as appropriate for the individual patient. If the pt normally receives these medications from a pain clinic or has a contract with another physician, these medications should be received from that clinic or physician only).  Future  labs/tests needed:  CBC, BMP in 1-2 weeks PCP

## 2016-09-05 NOTE — Telephone Encounter (Signed)
Needs follow up scheduled when she calls back

## 2016-09-05 NOTE — Progress Notes (Signed)
Note reviewed.  Kaelee Pfeffer E. Terea Neubauer, M.D. [facility] Pager:  [phone] After 3pm or if no response, call [phone] 7:41 AM 09/05/16    

## 2016-09-06 NOTE — Telephone Encounter (Signed)
Pt had HFU with [facility] on 08/30/16. Nothing further needed.

## 2016-09-08 ENCOUNTER — Encounter: Payer: Self-pay | Admitting: *Deleted

## 2016-09-08 ENCOUNTER — Other Ambulatory Visit: Payer: Self-pay | Admitting: *Deleted

## 2016-09-08 NOTE — Patient Outreach (Signed)
[facility] ([facility] Care Management  09/08/2016  Denise Adams 12/15/1931 [id number]   CSW was able to make contact with patient's daughter, Deborah Isley today, after driving out to [facility] where patient was residing to receive short-term rehabilitative services, only to find that patient had been discharged. In talking with Mrs. Isley, CSW learned that patient's health and condition are terminal and that patient's prognosis is very poor.  Mrs. Isley admitted that she is awaiting a return call from the Hospice & Palliative Care Nurse to call her back regarding patient's breathing.  Mrs. Isley reported that patient is trying to breath through her mouth, but is gasping for air, and this is while on 5 liters of oxygen per nasal cannula.  CSW offered to call the Hospice Nurse on Mrs. Isley's behalf, but Mrs. Isley politely declined.  CSW offered counseling, supportive services and condolences, encouraging Mrs. Isley to contact CSW directly if additional social work services and/or assistance can be offered in the near future.  Mrs. Isley voiced understanding and was agreeable to this plan. CSW will perform a case closure on patient, as all goals of treatment have been met from social work standpoint and no additional social work needs have been identified at this time.  CSW will fax an update to patient's Primary Care Physician, Dr. Ravisankar Avva to ensure that they are aware of CSW's involvement with patient's plan of care.  CSW will submit a case closure request to Lavelda Comer, Care Management Assistant with [facility], in the form of an In Basket message.   Joanna Saporito, BSW, MSW, LCSW  Licensed Clinical Social Worker  [facility]  Mailing Address-[address], [place], [place] [address] Physical Address-[address], [place], [place]  [address] Toll Free Main # [phone] Fax # [phone] Cell # [phone]  Office # [phone] [email or web address]        

## 2016-09-11 ENCOUNTER — Institutional Professional Consult (permissible substitution): Payer: Medicare Other | Admitting: Cardiovascular Disease

## 2016-09-13 NOTE — Telephone Encounter (Signed)
Patients son called and cancelled carotid per appt. desk   Patient (Pt put in hospice, not expected to make  it. per son)  Will forward to Dr Duke Salviaandolph for information only

## 2016-09-18 ENCOUNTER — Encounter: Payer: Self-pay | Admitting: Family

## 2016-09-25 ENCOUNTER — Ambulatory Visit (HOSPITAL_COMMUNITY): Payer: Medicare Other

## 2016-09-25 ENCOUNTER — Ambulatory Visit: Payer: Medicare Other | Admitting: Family

## 2016-10-02 ENCOUNTER — Ambulatory Visit: Payer: Medicare Other | Admitting: Cardiology

## 2016-10-08 DEATH — deceased

## 2016-11-03 ENCOUNTER — Institutional Professional Consult (permissible substitution): Payer: Medicare Other | Admitting: Cardiovascular Disease

## 2016-12-04 ENCOUNTER — Ambulatory Visit: Payer: Medicare Other | Admitting: Pulmonary Disease

## 2017-05-23 IMAGING — CR DG CHEST 2V
4 series · 4 of 4 positions shown · non-contrast
Comparison: 05/25/2009

CLINICAL DATA: Nausea and cough

EXAM:
CHEST  2 VIEW

[chest lat (1 of 2)]
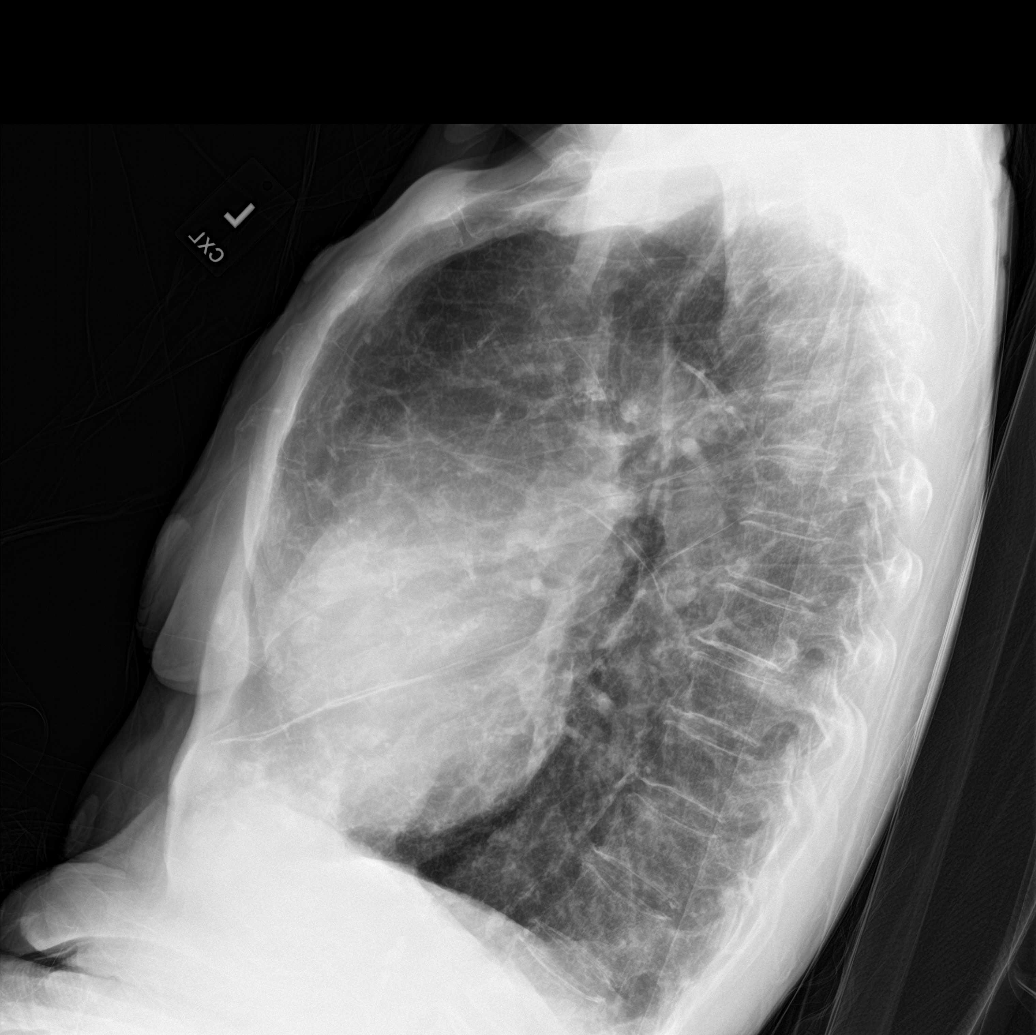

[chest ap (1 of 2)]
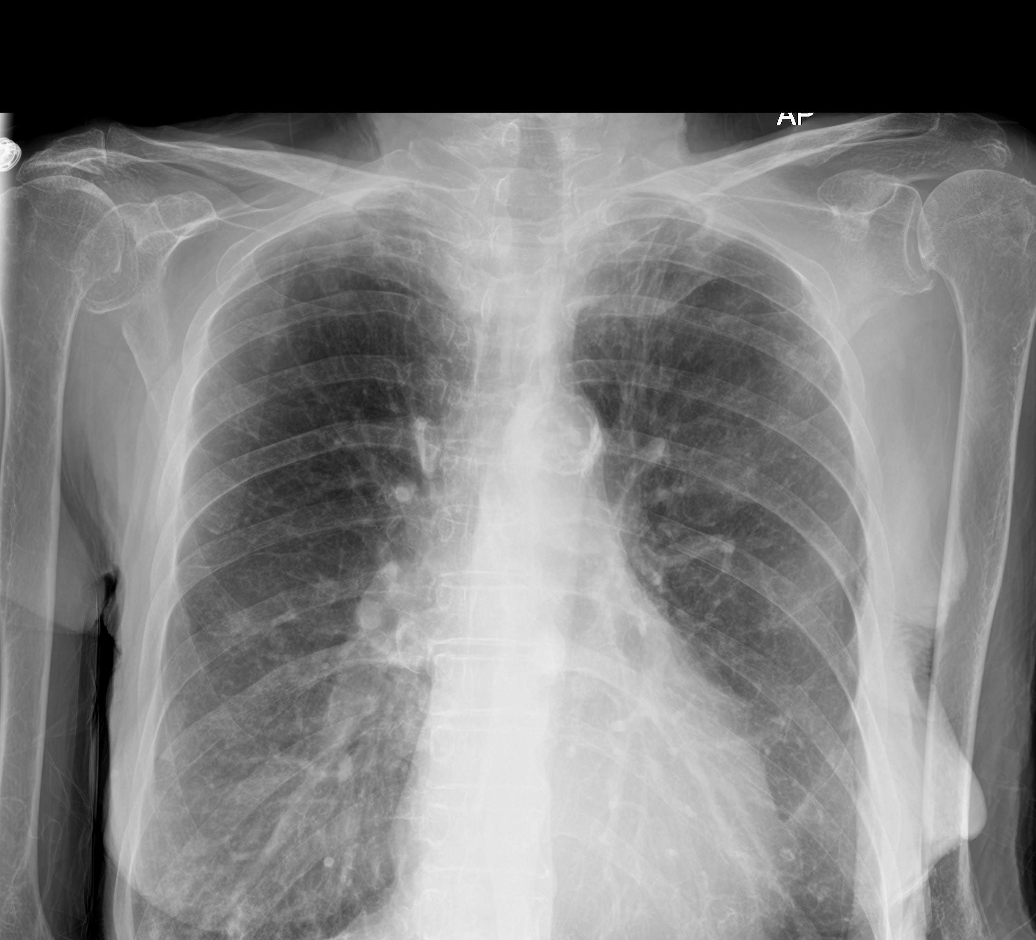

[chest lat (2 of 2)]
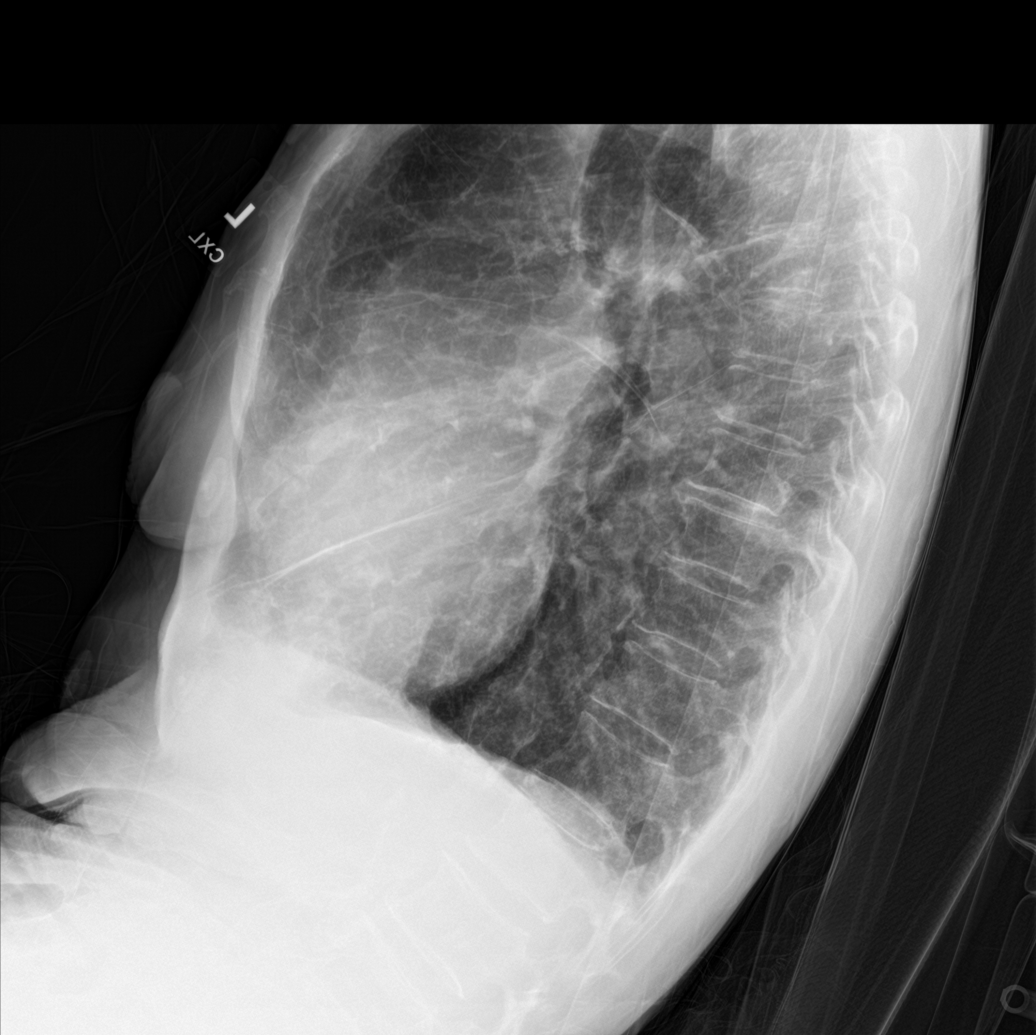

[chest ap (2 of 2)]
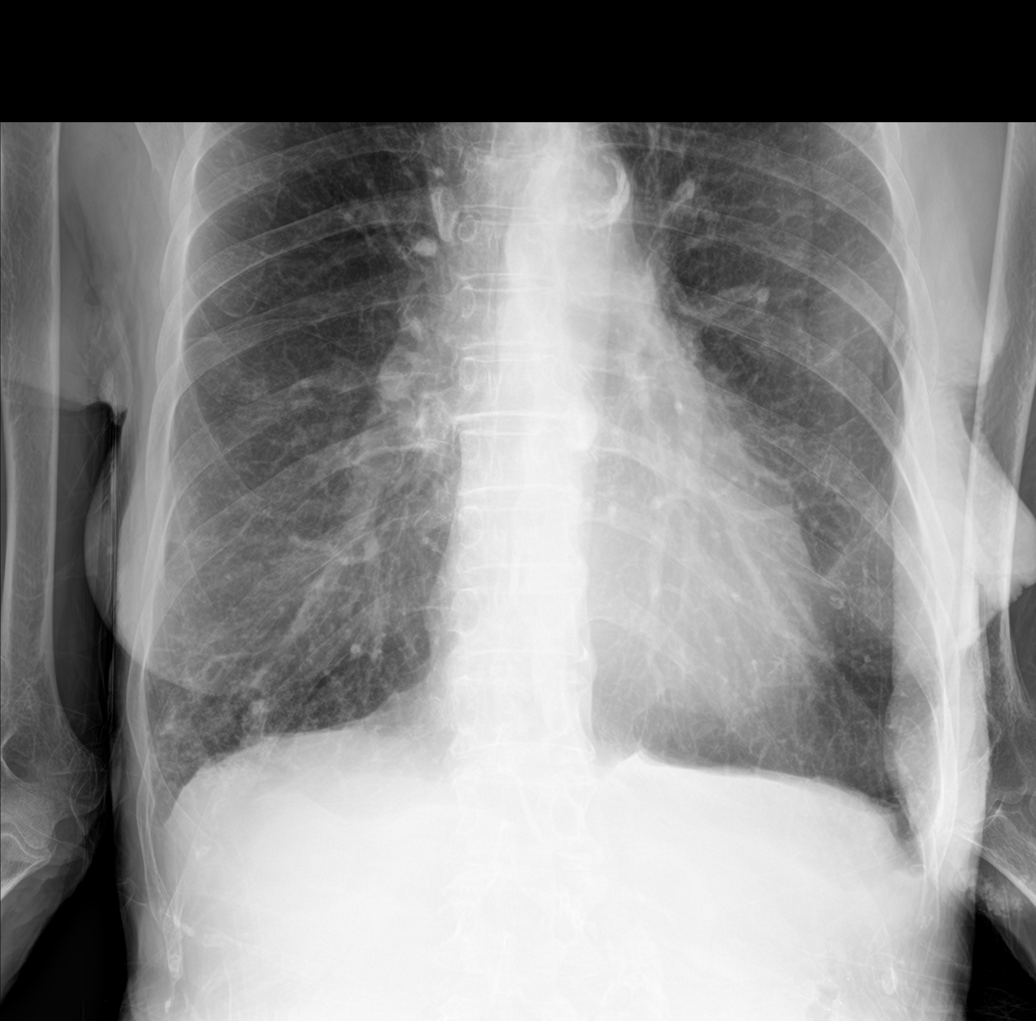

[4 of 4 positions shown; findings below may reference images not displayed]

FINDINGS: Marked hyperinflation and mild cardiomegaly, unchanged. Diffuse
emphysematous and fibrotic appearing changes are present throughout
both lungs. No airspace consolidation. No effusion. Normal pulmonary
vasculature. Hilar and mediastinal contours are unremarkable and
unchanged.
IMPRESSION: Severe chronic changes, without evidence of a superimposed acute
cardiopulmonary process

## 2017-05-24 IMAGING — CR DG HIP (WITH OR WITHOUT PELVIS) 2-3V*R*
3 series · 3 of 3 positions shown · non-contrast
Comparison: None.

CLINICAL DATA: Right hip pain for 3 days, no known injury, initial
encounter

EXAM:
DG HIP (WITH OR WITHOUT PELVIS) 3V RIGHT

[pelvis ap]
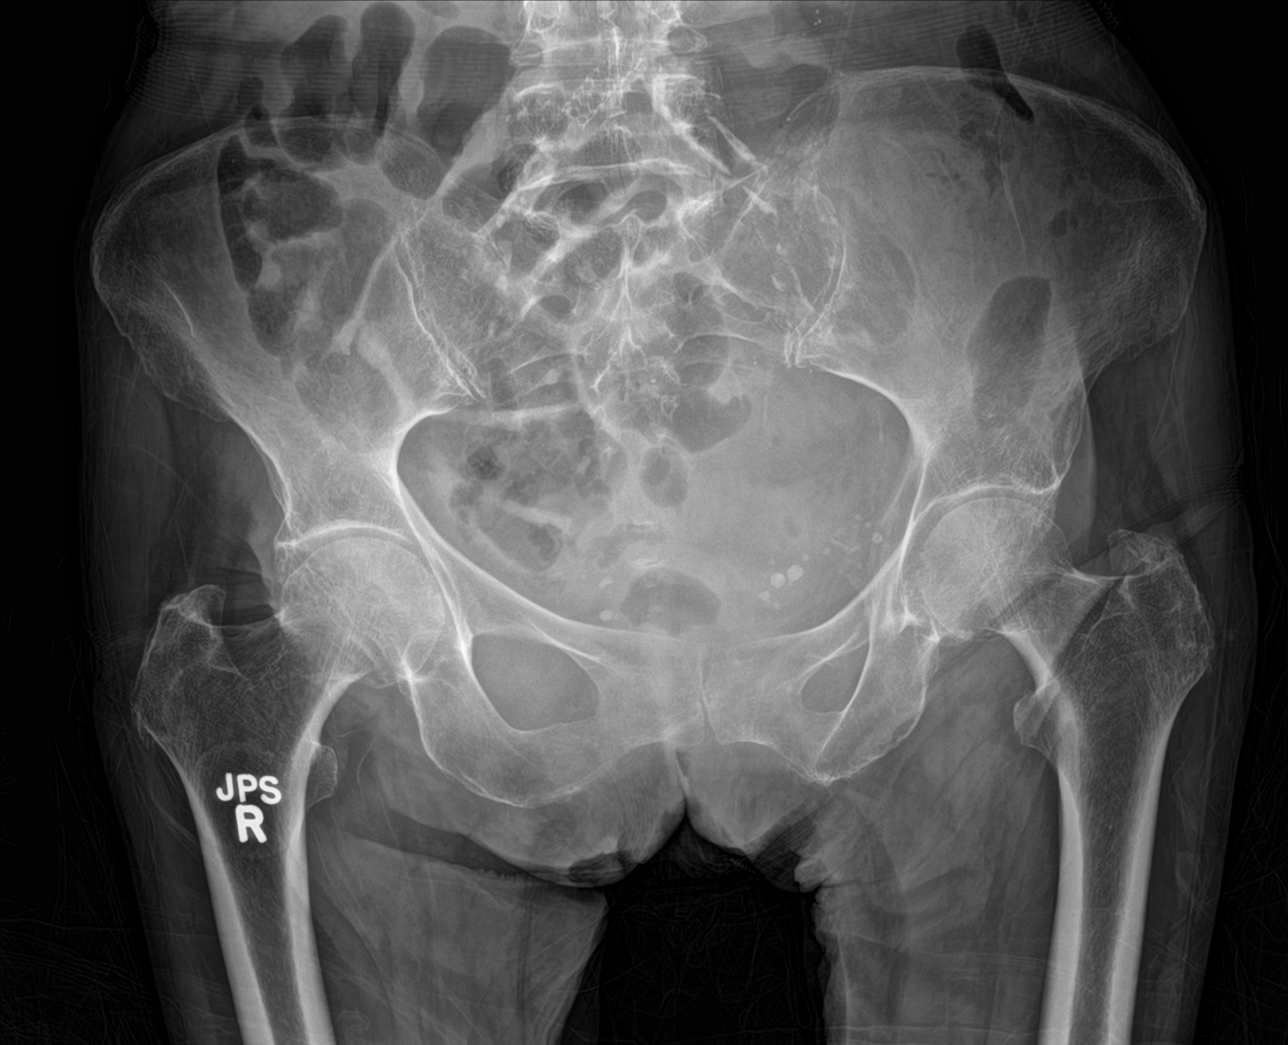

[hip ap]
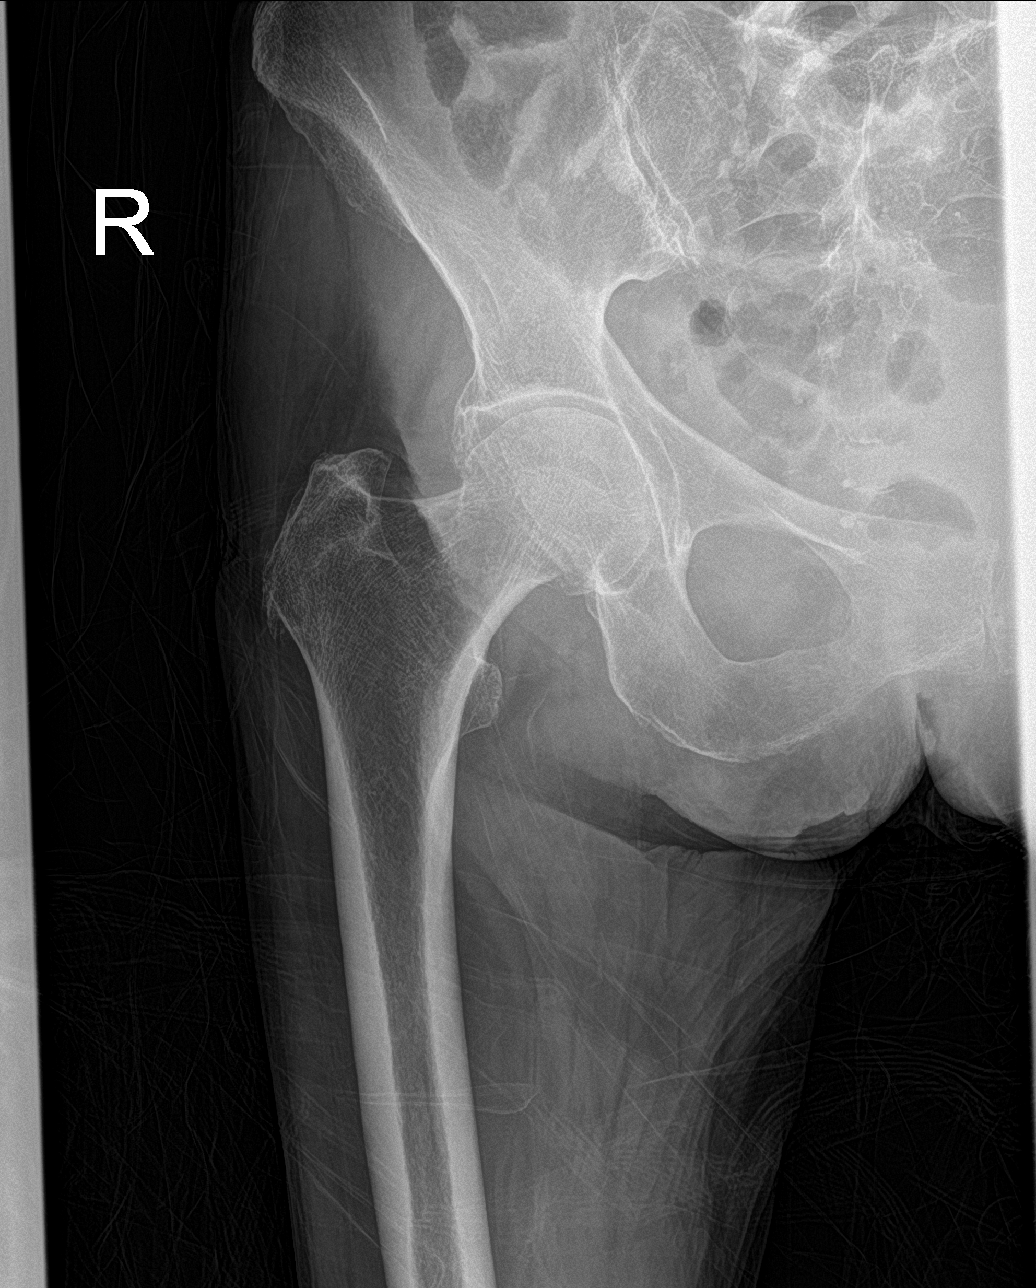

[hip lat]
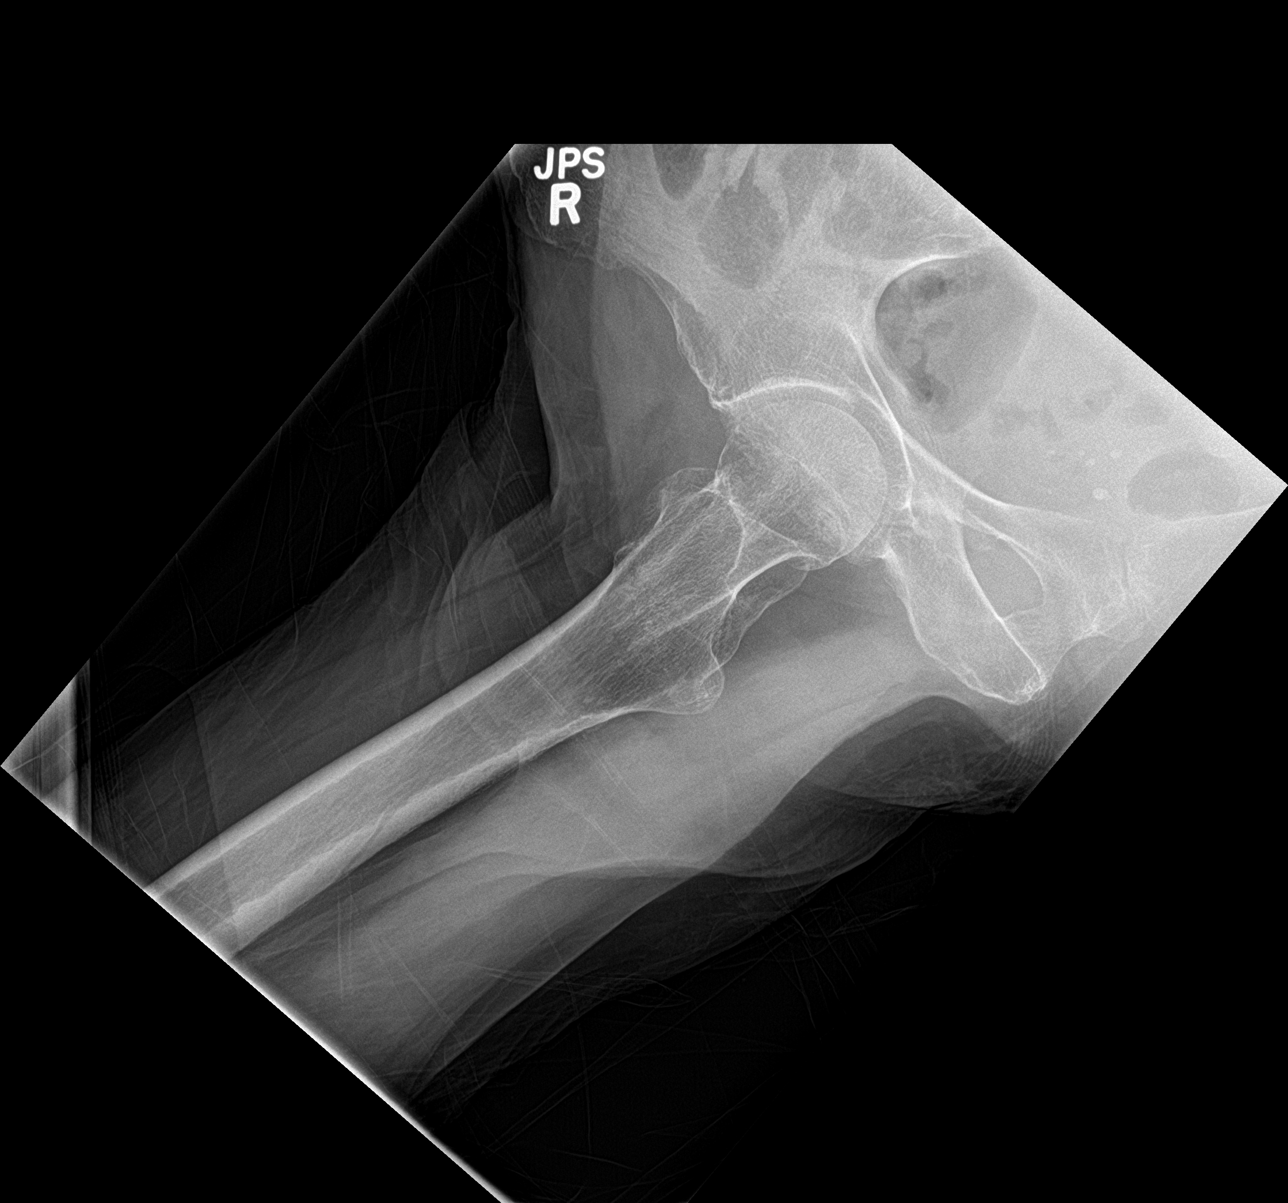

[3 of 3 positions shown; findings below may reference images not displayed]

FINDINGS: Pelvic ring is intact. No acute fracture is seen. Mild degenerative
changes of the hip joints are noted bilaterally. No soft tissue
abnormality is noted. Prior right iliac stenting is noted.
IMPRESSION: Degenerative change without acute abnormality.
